# Patient Record
Sex: Female | Born: 1945
Health system: Southern US, Community
[De-identification: ages and names within clinical notes are randomized; demographics above are authoritative.]

## PROBLEM LIST (undated history)

## (undated) DIAGNOSIS — D61818 Other pancytopenia: Secondary | ICD-10-CM

## (undated) DIAGNOSIS — K219 Gastro-esophageal reflux disease without esophagitis: Secondary | ICD-10-CM

## (undated) DIAGNOSIS — R112 Nausea with vomiting, unspecified: Secondary | ICD-10-CM

## (undated) DIAGNOSIS — Z9889 Other specified postprocedural states: Secondary | ICD-10-CM

## (undated) DIAGNOSIS — M1990 Unspecified osteoarthritis, unspecified site: Secondary | ICD-10-CM

## (undated) DIAGNOSIS — R51 Headache: Secondary | ICD-10-CM

## (undated) DIAGNOSIS — N393 Stress incontinence (female) (male): Secondary | ICD-10-CM

## (undated) DIAGNOSIS — R519 Headache, unspecified: Secondary | ICD-10-CM

## (undated) DIAGNOSIS — R42 Dizziness and giddiness: Secondary | ICD-10-CM

## (undated) HISTORY — PX: TOTAL HIP ARTHROPLASTY: SHX124

## (undated) HISTORY — PX: CARPAL TUNNEL RELEASE: SHX101

## (undated) HISTORY — DX: Dizziness and giddiness: R42

## (undated) HISTORY — DX: Gastro-esophageal reflux disease without esophagitis: K21.9

## (undated) HISTORY — PX: CHOLECYSTECTOMY: SHX55

## (undated) HISTORY — DX: Other pancytopenia: D61.818

## (undated) HISTORY — DX: Unspecified osteoarthritis, unspecified site: M19.90

---

## 2011-12-01 DIAGNOSIS — Z1211 Encounter for screening for malignant neoplasm of colon: Secondary | ICD-10-CM | POA: Diagnosis not present

## 2011-12-07 DIAGNOSIS — E559 Vitamin D deficiency, unspecified: Secondary | ICD-10-CM | POA: Diagnosis not present

## 2011-12-07 DIAGNOSIS — R079 Chest pain, unspecified: Secondary | ICD-10-CM | POA: Diagnosis not present

## 2011-12-07 DIAGNOSIS — R002 Palpitations: Secondary | ICD-10-CM | POA: Diagnosis not present

## 2011-12-07 DIAGNOSIS — D509 Iron deficiency anemia, unspecified: Secondary | ICD-10-CM | POA: Diagnosis not present

## 2011-12-14 DIAGNOSIS — E559 Vitamin D deficiency, unspecified: Secondary | ICD-10-CM | POA: Diagnosis not present

## 2011-12-14 DIAGNOSIS — D509 Iron deficiency anemia, unspecified: Secondary | ICD-10-CM | POA: Diagnosis not present

## 2011-12-14 DIAGNOSIS — R7309 Other abnormal glucose: Secondary | ICD-10-CM | POA: Diagnosis not present

## 2011-12-14 DIAGNOSIS — R5383 Other fatigue: Secondary | ICD-10-CM | POA: Diagnosis not present

## 2011-12-21 DIAGNOSIS — M899 Disorder of bone, unspecified: Secondary | ICD-10-CM | POA: Diagnosis not present

## 2011-12-21 DIAGNOSIS — R32 Unspecified urinary incontinence: Secondary | ICD-10-CM | POA: Diagnosis not present

## 2011-12-21 DIAGNOSIS — M069 Rheumatoid arthritis, unspecified: Secondary | ICD-10-CM | POA: Diagnosis not present

## 2011-12-21 DIAGNOSIS — E559 Vitamin D deficiency, unspecified: Secondary | ICD-10-CM | POA: Diagnosis not present

## 2011-12-22 DIAGNOSIS — M069 Rheumatoid arthritis, unspecified: Secondary | ICD-10-CM | POA: Diagnosis not present

## 2011-12-22 DIAGNOSIS — M810 Age-related osteoporosis without current pathological fracture: Secondary | ICD-10-CM | POA: Diagnosis not present

## 2012-03-15 DIAGNOSIS — Z79899 Other long term (current) drug therapy: Secondary | ICD-10-CM | POA: Diagnosis not present

## 2012-03-15 DIAGNOSIS — M069 Rheumatoid arthritis, unspecified: Secondary | ICD-10-CM | POA: Diagnosis not present

## 2012-03-15 DIAGNOSIS — M810 Age-related osteoporosis without current pathological fracture: Secondary | ICD-10-CM | POA: Diagnosis not present

## 2012-03-15 DIAGNOSIS — K769 Liver disease, unspecified: Secondary | ICD-10-CM | POA: Diagnosis not present

## 2012-04-19 DIAGNOSIS — Z79899 Other long term (current) drug therapy: Secondary | ICD-10-CM | POA: Diagnosis not present

## 2012-04-19 DIAGNOSIS — D509 Iron deficiency anemia, unspecified: Secondary | ICD-10-CM | POA: Diagnosis not present

## 2012-04-19 DIAGNOSIS — E7210 Disorders of sulfur-bearing amino-acid metabolism, unspecified: Secondary | ICD-10-CM | POA: Diagnosis not present

## 2012-04-19 DIAGNOSIS — E559 Vitamin D deficiency, unspecified: Secondary | ICD-10-CM | POA: Diagnosis not present

## 2012-04-19 DIAGNOSIS — R7309 Other abnormal glucose: Secondary | ICD-10-CM | POA: Diagnosis not present

## 2012-05-07 DIAGNOSIS — M5430 Sciatica, unspecified side: Secondary | ICD-10-CM | POA: Diagnosis not present

## 2012-05-07 DIAGNOSIS — M25559 Pain in unspecified hip: Secondary | ICD-10-CM | POA: Diagnosis not present

## 2012-05-07 DIAGNOSIS — D696 Thrombocytopenia, unspecified: Secondary | ICD-10-CM | POA: Diagnosis not present

## 2012-05-07 DIAGNOSIS — M069 Rheumatoid arthritis, unspecified: Secondary | ICD-10-CM | POA: Diagnosis not present

## 2012-05-07 DIAGNOSIS — Z1211 Encounter for screening for malignant neoplasm of colon: Secondary | ICD-10-CM | POA: Diagnosis not present

## 2012-05-09 DIAGNOSIS — M169 Osteoarthritis of hip, unspecified: Secondary | ICD-10-CM | POA: Diagnosis not present

## 2012-05-09 DIAGNOSIS — M4310 Spondylolisthesis, site unspecified: Secondary | ICD-10-CM | POA: Diagnosis not present

## 2012-05-09 DIAGNOSIS — M25559 Pain in unspecified hip: Secondary | ICD-10-CM | POA: Diagnosis not present

## 2012-05-09 DIAGNOSIS — M545 Low back pain: Secondary | ICD-10-CM | POA: Diagnosis not present

## 2012-05-09 DIAGNOSIS — M5137 Other intervertebral disc degeneration, lumbosacral region: Secondary | ICD-10-CM | POA: Diagnosis not present

## 2012-05-15 DIAGNOSIS — K7689 Other specified diseases of liver: Secondary | ICD-10-CM | POA: Diagnosis not present

## 2012-05-15 DIAGNOSIS — N289 Disorder of kidney and ureter, unspecified: Secondary | ICD-10-CM | POA: Diagnosis not present

## 2012-05-29 DIAGNOSIS — N289 Disorder of kidney and ureter, unspecified: Secondary | ICD-10-CM | POA: Diagnosis not present

## 2012-05-29 DIAGNOSIS — K449 Diaphragmatic hernia without obstruction or gangrene: Secondary | ICD-10-CM | POA: Diagnosis not present

## 2012-05-29 DIAGNOSIS — R935 Abnormal findings on diagnostic imaging of other abdominal regions, including retroperitoneum: Secondary | ICD-10-CM | POA: Diagnosis not present

## 2012-06-14 DIAGNOSIS — M810 Age-related osteoporosis without current pathological fracture: Secondary | ICD-10-CM | POA: Diagnosis not present

## 2012-06-14 DIAGNOSIS — M069 Rheumatoid arthritis, unspecified: Secondary | ICD-10-CM | POA: Diagnosis not present

## 2012-06-29 DIAGNOSIS — M549 Dorsalgia, unspecified: Secondary | ICD-10-CM | POA: Diagnosis not present

## 2012-06-29 DIAGNOSIS — N393 Stress incontinence (female) (male): Secondary | ICD-10-CM | POA: Diagnosis not present

## 2012-06-29 DIAGNOSIS — N318 Other neuromuscular dysfunction of bladder: Secondary | ICD-10-CM | POA: Diagnosis not present

## 2012-06-29 DIAGNOSIS — R3129 Other microscopic hematuria: Secondary | ICD-10-CM | POA: Diagnosis not present

## 2012-07-06 DIAGNOSIS — R3129 Other microscopic hematuria: Secondary | ICD-10-CM | POA: Diagnosis not present

## 2012-07-06 DIAGNOSIS — R350 Frequency of micturition: Secondary | ICD-10-CM | POA: Diagnosis not present

## 2012-07-06 DIAGNOSIS — N393 Stress incontinence (female) (male): Secondary | ICD-10-CM | POA: Diagnosis not present

## 2012-07-20 DIAGNOSIS — N393 Stress incontinence (female) (male): Secondary | ICD-10-CM | POA: Diagnosis not present

## 2012-07-20 DIAGNOSIS — R350 Frequency of micturition: Secondary | ICD-10-CM | POA: Diagnosis not present

## 2012-07-20 DIAGNOSIS — R3129 Other microscopic hematuria: Secondary | ICD-10-CM | POA: Diagnosis not present

## 2012-07-20 DIAGNOSIS — N390 Urinary tract infection, site not specified: Secondary | ICD-10-CM | POA: Diagnosis not present

## 2012-08-06 DIAGNOSIS — E7210 Disorders of sulfur-bearing amino-acid metabolism, unspecified: Secondary | ICD-10-CM | POA: Diagnosis not present

## 2012-08-06 DIAGNOSIS — E538 Deficiency of other specified B group vitamins: Secondary | ICD-10-CM | POA: Diagnosis not present

## 2012-08-06 DIAGNOSIS — R7309 Other abnormal glucose: Secondary | ICD-10-CM | POA: Diagnosis not present

## 2012-08-06 DIAGNOSIS — D509 Iron deficiency anemia, unspecified: Secondary | ICD-10-CM | POA: Diagnosis not present

## 2012-08-06 DIAGNOSIS — D696 Thrombocytopenia, unspecified: Secondary | ICD-10-CM | POA: Diagnosis not present

## 2012-08-06 DIAGNOSIS — Z79899 Other long term (current) drug therapy: Secondary | ICD-10-CM | POA: Diagnosis not present

## 2012-08-07 DIAGNOSIS — N3020 Other chronic cystitis without hematuria: Secondary | ICD-10-CM | POA: Diagnosis not present

## 2012-08-07 DIAGNOSIS — R350 Frequency of micturition: Secondary | ICD-10-CM | POA: Diagnosis not present

## 2012-08-07 DIAGNOSIS — N393 Stress incontinence (female) (male): Secondary | ICD-10-CM | POA: Diagnosis not present

## 2012-08-07 DIAGNOSIS — N318 Other neuromuscular dysfunction of bladder: Secondary | ICD-10-CM | POA: Diagnosis not present

## 2012-08-20 DIAGNOSIS — D509 Iron deficiency anemia, unspecified: Secondary | ICD-10-CM | POA: Diagnosis not present

## 2012-08-20 DIAGNOSIS — R030 Elevated blood-pressure reading, without diagnosis of hypertension: Secondary | ICD-10-CM | POA: Diagnosis not present

## 2012-08-20 DIAGNOSIS — M069 Rheumatoid arthritis, unspecified: Secondary | ICD-10-CM | POA: Diagnosis not present

## 2012-08-20 DIAGNOSIS — Z23 Encounter for immunization: Secondary | ICD-10-CM | POA: Diagnosis not present

## 2012-08-20 DIAGNOSIS — D696 Thrombocytopenia, unspecified: Secondary | ICD-10-CM | POA: Diagnosis not present

## 2012-08-20 DIAGNOSIS — E559 Vitamin D deficiency, unspecified: Secondary | ICD-10-CM | POA: Diagnosis not present

## 2012-09-14 DIAGNOSIS — M810 Age-related osteoporosis without current pathological fracture: Secondary | ICD-10-CM | POA: Diagnosis not present

## 2012-09-14 DIAGNOSIS — M069 Rheumatoid arthritis, unspecified: Secondary | ICD-10-CM | POA: Diagnosis not present

## 2012-09-17 DIAGNOSIS — M810 Age-related osteoporosis without current pathological fracture: Secondary | ICD-10-CM | POA: Diagnosis not present

## 2012-09-17 DIAGNOSIS — M069 Rheumatoid arthritis, unspecified: Secondary | ICD-10-CM | POA: Diagnosis not present

## 2012-10-03 DIAGNOSIS — Z6841 Body Mass Index (BMI) 40.0 and over, adult: Secondary | ICD-10-CM | POA: Diagnosis not present

## 2012-10-03 DIAGNOSIS — M069 Rheumatoid arthritis, unspecified: Secondary | ICD-10-CM | POA: Diagnosis not present

## 2012-10-03 DIAGNOSIS — R32 Unspecified urinary incontinence: Secondary | ICD-10-CM | POA: Diagnosis not present

## 2012-10-03 DIAGNOSIS — M948X9 Other specified disorders of cartilage, unspecified sites: Secondary | ICD-10-CM | POA: Diagnosis not present

## 2012-10-03 DIAGNOSIS — E0500 Thyrotoxicosis with diffuse goiter without thyrotoxic crisis or storm: Secondary | ICD-10-CM | POA: Diagnosis not present

## 2012-10-03 DIAGNOSIS — R7309 Other abnormal glucose: Secondary | ICD-10-CM | POA: Diagnosis not present

## 2012-10-05 DIAGNOSIS — E0500 Thyrotoxicosis with diffuse goiter without thyrotoxic crisis or storm: Secondary | ICD-10-CM | POA: Diagnosis not present

## 2012-10-22 DIAGNOSIS — E0500 Thyrotoxicosis with diffuse goiter without thyrotoxic crisis or storm: Secondary | ICD-10-CM | POA: Diagnosis not present

## 2012-10-22 DIAGNOSIS — M948X9 Other specified disorders of cartilage, unspecified sites: Secondary | ICD-10-CM | POA: Diagnosis not present

## 2012-10-22 DIAGNOSIS — M069 Rheumatoid arthritis, unspecified: Secondary | ICD-10-CM | POA: Diagnosis not present

## 2012-10-22 DIAGNOSIS — R7309 Other abnormal glucose: Secondary | ICD-10-CM | POA: Diagnosis not present

## 2012-10-22 DIAGNOSIS — R32 Unspecified urinary incontinence: Secondary | ICD-10-CM | POA: Diagnosis not present

## 2012-10-23 DIAGNOSIS — E0500 Thyrotoxicosis with diffuse goiter without thyrotoxic crisis or storm: Secondary | ICD-10-CM | POA: Diagnosis not present

## 2012-11-06 DIAGNOSIS — M810 Age-related osteoporosis without current pathological fracture: Secondary | ICD-10-CM | POA: Diagnosis not present

## 2012-11-06 DIAGNOSIS — M069 Rheumatoid arthritis, unspecified: Secondary | ICD-10-CM | POA: Diagnosis not present

## 2012-11-06 DIAGNOSIS — K769 Liver disease, unspecified: Secondary | ICD-10-CM | POA: Diagnosis not present

## 2012-11-06 DIAGNOSIS — Z79899 Other long term (current) drug therapy: Secondary | ICD-10-CM | POA: Diagnosis not present

## 2012-12-05 DIAGNOSIS — R3129 Other microscopic hematuria: Secondary | ICD-10-CM | POA: Diagnosis not present

## 2012-12-05 DIAGNOSIS — R350 Frequency of micturition: Secondary | ICD-10-CM | POA: Diagnosis not present

## 2012-12-05 DIAGNOSIS — N281 Cyst of kidney, acquired: Secondary | ICD-10-CM | POA: Diagnosis not present

## 2012-12-05 DIAGNOSIS — A0223 Salmonella arthritis: Secondary | ICD-10-CM | POA: Diagnosis not present

## 2012-12-05 DIAGNOSIS — N3020 Other chronic cystitis without hematuria: Secondary | ICD-10-CM | POA: Diagnosis not present

## 2012-12-05 DIAGNOSIS — D3000 Benign neoplasm of unspecified kidney: Secondary | ICD-10-CM | POA: Diagnosis not present

## 2012-12-05 DIAGNOSIS — N318 Other neuromuscular dysfunction of bladder: Secondary | ICD-10-CM | POA: Diagnosis not present

## 2012-12-13 DIAGNOSIS — M1610 Unilateral primary osteoarthritis, unspecified hip: Secondary | ICD-10-CM | POA: Diagnosis not present

## 2012-12-31 DIAGNOSIS — E042 Nontoxic multinodular goiter: Secondary | ICD-10-CM | POA: Diagnosis not present

## 2012-12-31 DIAGNOSIS — E018 Other iodine-deficiency related thyroid disorders and allied conditions: Secondary | ICD-10-CM | POA: Diagnosis not present

## 2013-01-28 DIAGNOSIS — E0500 Thyrotoxicosis with diffuse goiter without thyrotoxic crisis or storm: Secondary | ICD-10-CM | POA: Diagnosis not present

## 2013-01-28 DIAGNOSIS — R7309 Other abnormal glucose: Secondary | ICD-10-CM | POA: Diagnosis not present

## 2013-01-28 DIAGNOSIS — R32 Unspecified urinary incontinence: Secondary | ICD-10-CM | POA: Diagnosis not present

## 2013-01-28 DIAGNOSIS — M948X9 Other specified disorders of cartilage, unspecified sites: Secondary | ICD-10-CM | POA: Diagnosis not present

## 2013-01-28 DIAGNOSIS — M069 Rheumatoid arthritis, unspecified: Secondary | ICD-10-CM | POA: Diagnosis not present

## 2013-02-06 DIAGNOSIS — M069 Rheumatoid arthritis, unspecified: Secondary | ICD-10-CM | POA: Diagnosis not present

## 2013-02-06 DIAGNOSIS — M810 Age-related osteoporosis without current pathological fracture: Secondary | ICD-10-CM | POA: Diagnosis not present

## 2013-02-06 DIAGNOSIS — K769 Liver disease, unspecified: Secondary | ICD-10-CM | POA: Diagnosis not present

## 2013-02-06 DIAGNOSIS — Z79899 Other long term (current) drug therapy: Secondary | ICD-10-CM | POA: Diagnosis not present

## 2013-02-11 DIAGNOSIS — E042 Nontoxic multinodular goiter: Secondary | ICD-10-CM | POA: Diagnosis not present

## 2013-02-18 DIAGNOSIS — R7309 Other abnormal glucose: Secondary | ICD-10-CM | POA: Diagnosis not present

## 2013-02-18 DIAGNOSIS — E538 Deficiency of other specified B group vitamins: Secondary | ICD-10-CM | POA: Diagnosis not present

## 2013-02-18 DIAGNOSIS — E559 Vitamin D deficiency, unspecified: Secondary | ICD-10-CM | POA: Diagnosis not present

## 2013-02-18 DIAGNOSIS — E0590 Thyrotoxicosis, unspecified without thyrotoxic crisis or storm: Secondary | ICD-10-CM | POA: Diagnosis not present

## 2013-02-20 DIAGNOSIS — R7309 Other abnormal glucose: Secondary | ICD-10-CM | POA: Diagnosis not present

## 2013-02-20 DIAGNOSIS — M948X9 Other specified disorders of cartilage, unspecified sites: Secondary | ICD-10-CM | POA: Diagnosis not present

## 2013-02-20 DIAGNOSIS — R32 Unspecified urinary incontinence: Secondary | ICD-10-CM | POA: Diagnosis not present

## 2013-02-20 DIAGNOSIS — E0500 Thyrotoxicosis with diffuse goiter without thyrotoxic crisis or storm: Secondary | ICD-10-CM | POA: Diagnosis not present

## 2013-02-20 DIAGNOSIS — M069 Rheumatoid arthritis, unspecified: Secondary | ICD-10-CM | POA: Diagnosis not present

## 2013-02-20 DIAGNOSIS — E042 Nontoxic multinodular goiter: Secondary | ICD-10-CM | POA: Diagnosis not present

## 2013-03-04 DIAGNOSIS — N393 Stress incontinence (female) (male): Secondary | ICD-10-CM | POA: Diagnosis not present

## 2013-03-04 DIAGNOSIS — E0590 Thyrotoxicosis, unspecified without thyrotoxic crisis or storm: Secondary | ICD-10-CM | POA: Diagnosis not present

## 2013-03-04 DIAGNOSIS — M25579 Pain in unspecified ankle and joints of unspecified foot: Secondary | ICD-10-CM | POA: Diagnosis not present

## 2013-03-04 DIAGNOSIS — E8809 Other disorders of plasma-protein metabolism, not elsewhere classified: Secondary | ICD-10-CM | POA: Diagnosis not present

## 2013-03-04 DIAGNOSIS — D696 Thrombocytopenia, unspecified: Secondary | ICD-10-CM | POA: Diagnosis not present

## 2013-03-04 DIAGNOSIS — M069 Rheumatoid arthritis, unspecified: Secondary | ICD-10-CM | POA: Diagnosis not present

## 2013-03-04 DIAGNOSIS — D72819 Decreased white blood cell count, unspecified: Secondary | ICD-10-CM | POA: Diagnosis not present

## 2013-03-04 DIAGNOSIS — Z6838 Body mass index (BMI) 38.0-38.9, adult: Secondary | ICD-10-CM | POA: Diagnosis not present

## 2013-03-04 DIAGNOSIS — R040 Epistaxis: Secondary | ICD-10-CM | POA: Diagnosis not present

## 2013-03-05 DIAGNOSIS — M7730 Calcaneal spur, unspecified foot: Secondary | ICD-10-CM | POA: Diagnosis not present

## 2013-03-05 DIAGNOSIS — M2010 Hallux valgus (acquired), unspecified foot: Secondary | ICD-10-CM | POA: Diagnosis not present

## 2013-03-05 DIAGNOSIS — M79609 Pain in unspecified limb: Secondary | ICD-10-CM | POA: Diagnosis not present

## 2013-03-05 DIAGNOSIS — M19079 Primary osteoarthritis, unspecified ankle and foot: Secondary | ICD-10-CM | POA: Diagnosis not present

## 2013-03-12 DIAGNOSIS — Z1211 Encounter for screening for malignant neoplasm of colon: Secondary | ICD-10-CM | POA: Diagnosis not present

## 2013-03-19 DIAGNOSIS — D696 Thrombocytopenia, unspecified: Secondary | ICD-10-CM | POA: Diagnosis not present

## 2013-03-25 DIAGNOSIS — D696 Thrombocytopenia, unspecified: Secondary | ICD-10-CM | POA: Diagnosis not present

## 2013-03-29 DIAGNOSIS — D696 Thrombocytopenia, unspecified: Secondary | ICD-10-CM | POA: Diagnosis not present

## 2013-04-01 DIAGNOSIS — K648 Other hemorrhoids: Secondary | ICD-10-CM | POA: Diagnosis not present

## 2013-04-01 DIAGNOSIS — Z1211 Encounter for screening for malignant neoplasm of colon: Secondary | ICD-10-CM | POA: Diagnosis not present

## 2013-05-09 DIAGNOSIS — M810 Age-related osteoporosis without current pathological fracture: Secondary | ICD-10-CM | POA: Diagnosis not present

## 2013-05-09 DIAGNOSIS — Z79899 Other long term (current) drug therapy: Secondary | ICD-10-CM | POA: Diagnosis not present

## 2013-05-09 DIAGNOSIS — M069 Rheumatoid arthritis, unspecified: Secondary | ICD-10-CM | POA: Diagnosis not present

## 2013-05-09 DIAGNOSIS — G5600 Carpal tunnel syndrome, unspecified upper limb: Secondary | ICD-10-CM | POA: Diagnosis not present

## 2013-05-29 DIAGNOSIS — R3129 Other microscopic hematuria: Secondary | ICD-10-CM | POA: Diagnosis not present

## 2013-05-29 DIAGNOSIS — N318 Other neuromuscular dysfunction of bladder: Secondary | ICD-10-CM | POA: Diagnosis not present

## 2013-05-29 DIAGNOSIS — N281 Cyst of kidney, acquired: Secondary | ICD-10-CM | POA: Diagnosis not present

## 2013-05-29 DIAGNOSIS — N3020 Other chronic cystitis without hematuria: Secondary | ICD-10-CM | POA: Diagnosis not present

## 2013-05-29 DIAGNOSIS — N393 Stress incontinence (female) (male): Secondary | ICD-10-CM | POA: Diagnosis not present

## 2013-05-29 DIAGNOSIS — R350 Frequency of micturition: Secondary | ICD-10-CM | POA: Diagnosis not present

## 2013-07-09 DIAGNOSIS — M810 Age-related osteoporosis without current pathological fracture: Secondary | ICD-10-CM | POA: Diagnosis not present

## 2013-07-09 DIAGNOSIS — M069 Rheumatoid arthritis, unspecified: Secondary | ICD-10-CM | POA: Diagnosis not present

## 2013-07-26 DIAGNOSIS — G5600 Carpal tunnel syndrome, unspecified upper limb: Secondary | ICD-10-CM | POA: Diagnosis not present

## 2013-07-26 DIAGNOSIS — Z01818 Encounter for other preprocedural examination: Secondary | ICD-10-CM | POA: Diagnosis not present

## 2013-07-26 DIAGNOSIS — E119 Type 2 diabetes mellitus without complications: Secondary | ICD-10-CM | POA: Diagnosis not present

## 2013-07-26 DIAGNOSIS — M65839 Other synovitis and tenosynovitis, unspecified forearm: Secondary | ICD-10-CM | POA: Diagnosis not present

## 2013-07-26 DIAGNOSIS — M19049 Primary osteoarthritis, unspecified hand: Secondary | ICD-10-CM | POA: Diagnosis not present

## 2013-08-01 DIAGNOSIS — G5600 Carpal tunnel syndrome, unspecified upper limb: Secondary | ICD-10-CM | POA: Diagnosis not present

## 2013-08-01 DIAGNOSIS — R209 Unspecified disturbances of skin sensation: Secondary | ICD-10-CM | POA: Diagnosis not present

## 2013-08-05 DIAGNOSIS — E042 Nontoxic multinodular goiter: Secondary | ICD-10-CM | POA: Diagnosis not present

## 2013-08-05 DIAGNOSIS — E0500 Thyrotoxicosis with diffuse goiter without thyrotoxic crisis or storm: Secondary | ICD-10-CM | POA: Diagnosis not present

## 2013-08-09 DIAGNOSIS — M810 Age-related osteoporosis without current pathological fracture: Secondary | ICD-10-CM | POA: Diagnosis not present

## 2013-08-09 DIAGNOSIS — M359 Systemic involvement of connective tissue, unspecified: Secondary | ICD-10-CM | POA: Diagnosis not present

## 2013-08-09 DIAGNOSIS — G5600 Carpal tunnel syndrome, unspecified upper limb: Secondary | ICD-10-CM | POA: Diagnosis not present

## 2013-08-09 DIAGNOSIS — M19049 Primary osteoarthritis, unspecified hand: Secondary | ICD-10-CM | POA: Diagnosis not present

## 2013-08-09 DIAGNOSIS — M069 Rheumatoid arthritis, unspecified: Secondary | ICD-10-CM | POA: Diagnosis not present

## 2013-08-09 DIAGNOSIS — M65839 Other synovitis and tenosynovitis, unspecified forearm: Secondary | ICD-10-CM | POA: Diagnosis not present

## 2013-08-14 DIAGNOSIS — E0500 Thyrotoxicosis with diffuse goiter without thyrotoxic crisis or storm: Secondary | ICD-10-CM | POA: Diagnosis not present

## 2013-08-14 DIAGNOSIS — R32 Unspecified urinary incontinence: Secondary | ICD-10-CM | POA: Diagnosis not present

## 2013-08-14 DIAGNOSIS — M948X9 Other specified disorders of cartilage, unspecified sites: Secondary | ICD-10-CM | POA: Diagnosis not present

## 2013-08-14 DIAGNOSIS — E042 Nontoxic multinodular goiter: Secondary | ICD-10-CM | POA: Diagnosis not present

## 2013-08-14 DIAGNOSIS — R7309 Other abnormal glucose: Secondary | ICD-10-CM | POA: Diagnosis not present

## 2013-08-14 DIAGNOSIS — M069 Rheumatoid arthritis, unspecified: Secondary | ICD-10-CM | POA: Diagnosis not present

## 2013-08-16 DIAGNOSIS — Z23 Encounter for immunization: Secondary | ICD-10-CM | POA: Diagnosis not present

## 2013-08-30 DIAGNOSIS — M19049 Primary osteoarthritis, unspecified hand: Secondary | ICD-10-CM | POA: Diagnosis not present

## 2013-08-30 DIAGNOSIS — M65839 Other synovitis and tenosynovitis, unspecified forearm: Secondary | ICD-10-CM | POA: Diagnosis not present

## 2013-08-30 DIAGNOSIS — G5600 Carpal tunnel syndrome, unspecified upper limb: Secondary | ICD-10-CM | POA: Diagnosis not present

## 2013-09-03 DIAGNOSIS — E539 Vitamin B deficiency, unspecified: Secondary | ICD-10-CM | POA: Diagnosis not present

## 2013-09-03 DIAGNOSIS — E538 Deficiency of other specified B group vitamins: Secondary | ICD-10-CM | POA: Diagnosis not present

## 2013-09-03 DIAGNOSIS — E559 Vitamin D deficiency, unspecified: Secondary | ICD-10-CM | POA: Diagnosis not present

## 2013-09-03 DIAGNOSIS — E8809 Other disorders of plasma-protein metabolism, not elsewhere classified: Secondary | ICD-10-CM | POA: Diagnosis not present

## 2013-09-03 DIAGNOSIS — R7309 Other abnormal glucose: Secondary | ICD-10-CM | POA: Diagnosis not present

## 2013-09-03 DIAGNOSIS — E0590 Thyrotoxicosis, unspecified without thyrotoxic crisis or storm: Secondary | ICD-10-CM | POA: Diagnosis not present

## 2013-09-03 DIAGNOSIS — M25579 Pain in unspecified ankle and joints of unspecified foot: Secondary | ICD-10-CM | POA: Diagnosis not present

## 2013-09-04 DIAGNOSIS — M810 Age-related osteoporosis without current pathological fracture: Secondary | ICD-10-CM | POA: Diagnosis not present

## 2013-09-04 DIAGNOSIS — M069 Rheumatoid arthritis, unspecified: Secondary | ICD-10-CM | POA: Diagnosis not present

## 2013-09-17 DIAGNOSIS — D509 Iron deficiency anemia, unspecified: Secondary | ICD-10-CM | POA: Diagnosis not present

## 2013-09-17 DIAGNOSIS — E0590 Thyrotoxicosis, unspecified without thyrotoxic crisis or storm: Secondary | ICD-10-CM | POA: Diagnosis not present

## 2013-09-17 DIAGNOSIS — K219 Gastro-esophageal reflux disease without esophagitis: Secondary | ICD-10-CM | POA: Diagnosis not present

## 2013-09-17 DIAGNOSIS — M069 Rheumatoid arthritis, unspecified: Secondary | ICD-10-CM | POA: Diagnosis not present

## 2013-09-17 DIAGNOSIS — D696 Thrombocytopenia, unspecified: Secondary | ICD-10-CM | POA: Diagnosis not present

## 2013-09-17 DIAGNOSIS — R609 Edema, unspecified: Secondary | ICD-10-CM | POA: Diagnosis not present

## 2013-10-03 DIAGNOSIS — M79609 Pain in unspecified limb: Secondary | ICD-10-CM | POA: Diagnosis not present

## 2013-10-03 DIAGNOSIS — M7120 Synovial cyst of popliteal space [Baker], unspecified knee: Secondary | ICD-10-CM | POA: Diagnosis not present

## 2013-10-03 DIAGNOSIS — M25569 Pain in unspecified knee: Secondary | ICD-10-CM | POA: Diagnosis not present

## 2013-10-04 DIAGNOSIS — D7589 Other specified diseases of blood and blood-forming organs: Secondary | ICD-10-CM | POA: Diagnosis not present

## 2013-10-04 DIAGNOSIS — D696 Thrombocytopenia, unspecified: Secondary | ICD-10-CM | POA: Diagnosis not present

## 2013-10-04 DIAGNOSIS — R5381 Other malaise: Secondary | ICD-10-CM | POA: Diagnosis not present

## 2013-10-17 DIAGNOSIS — M810 Age-related osteoporosis without current pathological fracture: Secondary | ICD-10-CM | POA: Diagnosis not present

## 2013-10-17 DIAGNOSIS — M069 Rheumatoid arthritis, unspecified: Secondary | ICD-10-CM | POA: Diagnosis not present

## 2014-03-13 DIAGNOSIS — K219 Gastro-esophageal reflux disease without esophagitis: Secondary | ICD-10-CM | POA: Diagnosis not present

## 2014-03-13 DIAGNOSIS — M069 Rheumatoid arthritis, unspecified: Secondary | ICD-10-CM | POA: Diagnosis not present

## 2014-03-13 DIAGNOSIS — I509 Heart failure, unspecified: Secondary | ICD-10-CM | POA: Diagnosis not present

## 2014-03-13 DIAGNOSIS — M25569 Pain in unspecified knee: Secondary | ICD-10-CM | POA: Diagnosis not present

## 2014-04-28 DIAGNOSIS — M25569 Pain in unspecified knee: Secondary | ICD-10-CM | POA: Diagnosis not present

## 2014-04-28 DIAGNOSIS — Z0000 Encounter for general adult medical examination without abnormal findings: Secondary | ICD-10-CM | POA: Diagnosis not present

## 2014-04-28 DIAGNOSIS — I509 Heart failure, unspecified: Secondary | ICD-10-CM | POA: Diagnosis not present

## 2014-04-28 DIAGNOSIS — E785 Hyperlipidemia, unspecified: Secondary | ICD-10-CM | POA: Diagnosis not present

## 2014-04-28 DIAGNOSIS — M069 Rheumatoid arthritis, unspecified: Secondary | ICD-10-CM | POA: Diagnosis not present

## 2014-04-28 DIAGNOSIS — N390 Urinary tract infection, site not specified: Secondary | ICD-10-CM | POA: Diagnosis not present

## 2014-05-05 DIAGNOSIS — D709 Neutropenia, unspecified: Secondary | ICD-10-CM | POA: Diagnosis not present

## 2014-05-05 DIAGNOSIS — I509 Heart failure, unspecified: Secondary | ICD-10-CM | POA: Diagnosis not present

## 2014-05-05 DIAGNOSIS — K219 Gastro-esophageal reflux disease without esophagitis: Secondary | ICD-10-CM | POA: Diagnosis not present

## 2014-05-05 DIAGNOSIS — E785 Hyperlipidemia, unspecified: Secondary | ICD-10-CM | POA: Diagnosis not present

## 2014-05-13 DIAGNOSIS — M949 Disorder of cartilage, unspecified: Secondary | ICD-10-CM | POA: Diagnosis not present

## 2014-05-13 DIAGNOSIS — M25569 Pain in unspecified knee: Secondary | ICD-10-CM | POA: Diagnosis not present

## 2014-05-13 DIAGNOSIS — M899 Disorder of bone, unspecified: Secondary | ICD-10-CM | POA: Diagnosis not present

## 2014-05-13 DIAGNOSIS — M069 Rheumatoid arthritis, unspecified: Secondary | ICD-10-CM | POA: Diagnosis not present

## 2014-06-05 ENCOUNTER — Other Ambulatory Visit: Payer: Self-pay | Admitting: Rheumatology

## 2014-06-05 DIAGNOSIS — M25562 Pain in left knee: Secondary | ICD-10-CM

## 2014-06-05 DIAGNOSIS — M25561 Pain in right knee: Secondary | ICD-10-CM

## 2014-06-06 ENCOUNTER — Ambulatory Visit
Admission: RE | Admit: 2014-06-06 | Discharge: 2014-06-06 | Disposition: A | Discharge status: Home / Self Care | Payer: Medicare Other | Source: Ambulatory Visit | Attending: Rheumatology | Admitting: Rheumatology

## 2014-06-06 DIAGNOSIS — M25561 Pain in right knee: Secondary | ICD-10-CM

## 2014-06-06 DIAGNOSIS — M25469 Effusion, unspecified knee: Secondary | ICD-10-CM | POA: Diagnosis not present

## 2014-07-01 DIAGNOSIS — M1710 Unilateral primary osteoarthritis, unspecified knee: Secondary | ICD-10-CM | POA: Diagnosis not present

## 2014-07-15 DIAGNOSIS — H2510 Age-related nuclear cataract, unspecified eye: Secondary | ICD-10-CM | POA: Diagnosis not present

## 2014-07-23 DIAGNOSIS — H2510 Age-related nuclear cataract, unspecified eye: Secondary | ICD-10-CM | POA: Diagnosis not present

## 2014-07-29 HISTORY — PX: EYE SURGERY: SHX253

## 2014-07-30 DIAGNOSIS — H2510 Age-related nuclear cataract, unspecified eye: Secondary | ICD-10-CM | POA: Diagnosis not present

## 2014-08-06 DIAGNOSIS — M069 Rheumatoid arthritis, unspecified: Secondary | ICD-10-CM | POA: Diagnosis not present

## 2014-08-12 DIAGNOSIS — M25569 Pain in unspecified knee: Secondary | ICD-10-CM | POA: Diagnosis not present

## 2014-08-12 DIAGNOSIS — M1710 Unilateral primary osteoarthritis, unspecified knee: Secondary | ICD-10-CM | POA: Diagnosis not present

## 2014-08-13 DIAGNOSIS — M899 Disorder of bone, unspecified: Secondary | ICD-10-CM | POA: Diagnosis not present

## 2014-08-13 DIAGNOSIS — Z23 Encounter for immunization: Secondary | ICD-10-CM | POA: Diagnosis not present

## 2014-08-13 DIAGNOSIS — M25569 Pain in unspecified knee: Secondary | ICD-10-CM | POA: Diagnosis not present

## 2014-08-13 DIAGNOSIS — M949 Disorder of cartilage, unspecified: Secondary | ICD-10-CM | POA: Diagnosis not present

## 2014-08-13 DIAGNOSIS — M069 Rheumatoid arthritis, unspecified: Secondary | ICD-10-CM | POA: Diagnosis not present

## 2014-08-21 DIAGNOSIS — M1710 Unilateral primary osteoarthritis, unspecified knee: Secondary | ICD-10-CM | POA: Diagnosis not present

## 2014-08-29 DIAGNOSIS — M1711 Unilateral primary osteoarthritis, right knee: Secondary | ICD-10-CM | POA: Diagnosis not present

## 2014-09-03 DIAGNOSIS — M069 Rheumatoid arthritis, unspecified: Secondary | ICD-10-CM | POA: Diagnosis not present

## 2014-09-09 ENCOUNTER — Telehealth: Payer: Self-pay

## 2014-09-09 NOTE — Telephone Encounter (Signed)
Faxed pt medical records to [facility].  Pt wants to see a physician closer to home.

## 2014-09-22 ENCOUNTER — Encounter (HOSPITAL_COMMUNITY): Payer: Medicare Other | Attending: Hematology and Oncology

## 2014-09-22 ENCOUNTER — Encounter (HOSPITAL_COMMUNITY): Payer: Self-pay

## 2014-09-22 VITALS — BP 144/55 | HR 63 | Temp 98.2°F | Resp 16 | Ht 60.0 in | Wt 201.4 lb

## 2014-09-22 DIAGNOSIS — D61811 Other drug-induced pancytopenia: Secondary | ICD-10-CM | POA: Diagnosis not present

## 2014-09-22 DIAGNOSIS — D61810 Antineoplastic chemotherapy induced pancytopenia: Secondary | ICD-10-CM | POA: Diagnosis not present

## 2014-09-22 DIAGNOSIS — M25461 Effusion, right knee: Secondary | ICD-10-CM | POA: Insufficient documentation

## 2014-09-22 DIAGNOSIS — M069 Rheumatoid arthritis, unspecified: Secondary | ICD-10-CM

## 2014-09-22 DIAGNOSIS — M810 Age-related osteoporosis without current pathological fracture: Secondary | ICD-10-CM | POA: Diagnosis not present

## 2014-09-22 DIAGNOSIS — Z87891 Personal history of nicotine dependence: Secondary | ICD-10-CM | POA: Insufficient documentation

## 2014-09-22 DIAGNOSIS — Z96642 Presence of left artificial hip joint: Secondary | ICD-10-CM | POA: Diagnosis not present

## 2014-09-22 DIAGNOSIS — Z9049 Acquired absence of other specified parts of digestive tract: Secondary | ICD-10-CM | POA: Diagnosis not present

## 2014-09-22 DIAGNOSIS — T451X5D Adverse effect of antineoplastic and immunosuppressive drugs, subsequent encounter: Secondary | ICD-10-CM | POA: Insufficient documentation

## 2014-09-22 DIAGNOSIS — D61818 Other pancytopenia: Secondary | ICD-10-CM

## 2014-09-22 LAB — CBC WITH DIFFERENTIAL/PLATELET
Basophils Absolute: 0 10*3/uL (ref 0.0–0.1)
Basophils Relative: 1 % (ref 0–1)
EOS ABS: 0 10*3/uL (ref 0.0–0.7)
Eosinophils Relative: 1 % (ref 0–5)
HCT: 36.3 % (ref 36.0–46.0)
HEMOGLOBIN: 12.1 g/dL (ref 12.0–15.0)
Lymphocytes Relative: 20 % (ref 12–46)
Lymphs Abs: 0.8 10*3/uL (ref 0.7–4.0)
MCH: 32.3 pg (ref 26.0–34.0)
MCHC: 33.3 g/dL (ref 30.0–36.0)
MCV: 96.8 fL (ref 78.0–100.0)
MONOS PCT: 7 % (ref 3–12)
Monocytes Absolute: 0.2 10*3/uL (ref 0.1–1.0)
NEUTROS PCT: 71 % (ref 43–77)
Neutro Abs: 2.6 10*3/uL (ref 1.7–7.7)
Platelets: 120 10*3/uL — ABNORMAL LOW (ref 150–400)
RBC: 3.75 MIL/uL — AB (ref 3.87–5.11)
RDW: 13.9 % (ref 11.5–15.5)
WBC: 3.7 10*3/uL — ABNORMAL LOW (ref 4.0–10.5)

## 2014-09-22 LAB — COMPREHENSIVE METABOLIC PANEL
ALBUMIN: 4 g/dL (ref 3.5–5.2)
ALK PHOS: 64 U/L (ref 39–117)
ALT: 12 U/L (ref 0–35)
ANION GAP: 10 (ref 5–15)
AST: 27 U/L (ref 0–37)
BILIRUBIN TOTAL: 0.6 mg/dL (ref 0.3–1.2)
BUN: 12 mg/dL (ref 6–23)
CHLORIDE: 104 meq/L (ref 96–112)
CO2: 28 mEq/L (ref 19–32)
Calcium: 10.3 mg/dL (ref 8.4–10.5)
Creatinine, Ser: 0.86 mg/dL (ref 0.50–1.10)
GFR calc Af Amer: 79 mL/min — ABNORMAL LOW (ref >90)
GFR calc non Af Amer: 68 mL/min — ABNORMAL LOW (ref >90)
Glucose, Bld: 88 mg/dL (ref 70–99)
POTASSIUM: 3.7 meq/L (ref 3.7–5.3)
SODIUM: 142 meq/L (ref 137–147)
Total Protein: 7.3 g/dL (ref 6.0–8.3)

## 2014-09-22 LAB — RETICULOCYTES
RBC.: 3.75 MIL/uL — AB (ref 3.87–5.11)
Retic Count, Absolute: 37.5 10*3/uL (ref 19.0–186.0)
Retic Ct Pct: 1 % (ref 0.4–3.1)

## 2014-09-22 LAB — LACTATE DEHYDROGENASE: LDH: 317 U/L — ABNORMAL HIGH (ref 94–250)

## 2014-09-22 NOTE — Progress Notes (Signed)
Melissa Villanueva presented for labwork. Labs per MD order drawn via Peripheral Line 23 gauge needle inserted in right AC  Good blood return present. Procedure without incident.  Needle removed intact. Patient tolerated procedure well.

## 2014-09-22 NOTE — Progress Notes (Signed)
[place] A. Barnet Glasgow, M.D.  NEW PATIENT EVALUATION   Name: Melissa Villanueva Date: 09/23/2014 MRN: [id number] DOB: 1946-05-01  PCP: Fanny Skates, MD   REFERRING PHYSICIAN: Hennie Duos, MD  REASON FOR REFERRAL: Pancytopenia     HISTORY OF PRESENT ILLNESS:Melissa Villanueva is a 68 y.o. female who is referred by her rheumatologist for pancytopenia while on methotrexate and hydroxychloroquine sulfate for rheumatoid arthritis since 2003. Methotrexate was stopped about 3-4 weeks ago when blood counts were found to be low. Patient denies any significant bruising, frequent infections, increasing fatigue, lower extremity swelling or redness but with right knee swelling after trauma. The knee was drained and she still has persistent discomfort involving that extremity since. She was started on folic acid only 2 months ago. She denies any fever, night sweats, cough, wheezing, sore throat, lymphadenopathy, skin rash, headache, or seizures.   PAST MEDICAL HISTORY:  has a past medical history of Arthritis and GERD (gastroesophageal reflux disease).     PAST SURGICAL HISTORY: Past Surgical History  Procedure Laterality Date  . Total hip arthroplasty Left   . Cholecystectomy    . Cesarean section       CURRENT MEDICATIONS: has a current medication list which includes the following prescription(s): acetaminophen, calcium carbonate-vitamin d, celecoxib, famotidine, folic acid, glucosamine chondr 1500 complx, hydroxychloroquine, probiotic product, and hylan.   ALLERGIES: Review of patient's allergies indicates not on file.   SOCIAL HISTORY:  reports that she quit smoking about 28 years ago. She has never used smokeless tobacco. She reports that she drinks alcohol. She reports that she does not use illicit drugs.   FAMILY HISTORY: family history includes Cancer in her brother, maternal aunt, and paternal aunt.    REVIEW OF SYSTEMS:    Other than that discussed above is noncontributory.    PHYSICAL EXAM:  height is 5' (1.524 m) and weight is 201 lb 6.4 oz (91.354 kg). Her oral temperature is 98.2 F (36.8 C). Her blood pressure is 144/55 and her pulse is 63. Her respiration is 16 and oxygen saturation is 98%.    GENERAL:alert, no distress and comfortable. Moderately obese. SKIN: skin color, texture, turgor are normal, no rashes or significant lesions EYES: normal, Conjunctiva are pink and non-injected, sclera clear OROPHARYNX:no exudate, no erythema and lips, buccal mucosa, and tongue normal  NECK: supple, thyroid normal size, non-tender, without nodularity CHEST: Normal AP diameter were no breast masses. LYMPH:  no palpable lymphadenopathy in the cervical, axillary or inguinal LUNGS: clear to auscultation and percussion with normal breathing effort HEART: regular rate & rhythm and no murmurs ABDOMEN:abdomen soft, non-tender and normal bowel sounds. Obese and soft with no spleen appreciated. MUSCULOSKELETALl:no cyanosis of digits, no clubbing or edema  NEURO: alert & oriented x 3 with fluent speech, no focal motor/sensory deficits    LABORATORY DATA:  Office Visit on 09/22/2014  Component Date Value Ref Range Status  . WBC 09/22/2014 3.7* 4.0 - 10.5 K/uL Final  . RBC 09/22/2014 3.75* 3.87 - 5.11 MIL/uL Final  . Hemoglobin 09/22/2014 12.1  12.0 - 15.0 g/dL Final  . HCT 09/22/2014 36.3  36.0 - 46.0 % Final  . MCV 09/22/2014 96.8  78.0 - 100.0 fL Final  . MCH 09/22/2014 32.3  26.0 - 34.0 pg Final  . MCHC 09/22/2014 33.3  30.0 - 36.0 g/dL Final  . RDW 09/22/2014 13.9  11.5 - 15.5 % Final  . Platelets 09/22/2014 120* 150 -  400 K/uL Final  . Neutrophils Relative % 09/22/2014 71  43 - 77 % Final  . Neutro Abs 09/22/2014 2.6  1.7 - 7.7 K/uL Final  . Lymphocytes Relative 09/22/2014 20  12 - 46 % Final  . Lymphs Abs 09/22/2014 0.8  0.7 - 4.0 K/uL Final  . Monocytes Relative 09/22/2014 7  3 - 12 % Final  . Monocytes  Absolute 09/22/2014 0.2  0.1 - 1.0 K/uL Final  . Eosinophils Relative 09/22/2014 1  0 - 5 % Final  . Eosinophils Absolute 09/22/2014 0.0  0.0 - 0.7 K/uL Final  . Basophils Relative 09/22/2014 1  0 - 1 % Final  . Basophils Absolute 09/22/2014 0.0  0.0 - 0.1 K/uL Final  . Retic Ct Pct 09/22/2014 1.0  0.4 - 3.1 % Final  . RBC. 09/22/2014 3.75* 3.87 - 5.11 MIL/uL Final  . Retic Count, Manual 09/22/2014 37.5  19.0 - 186.0 K/uL Final  . Sodium 09/22/2014 142  137 - 147 mEq/L Final  . Potassium 09/22/2014 3.7  3.7 - 5.3 mEq/L Final  . Chloride 09/22/2014 104  96 - 112 mEq/L Final  . CO2 09/22/2014 28  19 - 32 mEq/L Final  . Glucose, Bld 09/22/2014 88  70 - 99 mg/dL Final  . BUN 09/22/2014 12  6 - 23 mg/dL Final  . Creatinine, Ser 09/22/2014 0.86  0.50 - 1.10 mg/dL Final  . Calcium 09/22/2014 10.3  8.4 - 10.5 mg/dL Final  . Total Protein 09/22/2014 7.3  6.0 - 8.3 g/dL Final  . Albumin 09/22/2014 4.0  3.5 - 5.2 g/dL Final  . AST 09/22/2014 27  0 - 37 U/L Final  . ALT 09/22/2014 12  0 - 35 U/L Final  . Alkaline Phosphatase 09/22/2014 64  39 - 117 U/L Final  . Total Bilirubin 09/22/2014 0.6  0.3 - 1.2 mg/dL Final  . GFR calc non Af Amer 09/22/2014 68* >90 mL/min Final  . GFR calc Af Amer 09/22/2014 79* >90 mL/min Final   Comment: (NOTE)                          The eGFR has been calculated using the CKD EPI equation.                          This calculation has not been validated in all clinical situations.                          eGFR's persistently <90 mL/min signify possible Chronic Kidney                          Disease.  . Anion gap 09/22/2014 10  5 - 15 Final  . LDH 09/22/2014 317* 94 - 250 U/L Final   SLIGHT HEMOLYSIS  . Vitamin B-12 09/22/2014 644  211 - 911 pg/mL Final   Performed at [facility]  . Folate 09/22/2014 >20.0   Final   Comment: (NOTE)                          Reference Ranges                                 Deficient:       0.4 -  3.3 ng/mL                                  Indeterminate:   3.4 - 5.4 ng/mL                                 Normal:              > 5.4 ng/mL                          Performed at [facility]   08/06/2014: WBC 2.9, hemoglobin 11.9, platelets 100,000. MCV is 98.9, 67% neutrophils.                 BUN 9, creatinine 0.9, total protein 6.5, phosphatase 63, C-reactive protein 2.8 Urinalysis No results found for this basename: colorurine,  appearanceur,  labspec,  phurine,  glucoseu,  hgbur,  bilirubinur,  ketonesur,  proteinur,  urobilinogen,  nitrite,  leukocytesur      @[email or web address] : MR Knee Right Wo Contrast Status: Final result         PACS Images    Show images for MR Knee Right Wo Contrast         Study Result    CLINICAL DATA: Right knee pain  EXAM:  MRI OF THE RIGHT KNEE WITHOUT CONTRAST  TECHNIQUE:  Multiplanar, multisequence MR imaging of the knee was performed. No  intravenous contrast was administered.  COMPARISON: None.  FINDINGS:  MENISCI  Medial meniscus: Maceration of the body of the medial meniscus.  Lateral meniscus: Intact.  LIGAMENTS  Cruciates: Intact ACL and PCL.  Collaterals: Medial collateral ligament is intact. Lateral  collateral ligament complex is intact.  CARTILAGE  Patellofemoral: Partial thickness cartilage loss of the medial and  lateral patellar facets. Partial thickness cartilage loss of the  trochlear groove.  Medial: Full-thickness cartilage loss throughout the medial femoral  condyle and medial tibial plateau.  Lateral: Cartilage irregularity without focal chondral defect.  Joint: Large joint effusion. Normal Hoffa's fat.  Popliteal Fossa: Intact popliteus tendon. No Baker cyst.  Extensor Mechanism: Intact.  Bones: 16 x 19 mm lobulated mildly T2 hyperintense medial proximal  tibial metaphysis lesion with stippled low signal likely  representing a benign chondroid lesion such as an enchondroma.  IMPRESSION:  1. Maceration of the body of the medial  meniscus  2. Tricompartmental cartilage abnormalities as described above most  severe in the medial femorotibial compartment.  3. Large joint effusion.  Electronically Signed  By: Kathreen Devoid  On: 07/     PATHOLOGY: Peripheral smear fails to reveal evidence of premature forms.   IMPRESSION:  #1. Pancytopenia setting of long-term methotrexate use, possible myelodysplastic syndrome versus Felty's syndrome. #2. Rheumatoid arthritis, on treatment. #3. Osteoporosis, on treatment. #4. Status post Hylan right knee joint injection.   PLAN:  #1. Ultrasound upper abdomen to assess spleen size. #2. Consider bone marrow aspiration and biopsy of pancytopenia persists despite discontinuation of methotrexate. Long-term use of this agent can result in myelodysplastic changes due to formation of mutations and stem cells. 3. Follow-up on 10/15/2014 with CBC.  I appreciate the opportunity of sharing in her care.   Doroteo Bradford, MD 09/23/2014 8:44 AM   DISCLAIMER:  This note was dictated with voice recognition softwre.  Similar sounding words can inadvertently be transcribed inaccurately  and may not be corrected upon review.

## 2014-09-22 NOTE — Patient Instructions (Signed)
[facility] Discharge Instructions  RECOMMENDATIONS MADE BY THE CONSULTANT AND ANY TEST RESULTS WILL BE SENT TO YOUR REFERRING PHYSICIAN.  EXAM FINDINGS BY THE PHYSICIAN TODAY AND SIGNS OR SYMPTOMS TO REPORT TO CLINIC OR PRIMARY PHYSICIAN: Exam and findings as discussed by Dr. Barnet Glasgow.  Will check some labs today and if your blood counts have not improved after being off the methotrexate we may need to get a bone marrow biopsy and aspiration.  Will also get an ultrasound of your abdomen to check the size of your spleen.    INSTRUCTIONS/FOLLOW-UP: Follow-up with labs and office visit   Thank you for choosing [facility] to provide your oncology and hematology care.  To afford each patient quality time with our providers, please arrive at least 15 minutes before your scheduled appointment time.  With your help, our goal is to use those 15 minutes to complete the necessary work-up to ensure our physicians have the information they need to help with your evaluation and healthcare recommendations.    Effective January 1st, 2014, we ask that you re-schedule your appointment with our physicians should you arrive 10 or more minutes late for your appointment.  We strive to give you quality time with our providers, and arriving late affects you and other patients whose appointments are after yours.    Again, thank you for choosing [facility].  Our hope is that these requests will decrease the amount of time that you wait before being seen by our physicians.       _____________________________________________________________  Should you have questions after your visit to [facility], please contact our office at [phone] between the hours of 8:30 a.m. and 4:30 p.m.  Voicemails left after 4:30 p.m. will not be returned until the following business day.  For prescription refill requests, have your pharmacy contact our office with your  prescription refill request.    _______________________________________________________________  We hope that we have given you very good care.  You may receive a patient satisfaction survey in the mail, please complete it and return it as soon as possible.  We value your feedback!  _______________________________________________________________  Have you asked about our STAR program?  STAR stands for Survivorship Training and Rehabilitation, and this is a nationally recognized cancer care program that focuses on survivorship and rehabilitation.  Cancer and cancer treatments may cause problems, such as, pain, making you feel tired and keeping you from doing the things that you need or want to do. Cancer rehabilitation can help. Our goal is to reduce these troubling effects and help you have the best quality of life possible.  You may receive a survey from a nurse that asks questions about your current state of health.  Based on the survey results, all eligible patients will be referred to the [facility] program for an evaluation so we can better serve you!  A frequently asked questions sheet is available upon request.

## 2014-09-23 ENCOUNTER — Other Ambulatory Visit (HOSPITAL_COMMUNITY): Payer: Self-pay | Admitting: Hematology and Oncology

## 2014-09-23 DIAGNOSIS — D61818 Other pancytopenia: Secondary | ICD-10-CM

## 2014-09-23 LAB — FOLATE: Folate: >20 ng/mL

## 2014-09-23 LAB — VITAMIN B12: Vitamin B-12: 644 pg/mL (ref 211–911)

## 2014-09-23 LAB — ANA: ANA: NEGATIVE

## 2014-09-25 ENCOUNTER — Ambulatory Visit (HOSPITAL_COMMUNITY)
Admission: RE | Admit: 2014-09-25 | Discharge: 2014-09-25 | Disposition: A | Discharge status: Home / Self Care | Payer: Medicare Other | Source: Ambulatory Visit | Attending: Hematology and Oncology | Admitting: Hematology and Oncology

## 2014-09-25 ENCOUNTER — Encounter (HOSPITAL_COMMUNITY): Payer: Self-pay | Admitting: Pharmacy Technician

## 2014-09-25 ENCOUNTER — Other Ambulatory Visit: Payer: Self-pay | Admitting: Radiology

## 2014-09-25 DIAGNOSIS — M810 Age-related osteoporosis without current pathological fracture: Secondary | ICD-10-CM | POA: Insufficient documentation

## 2014-09-25 DIAGNOSIS — D61818 Other pancytopenia: Secondary | ICD-10-CM | POA: Diagnosis not present

## 2014-09-25 DIAGNOSIS — M069 Rheumatoid arthritis, unspecified: Secondary | ICD-10-CM | POA: Insufficient documentation

## 2014-09-25 DIAGNOSIS — Z87891 Personal history of nicotine dependence: Secondary | ICD-10-CM | POA: Insufficient documentation

## 2014-09-26 ENCOUNTER — Other Ambulatory Visit: Payer: Self-pay | Admitting: Radiology

## 2014-09-29 ENCOUNTER — Ambulatory Visit (HOSPITAL_COMMUNITY)
Admission: RE | Admit: 2014-09-29 | Discharge: 2014-09-29 | Disposition: A | Discharge status: Home / Self Care | Payer: Medicare Other | Source: Ambulatory Visit | Attending: Hematology and Oncology | Admitting: Hematology and Oncology

## 2014-09-29 ENCOUNTER — Encounter (HOSPITAL_COMMUNITY): Payer: Self-pay

## 2014-09-29 DIAGNOSIS — K219 Gastro-esophageal reflux disease without esophagitis: Secondary | ICD-10-CM | POA: Diagnosis not present

## 2014-09-29 DIAGNOSIS — D72819 Decreased white blood cell count, unspecified: Secondary | ICD-10-CM | POA: Diagnosis not present

## 2014-09-29 DIAGNOSIS — Z87891 Personal history of nicotine dependence: Secondary | ICD-10-CM | POA: Insufficient documentation

## 2014-09-29 DIAGNOSIS — D696 Thrombocytopenia, unspecified: Secondary | ICD-10-CM | POA: Diagnosis not present

## 2014-09-29 DIAGNOSIS — Z96642 Presence of left artificial hip joint: Secondary | ICD-10-CM | POA: Insufficient documentation

## 2014-09-29 DIAGNOSIS — Z79899 Other long term (current) drug therapy: Secondary | ICD-10-CM | POA: Insufficient documentation

## 2014-09-29 DIAGNOSIS — Z809 Family history of malignant neoplasm, unspecified: Secondary | ICD-10-CM | POA: Diagnosis not present

## 2014-09-29 DIAGNOSIS — D61818 Other pancytopenia: Secondary | ICD-10-CM | POA: Diagnosis not present

## 2014-09-29 DIAGNOSIS — M069 Rheumatoid arthritis, unspecified: Secondary | ICD-10-CM | POA: Insufficient documentation

## 2014-09-29 LAB — CBC
HCT: 37.3 % (ref 36.0–46.0)
Hemoglobin: 12.5 g/dL (ref 12.0–15.0)
MCH: 32.3 pg (ref 26.0–34.0)
MCHC: 33.5 g/dL (ref 30.0–36.0)
MCV: 96.4 fL (ref 78.0–100.0)
Platelets: 117 10*3/uL — ABNORMAL LOW (ref 150–400)
RBC: 3.87 MIL/uL (ref 3.87–5.11)
RDW: 13.6 % (ref 11.5–15.5)
WBC: 3.4 10*3/uL — ABNORMAL LOW (ref 4.0–10.5)

## 2014-09-29 LAB — PROTIME-INR
INR: 1.02 (ref 0.00–1.49)
PROTHROMBIN TIME: 13.5 s (ref 11.6–15.2)

## 2014-09-29 LAB — APTT: APTT: 32 s (ref 24–37)

## 2014-09-29 MED ORDER — FENTANYL CITRATE 0.05 MG/ML IJ SOLN
INTRAMUSCULAR | Status: AC | PRN
  Administered 2014-09-29 (×2): 50 ug via INTRAVENOUS

## 2014-09-29 MED ORDER — MIDAZOLAM HCL 5 MG/5ML IJ SOLN
INTRAMUSCULAR | Status: AC | PRN
  Administered 2014-09-29: 1 mg via INTRAVENOUS

## 2014-09-29 MED ORDER — MIDAZOLAM HCL 2 MG/2ML IJ SOLN
INTRAMUSCULAR | Status: AC
  Filled 2014-09-29: qty 4

## 2014-09-29 MED ORDER — SODIUM CHLORIDE 0.9 % IV SOLN
Freq: Once | INTRAVENOUS | Status: AC
  Administered 2014-09-29: 08:00:00 via INTRAVENOUS

## 2014-09-29 MED ORDER — FENTANYL CITRATE 0.05 MG/ML IJ SOLN
INTRAMUSCULAR | Status: AC
  Filled 2014-09-29: qty 4

## 2014-09-29 MED ORDER — MIDAZOLAM HCL 2 MG/2ML IJ SOLN
INTRAMUSCULAR | Status: AC | PRN
  Administered 2014-09-29: 1 mg via INTRAVENOUS

## 2014-09-29 NOTE — H&P (Signed)
Chief Complaint: "I'm having a biopsy of my bone"   Referring Physician(s): Formanek,Gregory  History of Present Illness: Melissa Villanueva is a 68 y.o. female with history of pancytopenia while on methotrexate and hydroxychloroquine sulfate for rheumatoid arthritis. She presents today for CT guided bone marrow biopsy for further evaluation.  Past Medical History  Diagnosis Date  . Arthritis   . GERD (gastroesophageal reflux disease)     Past Surgical History  Procedure Laterality Date  . Total hip arthroplasty Left   . Cholecystectomy    . Cesarean section    . Eye surgery  Sept 2015    Bilateral Glaucoma with surgery    Allergies: Review of patient's allergies indicates no known allergies.  Medications: Prior to Admission medications   Medication Sig Start Date End Date Taking? Authorizing Provider  Acetaminophen (TYLENOL ARTHRITIS PAIN PO) Take 1 capsule by mouth every 6 (six) hours as needed (for pain).    Yes Historical Provider, MD  Calcium Carbonate-Vitamin D (CALCIUM 600/VITAMIN D) 600-400 MG-UNIT per tablet Take 1 tablet by mouth daily.   Yes Historical Provider, MD  celecoxib (CELEBREX) 200 MG capsule Take 200 mg by mouth daily.    Yes Historical Provider, MD  famotidine (PEPCID) 40 MG tablet Take 40 mg by mouth daily.   Yes Historical Provider, MD  folic acid (FOLVITE) 423 MCG tablet Take 400 mcg by mouth daily.   Yes Historical Provider, MD  Glucosamine-Chondroit-Vit C-Mn (GLUCOSAMINE CHONDR 1500 COMPLX) CAPS Take 2 capsules by mouth daily.    Yes Historical Provider, MD  hydroxychloroquine (PLAQUENIL) 200 MG tablet Take 400 mg by mouth daily.    Yes Historical Provider, MD  Hylan (SYNVISC IX) Inject into the articular space.   Yes Historical Provider, MD  Probiotic Product (PROBIOTIC PO) Take 1 capsule by mouth daily.   Yes Historical Provider, MD    Family History  Problem Relation Age of Onset  . Cancer Maternal Aunt   . Cancer Paternal Aunt   . Cancer  Brother     History   Social History  . Marital Status: Widowed    Spouse Name: N/A    Number of Children: N/A  . Years of Education: N/A   Social History Main Topics  . Smoking status: Former Smoker    Quit date: 07/11/1986  . Smokeless tobacco: Never Used  . Alcohol Use: Yes     Comment: cocktail maybe once a month  . Drug Use: No  . Sexual Activity: None   Other Topics Concern  . None   Social History Narrative        Review of Systems  Constitutional: Negative for fever and chills.  Respiratory: Negative for cough and shortness of breath.   Cardiovascular: Negative for chest pain.  Gastrointestinal: Negative for nausea, vomiting, abdominal pain and blood in stool.  Genitourinary: Negative for dysuria and hematuria.  Musculoskeletal: Positive for arthralgias.       Rt knee pain  Neurological: Negative for headaches.  Hematological: Bruises/bleeds easily.    Vital Signs: BP 129/90  HR 63  R 16 TEMP 97.7  O2 SATS 100% RA Physical Exam  Constitutional: She is oriented to person, place, and time. She appears well-developed and well-nourished.  Cardiovascular: Normal rate and regular rhythm.   Pulmonary/Chest: Effort normal and breath sounds normal.  Abdominal: Soft. Bowel sounds are normal. There is no tenderness.  obese  Musculoskeletal: Normal range of motion.  Neurological: She is alert and oriented to person, place, and  time.    Imaging: US Abdomen Limited  09/25/2014   CLINICAL DATA:  Pancytopenia, rheumatoid arthritis, former smoker  EXAM: LIMITED ABDOMINAL ULTRASOUND  COMPARISON:  None  FINDINGS: Sonography does spleen was performed.  Spleen measures 10.5 cm length with a calculated volume of 353 mL.  No focal splenic mass lesions identified.  No LEFT upper quadrant/perisplenic free fluid.  IMPRESSION: Unremarkable spleen.   Electronically Signed   By: Lavonia Dana M.D.   On: 09/25/2014 09:42    Labs:  CBC:  Recent Labs  09/22/14 1604  09/29/14 0708  WBC 3.7* 3.4*  HGB 12.1 12.5  HCT 36.3 37.3  PLT 120* 117*    COAGS:  Recent Labs  09/29/14 0708  INR 1.02  APTT 32    BMP:  Recent Labs  09/22/14 1604  NA 142  K 3.7  CL 104  CO2 28  GLUCOSE 88  BUN 12  CALCIUM 10.3  CREATININE 0.86  GFRNONAA 68*  GFRAA 79*    LIVER FUNCTION TESTS:  Recent Labs  09/22/14 1604  BILITOT 0.6  AST 27  ALT 12  ALKPHOS 64  PROT 7.3  ALBUMIN 4.0    TUMOR MARKERS: No results for input(s): AFPTM, CEA, CA199, CHROMGRNA in the last 8760 hours.  Assessment and Plan: Melissa Villanueva is a 68 y.o. female with history of pancytopenia while on methotrexate and hydroxychloroquine sulfate for rheumatoid arthritis. She presents today for CT guided bone marrow biopsy for further evaluation. Details/risks of procedure d/w pt/daughter with their understanding and consent.         Signed: Autumn Messing 09/29/2014, 8:24 AM

## 2014-09-29 NOTE — Discharge Instructions (Signed)
Bone Marrow Aspiration, Bone Marrow Biopsy  Care After  Read the instructions outlined below and refer to this sheet in the next few weeks. These discharge instructions provide you with general information on caring for yourself after you leave the hospital. Your caregiver may also give you specific instructions. While your treatment has been planned according to the most current medical practices available, unavoidable complications occasionally occur. If you have any problems or questions after discharge, call your caregiver.  FINDING OUT THE RESULTS OF YOUR TEST  Not all test results are available during your visit. If your test results are not back during the visit, make an appointment with your caregiver to find out the results. Do not assume everything is normal if you have not heard from your caregiver or the medical facility. It is important for you to follow up on all of your test results.   HOME CARE INSTRUCTIONS   You have had sedation and may be sleepy or dizzy. Your thinking may not be as clear as usual. For the next 24 hours:  · Only take over-the-counter or prescription medicines for pain, discomfort, and or fever as directed by your caregiver.  · Do not drink alcohol.  · Do not smoke.  · Do not drive.  · Do not make important legal decisions.  · Do not operate heavy machinery.  · Do not care for small children by yourself.  · Keep your dressing clean and dry. You may replace dressing with a bandage after 24 hours.  · You may take a bath or shower after 24 hours.  · Use an ice pack for 20 minutes every 2 hours while awake for pain as needed.  SEEK MEDICAL CARE IF:   · There is redness, swelling, or increasing pain at the biopsy site.  · There is pus coming from the biopsy site.  · There is drainage from a biopsy site lasting longer than one day.  · An unexplained oral temperature above 102° F (38.9° C) develops.  SEEK IMMEDIATE MEDICAL CARE IF:   · You develop a rash.  · You have difficulty  breathing.  · You develop any reaction or side effects to medications given.  Document Released: 06/03/2005 Document Revised: 02/06/2012 Document Reviewed: 11/11/2008  ExitCare® Patient Information ©2015 [employer]. This information is not intended to replace advice given to you by your health care provider. Make sure you discuss any questions you have with your health care provider.

## 2014-09-29 NOTE — Procedures (Addendum)
BM Bx No comp

## 2014-10-03 ENCOUNTER — Ambulatory Visit (HOSPITAL_COMMUNITY): Payer: Medicare Other

## 2014-10-03 LAB — CHROMOSOME ANALYSIS, BONE MARROW

## 2014-10-06 ENCOUNTER — Other Ambulatory Visit (HOSPITAL_COMMUNITY): Payer: Self-pay

## 2014-10-06 DIAGNOSIS — D61818 Other pancytopenia: Secondary | ICD-10-CM

## 2014-10-08 ENCOUNTER — Encounter (HOSPITAL_COMMUNITY): Payer: Self-pay

## 2014-10-10 DIAGNOSIS — M1711 Unilateral primary osteoarthritis, right knee: Secondary | ICD-10-CM | POA: Diagnosis not present

## 2014-10-14 ENCOUNTER — Encounter: Payer: Self-pay | Admitting: Hematology and Oncology

## 2014-10-15 ENCOUNTER — Other Ambulatory Visit (HOSPITAL_COMMUNITY): Payer: Medicare Other

## 2014-10-15 ENCOUNTER — Ambulatory Visit (HOSPITAL_COMMUNITY): Payer: Medicare Other

## 2014-10-16 ENCOUNTER — Encounter (HOSPITAL_COMMUNITY): Payer: Self-pay

## 2014-10-16 ENCOUNTER — Encounter (HOSPITAL_COMMUNITY): Payer: Medicare Other

## 2014-10-16 ENCOUNTER — Encounter (HOSPITAL_COMMUNITY): Payer: Medicare Other | Attending: Hematology and Oncology

## 2014-10-16 VITALS — BP 141/55 | HR 72 | Temp 97.8°F | Resp 18 | Wt 200.0 lb

## 2014-10-16 DIAGNOSIS — M810 Age-related osteoporosis without current pathological fracture: Secondary | ICD-10-CM

## 2014-10-16 DIAGNOSIS — M069 Rheumatoid arthritis, unspecified: Secondary | ICD-10-CM | POA: Diagnosis not present

## 2014-10-16 DIAGNOSIS — D61818 Other pancytopenia: Secondary | ICD-10-CM | POA: Diagnosis not present

## 2014-10-16 LAB — CBC WITH DIFFERENTIAL/PLATELET
BASOS ABS: 0 10*3/uL (ref 0.0–0.1)
BASOS PCT: 1 % (ref 0–1)
Eosinophils Absolute: 0 10*3/uL (ref 0.0–0.7)
Eosinophils Relative: 1 % (ref 0–5)
HCT: 36.1 % (ref 36.0–46.0)
Hemoglobin: 12 g/dL (ref 12.0–15.0)
Lymphocytes Relative: 24 % (ref 12–46)
Lymphs Abs: 1 10*3/uL (ref 0.7–4.0)
MCH: 32.3 pg (ref 26.0–34.0)
MCHC: 33.2 g/dL (ref 30.0–36.0)
MCV: 97.3 fL (ref 78.0–100.0)
MONO ABS: 0.2 10*3/uL (ref 0.1–1.0)
Monocytes Relative: 5 % (ref 3–12)
NEUTROS ABS: 2.7 10*3/uL (ref 1.7–7.7)
NEUTROS PCT: 69 % (ref 43–77)
Platelets: 119 10*3/uL — ABNORMAL LOW (ref 150–400)
RBC: 3.71 MIL/uL — ABNORMAL LOW (ref 3.87–5.11)
RDW: 13.8 % (ref 11.5–15.5)
WBC: 3.9 10*3/uL — ABNORMAL LOW (ref 4.0–10.5)

## 2014-10-16 NOTE — Progress Notes (Signed)
[place], [place] [address]  DIAGNOSIS: Other pancytopenia - Plan: CBC  Rheumatoid arthritis  Osteoporosis  Chief Complaint  Patient presents with  . Pancytopenia    CURRENT THERAPY: Status post bone marrow biopsy for pancytopenia..  INTERVAL HISTORY: Melissa Villanueva 68 y.o. female returns for follow-up after undergoing bone marrow examination to explain pancytopenia having been on methotrexate and hydroxychloroquine sulfate for rheumatoid arthritis since 2003.  Since stopping methotrexate she has had more stiffness in the morning involving both hands. She denies a fever, night sweats, easy satiety, cough, wheezing, sore throat, easy bruising, lower extremity swelling or redness, PND, orthopnea, palpitations, headache, or seizures.   MEDICAL HISTORY: Past Medical History  Diagnosis Date  . Arthritis   . GERD (gastroesophageal reflux disease)     INTERIM HISTORY:  does not have a problem list on file.    ALLERGIES:  has No Known Allergies.  MEDICATIONS: has a current medication list which includes the following prescription(s): calcium carbonate-vitamin d, celecoxib, famotidine, folic acid, glucosamine chondr 1500 complx, hydroxychloroquine, probiotic product, solifenacin, and acetaminophen.  SURGICAL HISTORY:  Past Surgical History  Procedure Laterality Date  . Total hip arthroplasty Left   . Cholecystectomy    . Cesarean section    . Eye surgery  Sept 2015    Bilateral Glaucoma with surgery    FAMILY HISTORY: family history includes Cancer in her brother, maternal aunt, and paternal aunt.  SOCIAL HISTORY:  reports that she quit smoking about 28 years ago. She has never used smokeless tobacco. She reports that she drinks alcohol. She reports that she does not use illicit drugs.  REVIEW OF SYSTEMS:  Other than that discussed above is  noncontributory.  PHYSICAL EXAMINATION: ECOG PERFORMANCE STATUS: 1 - Symptomatic but completely ambulatory  Blood pressure 141/55, pulse 72, temperature 97.8 F (36.6 C), temperature source Oral, resp. rate 18, weight 200 lb (90.719 kg), SpO2 98 %.  GENERAL:alert, no distress and comfortable. Moderately obese. SKIN: skin color, texture, turgor are normal, no rashes or significant lesions EYES: PERLA; Conjunctiva are pink and non-injected, sclera clear SINUSES: No redness or tenderness over maxillary or ethmoid sinuses OROPHARYNX:no exudate, no erythema on lips, buccal mucosa, or tongue. NECK: supple, thyroid normal size, non-tender, without nodularity. No masses CHEST: Normal AP diameter with no breast masses. LYMPH:  no palpable lymphadenopathy in the cervical, axillary or inguinal LUNGS: clear to auscultation and percussion with normal breathing effort HEART: regular rate & rhythm and no murmurs. ABDOMEN:abdomen soft, non-tender and normal bowel sounds. Soft with no organomegaly, ascites, or CVA tenderness. MUSCULOSKELETAL:no cyanosis of digits and no clubbing. Range of motion normal.  NEURO: alert & oriented x 3 with fluent speech, no focal motor/sensory deficits   LABORATORY DATA: Appointment on 10/16/2014  Component Date Value Ref Range Status  . WBC 10/16/2014 3.9* 4.0 - 10.5 K/uL Final  . RBC 10/16/2014 3.71* 3.87 - 5.11 MIL/uL Final  . Hemoglobin 10/16/2014 12.0  12.0 - 15.0 g/dL Final  . HCT 10/16/2014 36.1  36.0 - 46.0 % Final  . MCV 10/16/2014 97.3  78.0 - 100.0 fL Final  . MCH 10/16/2014 32.3  26.0 - 34.0 pg Final  . MCHC 10/16/2014 33.2  30.0 - 36.0 g/dL Final  . RDW 10/16/2014 13.8  11.5 - 15.5 % Final  . Platelets 10/16/2014 119* 150 - 400 K/uL Final   Comment: SPECIMEN CHECKED FOR  CLOTS CONSISTENT WITH PREVIOUS RESULT   . Neutrophils Relative % 10/16/2014 69  43 - 77 % Final  . Neutro Abs 10/16/2014 2.7  1.7 - 7.7 K/uL Final  . Lymphocytes Relative 10/16/2014  24  12 - 46 % Final  . Lymphs Abs 10/16/2014 1.0  0.7 - 4.0 K/uL Final  . Monocytes Relative 10/16/2014 5  3 - 12 % Final  . Monocytes Absolute 10/16/2014 0.2  0.1 - 1.0 K/uL Final  . Eosinophils Relative 10/16/2014 1  0 - 5 % Final  . Eosinophils Absolute 10/16/2014 0.0  0.0 - 0.7 K/uL Final  . Basophils Relative 10/16/2014 1  0 - 1 % Final  . Basophils Absolute 10/16/2014 0.0  0.0 - 0.1 K/uL Final  Hospital Outpatient Visit on 09/29/2014  Component Date Value Ref Range Status  . aPTT 09/29/2014 32  24 - 37 seconds Final  . WBC 09/29/2014 3.4* 4.0 - 10.5 K/uL Final  . RBC 09/29/2014 3.87  3.87 - 5.11 MIL/uL Final  . Hemoglobin 09/29/2014 12.5  12.0 - 15.0 g/dL Final  . HCT 09/29/2014 37.3  36.0 - 46.0 % Final  . MCV 09/29/2014 96.4  78.0 - 100.0 fL Final  . MCH 09/29/2014 32.3  26.0 - 34.0 pg Final  . MCHC 09/29/2014 33.5  30.0 - 36.0 g/dL Final  . RDW 09/29/2014 13.6  11.5 - 15.5 % Final  . Platelets 09/29/2014 117* 150 - 400 K/uL Final   Comment: SPECIMEN CHECKED FOR CLOTS PLATELET COUNT CONFIRMED BY SMEAR   . Prothrombin Time 09/29/2014 13.5  11.6 - 15.2 seconds Final  . INR 09/29/2014 1.02  0.00 - 1.49 Final  . Chromosome-Routine 09/29/2014 SEE SEPARATE REPORT   Final   Performed at [facility]  Office Visit on 09/22/2014  Component Date Value Ref Range Status  . WBC 09/22/2014 3.7* 4.0 - 10.5 K/uL Final  . RBC 09/22/2014 3.75* 3.87 - 5.11 MIL/uL Final  . Hemoglobin 09/22/2014 12.1  12.0 - 15.0 g/dL Final  . HCT 09/22/2014 36.3  36.0 - 46.0 % Final  . MCV 09/22/2014 96.8  78.0 - 100.0 fL Final  . MCH 09/22/2014 32.3  26.0 - 34.0 pg Final  . MCHC 09/22/2014 33.3  30.0 - 36.0 g/dL Final  . RDW 09/22/2014 13.9  11.5 - 15.5 % Final  . Platelets 09/22/2014 120* 150 - 400 K/uL Final  . Neutrophils Relative % 09/22/2014 71  43 - 77 % Final  . Neutro Abs 09/22/2014 2.6  1.7 - 7.7 K/uL Final  . Lymphocytes Relative 09/22/2014 20  12 - 46 % Final  . Lymphs Abs 09/22/2014  0.8  0.7 - 4.0 K/uL Final  . Monocytes Relative 09/22/2014 7  3 - 12 % Final  . Monocytes Absolute 09/22/2014 0.2  0.1 - 1.0 K/uL Final  . Eosinophils Relative 09/22/2014 1  0 - 5 % Final  . Eosinophils Absolute 09/22/2014 0.0  0.0 - 0.7 K/uL Final  . Basophils Relative 09/22/2014 1  0 - 1 % Final  . Basophils Absolute 09/22/2014 0.0  0.0 - 0.1 K/uL Final  . Retic Ct Pct 09/22/2014 1.0  0.4 - 3.1 % Final  . RBC. 09/22/2014 3.75* 3.87 - 5.11 MIL/uL Final  . Retic Count, Manual 09/22/2014 37.5  19.0 - 186.0 K/uL Final  . Sodium 09/22/2014 142  137 - 147 mEq/L Final  . Potassium 09/22/2014 3.7  3.7 - 5.3 mEq/L Final  . Chloride 09/22/2014 104  96 - 112 mEq/L Final  .  CO2 09/22/2014 28  19 - 32 mEq/L Final  . Glucose, Bld 09/22/2014 88  70 - 99 mg/dL Final  . BUN 09/22/2014 12  6 - 23 mg/dL Final  . Creatinine, Ser 09/22/2014 0.86  0.50 - 1.10 mg/dL Final  . Calcium 09/22/2014 10.3  8.4 - 10.5 mg/dL Final  . Total Protein 09/22/2014 7.3  6.0 - 8.3 g/dL Final  . Albumin 09/22/2014 4.0  3.5 - 5.2 g/dL Final  . AST 09/22/2014 27  0 - 37 U/L Final  . ALT 09/22/2014 12  0 - 35 U/L Final  . Alkaline Phosphatase 09/22/2014 64  39 - 117 U/L Final  . Total Bilirubin 09/22/2014 0.6  0.3 - 1.2 mg/dL Final  . GFR calc non Af Amer 09/22/2014 68* >90 mL/min Final  . GFR calc Af Amer 09/22/2014 79* >90 mL/min Final   Comment: (NOTE)                          The eGFR has been calculated using the CKD EPI equation.                          This calculation has not been validated in all clinical situations.                          eGFR's persistently <90 mL/min signify possible Chronic Kidney                          Disease.  . Anion gap 09/22/2014 10  5 - 15 Final  . LDH 09/22/2014 317* 94 - 250 U/L Final   SLIGHT HEMOLYSIS  . Vitamin B-12 09/22/2014 644  211 - 911 pg/mL Final   Performed at [facility]  . Folate 09/22/2014 >20.0   Final   Comment: (NOTE)                           Reference Ranges                                 Deficient:       0.4 - 3.3 ng/mL                                 Indeterminate:   3.4 - 5.4 ng/mL                                 Normal:              > 5.4 ng/mL                          Performed at [facility]  . ANA Ser Ql 09/22/2014 NEGATIVE  NEGATIVE Final   Performed at [facility]:  FINAL for AMAIA, LAVALLIE ([id number]) Patient: VERNITA, TAGUE Collected: 09/29/2014 Client: [facility] Accession: [id number] Received: 09/29/2014 Art Hoss DOB: 11-Jan-1946 Age: 86 Gender: F Reported: 09/30/2014 [address] Patient Ph: [phone] MRN #: [id number] [place], [place] [address] Visit #: [id number] Chart #: Phone: [phone] Fax: CC: Farrel Gobble, MD  BONE MARROW REPORT FINAL DIAGNOSIS Diagnosis Bone Marrow, Aspirate,Biopsy, and Clot, right iliac - HYPERCELLULAR BONE MARROW FOR AGE WITH TRILINEAGE HEMATOPOIESIS. - SEE COMMENT. PERIPHERAL BLOOD: - LEUKOPENIA AND THROMBOCYTOPENIA. Diagnosis Note The overall appearance is considered nonspecific especially in the setting of known history of rheumatoid arthritis and methotrexate treatment. However, the differential diagnosis includes an evolving low grade clonal myelodysplastic state and hence correlation with cytogenetic studies is recommended. (BNS:ecj Oct 29, 2014) Susanne Greenhouse MD Pathologist, Electronic Signature (Case signed Oct 29, 2014) GROSS AND MICROSCOPIC INFORMATION Specimen Clinical Information Rheumatoid arthritis on methotrexate(je) Source Bone Marrow, Aspirate,Biopsy, and Clot, right iliac Microscopic LAB DATA: CBC performed on 09/29/2014 shows: WBC 3.4 K/ul Neutrophils 75% HB 12.5 g/dl Lymphocytes 18% HCT 37.3 % Monocytes 2% MCV 96.4 fL Eosinophils 3% RDW 13.6 % Basophils 2% PLT 117 K/ul 1 of 3 FINAL for Guard, Emmersyn ([id number]) Microscopic(continued) PERIPHERAL BLOOD SMEAR: The red blood cells display mild  anisopoikilocytosis with minimal polychromasia. The white blood cells are slightly decreased in number. Scattered neutrophils display hypogranulation. Significant neutrophilic left shift, however, is not seen on scan. The platelets are decreased in number. BONE MARROW ASPIRATE: Limited material with a few small particles present for evaluation. The following evaluation was performed on limited aspirate and touch imprint material. Erythroid precursors: Progressive maturation with scattered cells displaying nuclear cytoplasmic dyssynchrony. Granulocytic precursors: Progressive maturation with abundant neutrophils. Some of the neutrophils display hypogranular cytoplasm. No increase in blastic cells is identified. Megakaryocytes: Abundant with occasional small hypolobated forms. Lymphocytes/plasma cells: Large aggregates not present. TOUCH PREPARATIONS: A mixture of cell types seen (see above). CLOT and BIOPSY: The core biopsies show prominent aspiration artifact. In relatively intact areas and the clot sections, the cellularity is estimated at 40 to 70%. There is a mixture of myeloid cell types. Expansile sheets of blastic cells are not identified. IRON STAIN: Iron stains are performed on a bone marrow aspirate smear and section of clot. The controls stained appropriately. Storage Iron: Present. Ringed Sideroblasts: Occasional cells present. ADDITIONAL DATA / TESTING: The specimen was sent for cytogenetic analysis and a separate report will follow. Specimen Table Bone Marrow count performed on 500 cells shows: Blasts: 2% Myeloid 59% Promyelocyts: 0% Myelocytes: 8% Erythroid 29% Metamyelocyts: 1% Bands: 12% Lymphocytes: 10% Neutrophils: 33% Eosinophils: 3% Plasma Cells: 1% Basophils: 0% Monocytes: 1% M:E ratio: 2.03 Gross The specimen is received in [place] and consists of a 1.0 x 1.0 x 1.0 mm aggregate of red-brown clotted blood. The specimen is entirely submitted in cassette 1A. A  second specimen is received in [facility]'s and consists of three cores and one fragment of tan-brown, porous bone ranging from 0.3 x 0.2 x 0.1 cm to 1.0 cm in length x 0.2 cm in diameter. The specimen is entirely submitted in cassette 1B following decalcification. (KL:ecj 09/29/2014) 2 of 3 FINAL for Spagnolo, Britani ([id number]) Stain(s) Used in Diagnosis The following stain(s) were used in diagnosing the case: Iron Stain. The control(s) stained appropriately. Report signed out from following location(s) Associate Professor and Interpretation performed at [email or web address]    Urinalysis No results found for: COLORURINE, APPEARANCEUR, LABSPEC, [place], GLUCOSEU, HGBUR, BILIRUBINUR, KETONESUR, PROTEINUR, UROBILINOGEN, NITRITE, LEUKOCYTESUR  RADIOGRAPHIC STUDIES: US Abdomen Limited  09/25/2014   CLINICAL DATA:  Pancytopenia, rheumatoid arthritis, former smoker  EXAM: LIMITED ABDOMINAL ULTRASOUND  COMPARISON:  None  FINDINGS: Sonography does spleen was performed.  Spleen measures 10.5 cm length with a calculated volume of 353 mL.  No focal splenic mass lesions identified.  No LEFT upper  quadrant/perisplenic free fluid.  IMPRESSION: Unremarkable spleen.   Electronically Signed   By: Lavonia Dana M.D.   On: 09/25/2014 09:42   Ct Biopsy  09/29/2014   CLINICAL DATA:  Leukopenia  EXAM: CT-GUIDED BONE MARROW ASPIRATE AND CORE.  MEDICATIONS AND MEDICAL HISTORY: Versed 2 mg, Fentanyl 100 mcg.  Additional Medications: None.  ANESTHESIA/SEDATION: Moderate sedation time: 11 minutes  PROCEDURE: The procedure, risks, benefits, and alternatives were explained to the patient. Questions regarding the procedure were encouraged and answered. The patient understands and consents to the procedure.  The back was prepped with Betadine in a sterile fashion, and a sterile drape was applied covering the operative field. A sterile gown and sterile gloves were used for the procedure.  Under CT guidance, an  11 gauge needle was inserted into the right iliac bone via posterior approach. Aspirates and a core were obtained. Final imaging was performed.  Patient tolerated the procedure well without complication. Vital sign monitoring by nursing staff during the procedure will continue as patient is in the special procedures unit for post procedure observation.  FINDINGS: The images document guide needle placement within the right iliac bone. Post biopsy images demonstrate no hemorrhage.  IMPRESSION: Successful CT-guided right iliac bone marrow aspirate and core.   Electronically Signed   By: Maryclare Bean M.D.   On: 09/29/2014 11:43    ASSESSMENT:  #1. Methotrexate-induced pancytopenia without overt cytogenetic abnormalities to qualify as myelodysplastic syndrome.  #2. Rheumatoid arthritis, on Plaquenil. #3. Osteoporosis, on treatment. #4. Status post Hylan right knee joint injection, improved.   PLAN:  #1. Avoid methotrexate in the future.  #2. Aleve 4 tablets at bedtime. #3. Follow-up in 3 months with CBC. She was told to call should she develop fever, easy bruising, or fatigue.   All questions were answered. The patient knows to call the clinic with any problems, questions or concerns. We can certainly see the patient much sooner if necessary.   I spent 25 minutes counseling the patient face to face. The total time spent in the appointment was 30 minutes.    Doroteo Bradford, MD 10/16/2014 4:12 PM  DISCLAIMER:  This note was dictated with voice recognition software.  Similar sounding words can inadvertently be transcribed inaccurately and may not be corrected upon review.

## 2014-10-16 NOTE — Progress Notes (Signed)
Melissa Villanueva presented for labwork. Labs per MD order drawn via Peripheral Line 23 gauge needle inserted in right antecubital.  Good blood return present. Procedure without incident.  Needle removed intact. Patient tolerated procedure well.

## 2014-10-16 NOTE — Patient Instructions (Signed)
[facility] Discharge Instructions  RECOMMENDATIONS MADE BY THE CONSULTANT AND ANY TEST RESULTS WILL BE SENT TO YOUR REFERRING PHYSICIAN.  EXAM FINDINGS BY THE PHYSICIAN TODAY AND SIGNS OR SYMPTOMS TO REPORT TO CLINIC OR PRIMARY PHYSICIAN: Exam and findings as discussed by Dr.Formanek.  MEDICATIONS PRESCRIBED:  DISCONTINUE Methotrexate. DO NOT EVER take this drug again. Per Dr.Formanek you are to take Aleve 4 tablets each night at bedtime.  INSTRUCTIONS/FOLLOW-UP: Return to clinic in 3 months for lab work and MD appointment. Report any bleeding issues or episodes of fever to the clinic.  Thank you for choosing [facility] to provide your oncology and hematology care.  To afford each patient quality time with our providers, please arrive at least 15 minutes before your scheduled appointment time.  With your help, our goal is to use those 15 minutes to complete the necessary work-up to ensure our physicians have the information they need to help with your evaluation and healthcare recommendations.    Effective January 1st, 2014, we ask that you re-schedule your appointment with our physicians should you arrive 10 or more minutes late for your appointment.  We strive to give you quality time with our providers, and arriving late affects you and other patients whose appointments are after yours.    Again, thank you for choosing [facility].  Our hope is that these requests will decrease the amount of time that you wait before being seen by our physicians.       _____________________________________________________________  Should you have questions after your visit to [facility], please contact our office at [phone] between the hours of 8:30 a.m. and 4:30 p.m.  Voicemails left after 4:30 p.m. will not be returned until the following business day.  For prescription refill requests, have your pharmacy contact our office with your  prescription refill request.    _______________________________________________________________  We hope that we have given you very good care.  You may receive a patient satisfaction survey in the mail, please complete it and return it as soon as possible.  We value your feedback!  _______________________________________________________________  Have you asked about our STAR program?  STAR stands for Survivorship Training and Rehabilitation, and this is a nationally recognized cancer care program that focuses on survivorship and rehabilitation.  Cancer and cancer treatments may cause problems, such as, pain, making you feel tired and keeping you from doing the things that you need or want to do. Cancer rehabilitation can help. Our goal is to reduce these troubling effects and help you have the best quality of life possible.  You may receive a survey from a nurse that asks questions about your current state of health.  Based on the survey results, all eligible patients will be referred to the [facility] program for an evaluation so we can better serve you!  A frequently asked questions sheet is available upon request.

## 2014-10-17 ENCOUNTER — Other Ambulatory Visit (HOSPITAL_COMMUNITY): Payer: Self-pay | Admitting: Hematology and Oncology

## 2014-10-17 DIAGNOSIS — M069 Rheumatoid arthritis, unspecified: Secondary | ICD-10-CM

## 2014-10-17 DIAGNOSIS — D61818 Other pancytopenia: Secondary | ICD-10-CM | POA: Insufficient documentation

## 2014-10-30 LAB — BONE MARROW EXAM

## 2014-11-12 DIAGNOSIS — E559 Vitamin D deficiency, unspecified: Secondary | ICD-10-CM | POA: Diagnosis not present

## 2014-11-12 DIAGNOSIS — I509 Heart failure, unspecified: Secondary | ICD-10-CM | POA: Diagnosis not present

## 2014-11-25 DIAGNOSIS — D709 Neutropenia, unspecified: Secondary | ICD-10-CM | POA: Diagnosis not present

## 2014-11-25 DIAGNOSIS — I5032 Chronic diastolic (congestive) heart failure: Secondary | ICD-10-CM | POA: Diagnosis not present

## 2014-11-25 DIAGNOSIS — K219 Gastro-esophageal reflux disease without esophagitis: Secondary | ICD-10-CM | POA: Diagnosis not present

## 2014-11-25 DIAGNOSIS — E785 Hyperlipidemia, unspecified: Secondary | ICD-10-CM | POA: Diagnosis not present

## 2014-11-28 DIAGNOSIS — D61818 Other pancytopenia: Secondary | ICD-10-CM

## 2014-11-28 HISTORY — DX: Other pancytopenia: D61.818

## 2014-12-08 DIAGNOSIS — M8580 Other specified disorders of bone density and structure, unspecified site: Secondary | ICD-10-CM | POA: Diagnosis not present

## 2014-12-08 DIAGNOSIS — M1711 Unilateral primary osteoarthritis, right knee: Secondary | ICD-10-CM | POA: Diagnosis not present

## 2014-12-08 DIAGNOSIS — M069 Rheumatoid arthritis, unspecified: Secondary | ICD-10-CM | POA: Diagnosis not present

## 2014-12-08 DIAGNOSIS — M65342 Trigger finger, left ring finger: Secondary | ICD-10-CM | POA: Diagnosis not present

## 2014-12-30 DIAGNOSIS — Z961 Presence of intraocular lens: Secondary | ICD-10-CM | POA: Diagnosis not present

## 2014-12-30 DIAGNOSIS — M069 Rheumatoid arthritis, unspecified: Secondary | ICD-10-CM | POA: Diagnosis not present

## 2014-12-30 DIAGNOSIS — Z79899 Other long term (current) drug therapy: Secondary | ICD-10-CM | POA: Diagnosis not present

## 2015-01-02 ENCOUNTER — Emergency Department: Payer: Self-pay | Admitting: Emergency Medicine

## 2015-01-02 DIAGNOSIS — M19032 Primary osteoarthritis, left wrist: Secondary | ICD-10-CM | POA: Diagnosis not present

## 2015-01-02 DIAGNOSIS — S6992XA Unspecified injury of left wrist, hand and finger(s), initial encounter: Secondary | ICD-10-CM | POA: Diagnosis not present

## 2015-01-02 DIAGNOSIS — M25532 Pain in left wrist: Secondary | ICD-10-CM | POA: Diagnosis not present

## 2015-01-02 DIAGNOSIS — S0003XA Contusion of scalp, initial encounter: Secondary | ICD-10-CM | POA: Diagnosis not present

## 2015-01-02 DIAGNOSIS — S0083XA Contusion of other part of head, initial encounter: Secondary | ICD-10-CM | POA: Diagnosis not present

## 2015-01-02 DIAGNOSIS — S63502A Unspecified sprain of left wrist, initial encounter: Secondary | ICD-10-CM | POA: Diagnosis not present

## 2015-01-02 DIAGNOSIS — S0990XA Unspecified injury of head, initial encounter: Secondary | ICD-10-CM | POA: Diagnosis not present

## 2015-01-06 DIAGNOSIS — S0590XA Unspecified injury of unspecified eye and orbit, initial encounter: Secondary | ICD-10-CM | POA: Diagnosis not present

## 2015-01-14 ENCOUNTER — Other Ambulatory Visit (HOSPITAL_COMMUNITY): Payer: Self-pay

## 2015-01-14 DIAGNOSIS — D61818 Other pancytopenia: Secondary | ICD-10-CM

## 2015-01-16 ENCOUNTER — Ambulatory Visit (HOSPITAL_COMMUNITY): Payer: Medicare Other | Admitting: Hematology & Oncology

## 2015-01-16 ENCOUNTER — Other Ambulatory Visit (HOSPITAL_COMMUNITY): Payer: Medicare Other

## 2015-01-19 DIAGNOSIS — M1711 Unilateral primary osteoarthritis, right knee: Secondary | ICD-10-CM | POA: Diagnosis not present

## 2015-01-19 DIAGNOSIS — S8001XD Contusion of right knee, subsequent encounter: Secondary | ICD-10-CM | POA: Diagnosis not present

## 2015-02-23 ENCOUNTER — Encounter (HOSPITAL_COMMUNITY): Payer: Medicare Other | Attending: Hematology & Oncology

## 2015-02-23 ENCOUNTER — Encounter (HOSPITAL_BASED_OUTPATIENT_CLINIC_OR_DEPARTMENT_OTHER): Payer: Medicare Other | Admitting: Hematology & Oncology

## 2015-02-23 ENCOUNTER — Ambulatory Visit (HOSPITAL_COMMUNITY): Payer: Medicare Other | Admitting: Hematology & Oncology

## 2015-02-23 ENCOUNTER — Encounter (HOSPITAL_COMMUNITY): Payer: Self-pay | Admitting: Hematology & Oncology

## 2015-02-23 DIAGNOSIS — D61818 Other pancytopenia: Secondary | ICD-10-CM | POA: Insufficient documentation

## 2015-02-23 DIAGNOSIS — M069 Rheumatoid arthritis, unspecified: Secondary | ICD-10-CM | POA: Diagnosis not present

## 2015-02-23 DIAGNOSIS — Z139 Encounter for screening, unspecified: Secondary | ICD-10-CM

## 2015-02-23 LAB — CBC WITH DIFFERENTIAL/PLATELET
BASOS ABS: 0 10*3/uL (ref 0.0–0.1)
BASOS PCT: 1 % (ref 0–1)
EOS ABS: 0 10*3/uL (ref 0.0–0.7)
EOS PCT: 1 % (ref 0–5)
HEMATOCRIT: 38.5 % (ref 36.0–46.0)
HEMOGLOBIN: 12.6 g/dL (ref 12.0–15.0)
Lymphocytes Relative: 23 % (ref 12–46)
Lymphs Abs: 1 10*3/uL (ref 0.7–4.0)
MCH: 31.7 pg (ref 26.0–34.0)
MCHC: 32.7 g/dL (ref 30.0–36.0)
MCV: 97 fL (ref 78.0–100.0)
MONO ABS: 0.4 10*3/uL (ref 0.1–1.0)
MONOS PCT: 9 % (ref 3–12)
Neutro Abs: 2.9 10*3/uL (ref 1.7–7.7)
Neutrophils Relative %: 66 % (ref 43–77)
Platelets: 126 10*3/uL — ABNORMAL LOW (ref 150–400)
RBC: 3.97 MIL/uL (ref 3.87–5.11)
RDW: 14.5 % (ref 11.5–15.5)
WBC: 4.4 10*3/uL (ref 4.0–10.5)

## 2015-02-23 NOTE — Patient Instructions (Signed)
..  [place] at [facility] Discharge Instructions  RECOMMENDATIONS MADE BY THE CONSULTANT AND ANY TEST RESULTS WILL BE SENT TO YOUR REFERRING PHYSICIAN.  Exam per Dr. Whitney Muse  Mammogram ordered, see Amy for appt prior to discharge  Return in 6 months  Thank you for choosing [place] at [facility] to provide your oncology and hematology care.  To afford each patient quality time with our provider, please arrive at least 15 minutes before your scheduled appointment time.    You need to re-schedule your appointment should you arrive 10 or more minutes late.  We strive to give you quality time with our providers, and arriving late affects you and other patients whose appointments are after yours.  Also, if you no show three or more times for appointments you may be dismissed from the clinic at the providers discretion.     Again, thank you for choosing [facility].  Our hope is that these requests will decrease the amount of time that you wait before being seen by our physicians.       _____________________________________________________________  Should you have questions after your visit to [facility], please contact our office at [phone] between the hours of 8:30 a.m. and 4:30 p.m.  Voicemails left after 4:30 p.m. will not be returned until the following business day.  For prescription refill requests, have your pharmacy contact our office.

## 2015-02-23 NOTE — Progress Notes (Signed)
Labs drawn

## 2015-02-23 NOTE — Progress Notes (Signed)
Melissa Skates, MD [place] [address]    DIAGNOSIS:  Pancytopenia   Rheumatoid Arthritis on MTX and plaquenil since 2003   BMBX on 09/30/2014 with hypercellular marrow for age with trilineage hematopoiesis. Normal cytogenetics  CURRENT THERAPY: Observation  INTERVAL HISTORY: Melissa Villanueva 69 y.o. female returns for follow-up of a history of pancytopenia. She had evidence of leukopenia and thrombocytopenia. She been on methotrexate and Plaquenil for many years for underlying rheumatoid arthritis. She underwent a bone marrow biopsy in November of last year that was relatively unremarkable. She has no new complaints today her methotrexate was stopped several weeks ago because of her persistent leukopenia. She states she feels no different off the medication; she is still on her Plaquenil. She follows with a rheumatologist in [place].  She does not get routine mammograms she states she was advised that they were optional. She has had a screening colonoscopy.  MEDICAL HISTORY: Past Medical History  Diagnosis Date  . Arthritis   . GERD (gastroesophageal reflux disease)     has Pancytopenia and Rheumatoid arthritis on her problem list.     has No Known Allergies.  Ms. Melissa Villanueva does not currently have medications on file.  SURGICAL HISTORY: Past Surgical History  Procedure Laterality Date  . Total hip arthroplasty Left   . Cholecystectomy    . Cesarean section    . Eye surgery  Sept 2015    Bilateral Glaucoma with surgery    SOCIAL HISTORY: History   Social History  . Marital Status: Widowed    Spouse Name: N/A  . Number of Children: N/A  . Years of Education: N/A   Occupational History  . Not on file.   Social History Main Topics  . Smoking status: Former Smoker    Quit date: 07/11/1986  . Smokeless tobacco: Never Used  . Alcohol Use: Yes     Comment: cocktail maybe once a month  . Drug Use: No  . Sexual Activity: Not on file    Other Topics Concern  . Not on file   Social History Narrative    FAMILY HISTORY: Family History  Problem Relation Age of Onset  . Cancer Maternal Aunt   . Cancer Paternal Aunt   . Cancer Brother     Review of Systems  Constitutional: Negative for fever, chills, weight loss and malaise/fatigue.  HENT: Negative for congestion, hearing loss, nosebleeds, sore throat and tinnitus.   Eyes: Negative for blurred vision, double vision, pain and discharge.  Respiratory: Negative for cough, hemoptysis, sputum production, shortness of breath and wheezing.   Cardiovascular: Negative for chest pain, palpitations, claudication, leg swelling and PND.  Gastrointestinal: Negative for heartburn, nausea, vomiting, abdominal pain, diarrhea, constipation, blood in stool and melena.  Genitourinary: Negative for dysuria, urgency, frequency and hematuria.  Musculoskeletal: Negative for myalgias, joint pain and falls.  Skin: Negative for itching and rash.  Neurological: Negative for dizziness, tingling, tremors, sensory change, speech change, focal weakness, seizures, loss of consciousness, weakness and headaches.  Endo/Heme/Allergies: Does not bruise/bleed easily.  Psychiatric/Behavioral: Negative for depression, suicidal ideas, memory loss and substance abuse. The patient is not nervous/anxious and does not have insomnia.     PHYSICAL EXAMINATION  ECOG PERFORMANCE STATUS: 0 - Asymptomatic  There were no vitals filed for this visit.  Physical Exam  Constitutional: She is oriented to person, place, and time and well-developed, well-nourished, and in no distress.  HENT:  Head: Normocephalic and atraumatic.  Nose: Nose normal.  Mouth/Throat: Oropharynx is clear and moist. No oropharyngeal exudate.  Eyes: Conjunctivae and EOM are normal. Pupils are equal, round, and reactive to light. Right eye exhibits no discharge. Left eye exhibits no discharge. No scleral icterus.  Neck: Normal range of motion.  Neck supple. No tracheal deviation present. No thyromegaly present.  Cardiovascular: Normal rate, regular rhythm and normal heart sounds.  Exam reveals no gallop and no friction rub.   No murmur heard. Pulmonary/Chest: Effort normal and breath sounds normal. She has no wheezes. She has no rales.  Abdominal: Soft. Bowel sounds are normal. She exhibits no distension and no mass. There is no tenderness. There is no rebound and no guarding.  Musculoskeletal: Normal range of motion. She exhibits no edema.  No significant joint deformities  Lymphadenopathy:    She has no cervical adenopathy.  Neurological: She is alert and oriented to person, place, and time. She has normal reflexes. No cranial nerve deficit. Gait normal. Coordination normal.  Skin: Skin is warm and dry. No rash noted.  Psychiatric: Mood, memory, affect and judgment normal.  Nursing note and vitals reviewed.   LABORATORY DATA:  CBC    Component Value Date/Time   WBC 3.9* 10/16/2014 1455   RBC 3.71* 10/16/2014 1455   RBC 3.75* 09/22/2014 1604   HGB 12.0 10/16/2014 1455   HCT 36.1 10/16/2014 1455   PLT 119* 10/16/2014 1455   MCV 97.3 10/16/2014 1455   MCH 32.3 10/16/2014 1455   MCHC 33.2 10/16/2014 1455   RDW 13.8 10/16/2014 1455   LYMPHSABS 1.0 10/16/2014 1455   MONOABS 0.2 10/16/2014 1455   EOSABS 0.0 10/16/2014 1455   BASOSABS 0.0 10/16/2014 1455   CMP     Component Value Date/Time   NA 142 09/22/2014 1604   K 3.7 09/22/2014 1604   CL 104 09/22/2014 1604   CO2 28 09/22/2014 1604   GLUCOSE 88 09/22/2014 1604   BUN 12 09/22/2014 1604   CREATININE 0.86 09/22/2014 1604   CALCIUM 10.3 09/22/2014 1604   PROT 7.3 09/22/2014 1604   ALBUMIN 4.0 09/22/2014 1604   AST 27 09/22/2014 1604   ALT 12 09/22/2014 1604   ALKPHOS 64 09/22/2014 1604   BILITOT 0.6 09/22/2014 1604   GFRNONAA 68* 09/22/2014 1604   GFRAA 79* 09/22/2014 1604     ASSESSMENT and THERAPY PLAN:   Pancytopenia  69 year old female with  rheumatoid arthritis. She is being followed for pancytopenia. White count is normal today. She has recently been taken off of her methotrexate. I advised her that currently am not concerned about her counts. Her medications can certainly cause mild suppression of white count and platelet count, in addition rheumatologic disease can also cause leukopenia/thrombocytopenia.  We will continue with ongoing observation. If she has new problems prior to follow-up advised her to let us know. She does agree to a screening mammogram and we will get this ordered for her today.    All questions were answered. The patient knows to call the clinic with any problems, questions or concerns. We can certainly see the patient much sooner if necessary. This note was electronically signed. Molli Hazard 02/23/2015

## 2015-03-04 DIAGNOSIS — M1711 Unilateral primary osteoarthritis, right knee: Secondary | ICD-10-CM | POA: Diagnosis not present

## 2015-03-09 DIAGNOSIS — M1711 Unilateral primary osteoarthritis, right knee: Secondary | ICD-10-CM | POA: Diagnosis not present

## 2015-03-09 DIAGNOSIS — M65342 Trigger finger, left ring finger: Secondary | ICD-10-CM | POA: Diagnosis not present

## 2015-03-09 DIAGNOSIS — M069 Rheumatoid arthritis, unspecified: Secondary | ICD-10-CM | POA: Diagnosis not present

## 2015-03-09 DIAGNOSIS — M8580 Other specified disorders of bone density and structure, unspecified site: Secondary | ICD-10-CM | POA: Diagnosis not present

## 2015-03-11 DIAGNOSIS — M1711 Unilateral primary osteoarthritis, right knee: Secondary | ICD-10-CM | POA: Diagnosis not present

## 2015-03-19 DIAGNOSIS — M1711 Unilateral primary osteoarthritis, right knee: Secondary | ICD-10-CM | POA: Diagnosis not present

## 2015-04-03 ENCOUNTER — Ambulatory Visit (HOSPITAL_COMMUNITY)
Admission: RE | Admit: 2015-04-03 | Discharge: 2015-04-03 | Disposition: A | Discharge status: Home / Self Care | Payer: Medicare Other | Source: Ambulatory Visit | Attending: Hematology & Oncology | Admitting: Hematology & Oncology

## 2015-04-03 DIAGNOSIS — Z1231 Encounter for screening mammogram for malignant neoplasm of breast: Secondary | ICD-10-CM | POA: Diagnosis not present

## 2015-04-03 DIAGNOSIS — Z139 Encounter for screening, unspecified: Secondary | ICD-10-CM

## 2015-04-30 DIAGNOSIS — M1711 Unilateral primary osteoarthritis, right knee: Secondary | ICD-10-CM | POA: Diagnosis not present

## 2015-05-20 DIAGNOSIS — Z23 Encounter for immunization: Secondary | ICD-10-CM | POA: Diagnosis not present

## 2015-05-20 DIAGNOSIS — N390 Urinary tract infection, site not specified: Secondary | ICD-10-CM | POA: Diagnosis not present

## 2015-05-20 DIAGNOSIS — M859 Disorder of bone density and structure, unspecified: Secondary | ICD-10-CM | POA: Diagnosis not present

## 2015-05-20 DIAGNOSIS — Z0000 Encounter for general adult medical examination without abnormal findings: Secondary | ICD-10-CM | POA: Diagnosis not present

## 2015-05-20 DIAGNOSIS — E785 Hyperlipidemia, unspecified: Secondary | ICD-10-CM | POA: Diagnosis not present

## 2015-05-20 DIAGNOSIS — Z1389 Encounter for screening for other disorder: Secondary | ICD-10-CM | POA: Diagnosis not present

## 2015-05-20 DIAGNOSIS — I5032 Chronic diastolic (congestive) heart failure: Secondary | ICD-10-CM | POA: Diagnosis not present

## 2015-05-27 DIAGNOSIS — N183 Chronic kidney disease, stage 3 (moderate): Secondary | ICD-10-CM | POA: Diagnosis not present

## 2015-05-27 DIAGNOSIS — D709 Neutropenia, unspecified: Secondary | ICD-10-CM | POA: Diagnosis not present

## 2015-05-27 DIAGNOSIS — D696 Thrombocytopenia, unspecified: Secondary | ICD-10-CM | POA: Diagnosis not present

## 2015-05-27 DIAGNOSIS — I5032 Chronic diastolic (congestive) heart failure: Secondary | ICD-10-CM | POA: Diagnosis not present

## 2015-06-05 DIAGNOSIS — M25551 Pain in right hip: Secondary | ICD-10-CM | POA: Diagnosis not present

## 2015-06-05 DIAGNOSIS — M1711 Unilateral primary osteoarthritis, right knee: Secondary | ICD-10-CM | POA: Diagnosis not present

## 2015-07-23 DIAGNOSIS — M8580 Other specified disorders of bone density and structure, unspecified site: Secondary | ICD-10-CM | POA: Diagnosis not present

## 2015-07-23 DIAGNOSIS — M0589 Other rheumatoid arthritis with rheumatoid factor of multiple sites: Secondary | ICD-10-CM | POA: Diagnosis not present

## 2015-07-23 DIAGNOSIS — D61818 Other pancytopenia: Secondary | ICD-10-CM | POA: Diagnosis not present

## 2015-07-23 DIAGNOSIS — M150 Primary generalized (osteo)arthritis: Secondary | ICD-10-CM | POA: Diagnosis not present

## 2015-07-31 DIAGNOSIS — E785 Hyperlipidemia, unspecified: Secondary | ICD-10-CM | POA: Diagnosis not present

## 2015-07-31 DIAGNOSIS — M179 Osteoarthritis of knee, unspecified: Secondary | ICD-10-CM | POA: Diagnosis not present

## 2015-07-31 DIAGNOSIS — M069 Rheumatoid arthritis, unspecified: Secondary | ICD-10-CM | POA: Diagnosis not present

## 2015-07-31 DIAGNOSIS — K219 Gastro-esophageal reflux disease without esophagitis: Secondary | ICD-10-CM | POA: Diagnosis not present

## 2015-08-26 ENCOUNTER — Ambulatory Visit (HOSPITAL_COMMUNITY): Payer: Medicare Other | Admitting: Hematology & Oncology

## 2015-08-26 ENCOUNTER — Other Ambulatory Visit (HOSPITAL_COMMUNITY): Payer: Medicare Other

## 2015-08-26 NOTE — Progress Notes (Signed)
This encounter was created in error - please disregard.

## 2015-08-28 ENCOUNTER — Encounter (HOSPITAL_COMMUNITY): Payer: Self-pay

## 2015-09-09 ENCOUNTER — Other Ambulatory Visit (HOSPITAL_COMMUNITY): Payer: Self-pay

## 2015-09-16 ENCOUNTER — Encounter (HOSPITAL_COMMUNITY): Payer: Medicare Other | Attending: Oncology | Admitting: Oncology

## 2015-09-16 ENCOUNTER — Encounter (HOSPITAL_COMMUNITY): Payer: Self-pay | Admitting: Oncology

## 2015-09-16 ENCOUNTER — Encounter (HOSPITAL_BASED_OUTPATIENT_CLINIC_OR_DEPARTMENT_OTHER): Payer: Medicare Other

## 2015-09-16 VITALS — BP 134/93 | HR 63 | Temp 98.1°F | Resp 16 | Wt 206.4 lb

## 2015-09-16 DIAGNOSIS — M069 Rheumatoid arthritis, unspecified: Secondary | ICD-10-CM | POA: Diagnosis not present

## 2015-09-16 DIAGNOSIS — D61818 Other pancytopenia: Secondary | ICD-10-CM

## 2015-09-16 DIAGNOSIS — Z23 Encounter for immunization: Secondary | ICD-10-CM | POA: Diagnosis not present

## 2015-09-16 LAB — COMPREHENSIVE METABOLIC PANEL
ALT: 15 U/L (ref 14–54)
AST: 28 U/L (ref 15–41)
Albumin: 4.3 g/dL (ref 3.5–5.0)
Alkaline Phosphatase: 52 U/L (ref 38–126)
Anion gap: 6 (ref 5–15)
BUN: 13 mg/dL (ref 6–20)
CHLORIDE: 104 mmol/L (ref 101–111)
CO2: 31 mmol/L (ref 22–32)
CREATININE: 0.92 mg/dL (ref 0.44–1.00)
Calcium: 9.9 mg/dL (ref 8.9–10.3)
GFR calc Af Amer: >60 mL/min (ref >60)
GLUCOSE: 101 mg/dL — AB (ref 65–99)
Potassium: 4.1 mmol/L (ref 3.5–5.1)
SODIUM: 141 mmol/L (ref 135–145)
Total Bilirubin: 0.7 mg/dL (ref 0.3–1.2)
Total Protein: 7.4 g/dL (ref 6.5–8.1)

## 2015-09-16 LAB — CBC WITH DIFFERENTIAL/PLATELET
BASOS ABS: 0 10*3/uL (ref 0.0–0.1)
Basophils Relative: 1 %
Eosinophils Absolute: 0 10*3/uL (ref 0.0–0.7)
Eosinophils Relative: 1 %
HCT: 38.4 % (ref 36.0–46.0)
Hemoglobin: 12.7 g/dL (ref 12.0–15.0)
LYMPHS PCT: 21 %
Lymphs Abs: 1 10*3/uL (ref 0.7–4.0)
MCH: 32.4 pg (ref 26.0–34.0)
MCHC: 33.1 g/dL (ref 30.0–36.0)
MCV: 98 fL (ref 78.0–100.0)
Monocytes Absolute: 0.3 10*3/uL (ref 0.1–1.0)
Monocytes Relative: 6 %
Neutro Abs: 3.6 10*3/uL (ref 1.7–7.7)
Neutrophils Relative %: 71 %
Platelets: 136 10*3/uL — ABNORMAL LOW (ref 150–400)
RBC: 3.92 MIL/uL (ref 3.87–5.11)
RDW: 14.3 % (ref 11.5–15.5)
WBC: 5 10*3/uL (ref 4.0–10.5)

## 2015-09-16 NOTE — Patient Instructions (Signed)
[place] at [facility] Discharge Instructions  RECOMMENDATIONS MADE BY THE CONSULTANT AND ANY TEST RESULTS WILL BE SENT TO YOUR REFERRING PHYSICIAN.  Exam and discussion by Robynn Pane, PA-C Labs are stable Call with any concerns  Follow-up in 9 months.  Thank you for choosing [place] at [facility] to provide your oncology and hematology care.  To afford each patient quality time with our provider, please arrive at least 15 minutes before your scheduled appointment time.    You need to re-schedule your appointment should you arrive 10 or more minutes late.  We strive to give you quality time with our providers, and arriving late affects you and other patients whose appointments are after yours.  Also, if you no show three or more times for appointments you may be dismissed from the clinic at the providers discretion.     Again, thank you for choosing [facility].  Our hope is that these requests will decrease the amount of time that you wait before being seen by our physicians.       _____________________________________________________________  Should you have questions after your visit to [facility], please contact our office at [phone] between the hours of 8:30 a.m. and 4:30 p.m.  Voicemails left after 4:30 p.m. will not be returned until the following business day.  For prescription refill requests, have your pharmacy contact our office.

## 2015-09-16 NOTE — Assessment & Plan Note (Addendum)
h/o pancytopenia in the setting of RA on treatment with Plaquenil, previously treated with Plaquenil and MTX.  Now with only mild thrombocytopenia since discontinuation of MTX.  Her Rheumatologist is Dr. Amil Amen in [place].  Labs today: CBC diff, CMET  I personally reviewed and went over laboratory results with the patient.  The results are noted within this dictation.  Will not set-up a lab appointment in 9 months as she often has labs performed by other healthcare providers.  As a result, she will bring [place] a copy.  If all is stable or improved, we will not need to draw labs.  Return in 9 months for follow-up.  If her labs are stable in 9 months, considering annual appointment is certainly reasonable.

## 2015-09-16 NOTE — Progress Notes (Signed)
Fanny Skates, MD [place] [address]  Pancytopenia [facility])  CURRENT THERAPY: Observation  INTERVAL HISTORY: Melissa Villanueva 69 y.o. female returns for followup of h/o pancytopenia in the setting of RA on treatment with Plaquenil, previously treated with Plaquenil and MTX.  I personally reviewed and went over laboratory results with the patient.  The results are noted within this dictation.  Labs are updated today.  She has mild thrombocytopenia, but normal HGB and WBC.  She denies any hematologic issues.  She notes since stopping MTX, her pain is stable to slightly worse.  She continues on Plaquenil.  She is provided education of RA-induced and Plaquenil-induced thrombocytopenia.  Her platelet count has improved some.  It is very stable and safe.  No need to stop this medication from a platelet standpoint.     Past Medical History  Diagnosis Date  . Arthritis   . GERD (gastroesophageal reflux disease)   . Pancytopenia ([place]) 2016    has Pancytopenia ([place]) and Rheumatoid arthritis ([place]) on her problem list.     has No Known Allergies.  Current Outpatient Prescriptions on File Prior to Visit  Medication Sig Dispense Refill  . Acetaminophen (TYLENOL ARTHRITIS PAIN PO) Take 1 capsule by mouth every 6 (six) hours as needed (for pain).     . Calcium Carbonate-Vitamin D (CALCIUM 600/VITAMIN D) 600-400 MG-UNIT per tablet Take 1 tablet by mouth daily.    . famotidine (PEPCID) 40 MG tablet Take 40 mg by mouth daily.    . folic acid (FOLVITE) 277 MCG tablet Take 400 mcg by mouth daily.    . hydroxychloroquine (PLAQUENIL) 200 MG tablet Take 400 mg by mouth daily.     . Probiotic Product (PROBIOTIC PO) Take 1 capsule by mouth daily.    . solifenacin (VESICARE) 5 MG tablet Take 5 mg by mouth daily.    . Glucosamine-Chondroit-Vit C-Mn (GLUCOSAMINE CHONDR 1500 COMPLX) CAPS Take 2 capsules by mouth daily.      No current facility-administered medications on  file prior to visit.    Past Surgical History  Procedure Laterality Date  . Total hip arthroplasty Left   . Cholecystectomy    . Cesarean section    . Eye surgery  Sept 2015    Bilateral Glaucoma with surgery    Denies any headaches, dizziness, double vision, fevers, chills, night sweats, nausea, vomiting, diarrhea, constipation, chest pain, heart palpitations, shortness of breath, blood in stool, black tarry stool, urinary pain, urinary burning, urinary frequency, hematuria.   PHYSICAL EXAMINATION  ECOG PERFORMANCE STATUS: 1 - Symptomatic but completely ambulatory  Filed Vitals:   09/16/15 1314  BP: 134/93  Pulse: 63  Temp: 98.1 F (36.7 C)  Resp: 16    GENERAL:alert, no distress, well nourished, well developed, comfortable, cooperative, obese, smiling and unaccompanied SKIN: skin color, texture, turgor are normal, no rashes or significant lesions HEAD: Normocephalic, No masses, lesions, tenderness or abnormalities EYES: normal, PERRLA, EOMI, Conjunctiva are pink and non-injected EARS: External ears normal OROPHARYNX:lips, buccal mucosa, and tongue normal and mucous membranes are moist  NECK: supple, no adenopathy, thyroid normal size, non-tender, without nodularity, no stridor, non-tender, trachea midline LYMPH:  no palpable lymphadenopathy BREAST:not examined LUNGS: clear to auscultation  HEART: regular rate & rhythm ABDOMEN:abdomen soft, non-tender, obese and normal bowel sounds BACK: Back symmetric, no curvature., No CVA tenderness EXTREMITIES:less then 2 second capillary refill, no skin discoloration, no cyanosis, positive findings:  Edema and joint changes noted on  PIP of #1 phalanges of B/L hands.  NEURO: alert & oriented x 3 with fluent speech, no focal motor/sensory deficits   LABORATORY DATA: CBC    Component Value Date/Time   WBC 5.0 09/16/2015 1306   RBC 3.92 09/16/2015 1306   RBC 3.75* 09/22/2014 1604   HGB 12.7 09/16/2015 1306   HCT 38.4 09/16/2015  1306   PLT 136* 09/16/2015 1306   MCV 98.0 09/16/2015 1306   MCH 32.4 09/16/2015 1306   MCHC 33.1 09/16/2015 1306   RDW 14.3 09/16/2015 1306   LYMPHSABS 1.0 09/16/2015 1306   MONOABS 0.3 09/16/2015 1306   EOSABS 0.0 09/16/2015 1306   BASOSABS 0.0 09/16/2015 1306      Chemistry      Component Value Date/Time   NA 142 09/22/2014 1604   K 3.7 09/22/2014 1604   CL 104 09/22/2014 1604   CO2 28 09/22/2014 1604   BUN 12 09/22/2014 1604   CREATININE 0.86 09/22/2014 1604      Component Value Date/Time   CALCIUM 10.3 09/22/2014 1604   ALKPHOS 64 09/22/2014 1604   AST 27 09/22/2014 1604   ALT 12 09/22/2014 1604   BILITOT 0.6 09/22/2014 1604        PENDING LABS:   RADIOGRAPHIC STUDIES:  No results found.   PATHOLOGY:    ASSESSMENT AND PLAN:  Pancytopenia ([place]) h/o pancytopenia in the setting of RA on treatment with Plaquenil, previously treated with Plaquenil and MTX.  Now with only mild thrombocytopenia since discontinuation of MTX.  Her Rheumatologist is Dr. Amil Amen in [place].  Labs today: CBC diff, CMET  I personally reviewed and went over laboratory results with the patient.  The results are noted within this dictation.  Will not set-up a lab appointment in 9 months as she often has labs performed by other healthcare providers.  As a result, she will bring [place] a copy.  If all is stable or improved, we will not need to draw labs.  Return in 9 months for follow-up.  If her labs are stable in 9 months, considering annual appointment is certainly reasonable.     THERAPY PLAN:  Will try to avoid repeating unnecessary labs.  Continue with observation of counts.  All questions were answered. The patient knows to call the clinic with any problems, questions or concerns. We can certainly see the patient much sooner if necessary.  Patient and plan discussed with Dr. Ancil Linsey and she is in agreement with the aforementioned.   This note is electronically signed by:  Doy Mince 09/16/2015 2:11 PM

## 2015-09-17 DIAGNOSIS — M1711 Unilateral primary osteoarthritis, right knee: Secondary | ICD-10-CM | POA: Diagnosis not present

## 2015-09-18 NOTE — Progress Notes (Signed)
LABS DRAWN

## 2015-09-22 ENCOUNTER — Ambulatory Visit: Payer: Self-pay | Admitting: Orthopedic Surgery

## 2015-09-22 NOTE — Progress Notes (Signed)
Preoperative surgical orders have been place into the Epic hospital system for Melissa Villanueva on 09/22/2015, 5:27 PM  by Mickel Crow for surgery on 10-12-2015.  Preop Total Knee orders including Experal, IV Tylenol, and IV Decadron as long as there are no contraindications to the above medications. Arlee Muslim, PA-C

## 2015-09-30 NOTE — Patient Instructions (Addendum)
Jasmane Brockway  09/30/2015   Your procedure is scheduled on:   10/12/2015    Report to [facility] Main  Entrance take [place]  elevators to 3rd floor to  [place] at     [place] AM.  Call this number if you have problems the morning of surgery [phone]   Remember: ONLY 1 PERSON MAY GO WITH YOU TO SHORT STAY TO GET  READY MORNING OF [place].  Do not eat food or drink liquids :After Midnight.     Take these medicines the morning of surgery with A SIP OF WATER: Pepcid, Vesicare                                   You may not have any metal on your body including hair pins and              piercings  Do not wear jewelry, make-up, lotions, powders or perfumes, deodorant             Do not wear nail polish.  Do not shave  48 hours prior to surgery.                 Do not bring valuables to the hospital. [place].  Contacts, dentures or bridgework may not be worn into surgery.  Leave suitcase in the car. After surgery it may be brought to your room.         Special Instructions: coughing and deep breathing exercises, leg exercises               Please read over the following fact sheets you were given: _____________________________________________________________________             [facility] - Preparing for Surgery Before surgery, you can play an important role.  Because skin is not sterile, your skin needs to be as free of germs as possible.  You can reduce the number of germs on your skin by washing with CHG (chlorahexidine gluconate) soap before surgery.  CHG is an antiseptic cleaner which kills germs and bonds with the skin to continue killing germs even after washing. Please DO NOT use if you have an allergy to CHG or antibacterial soaps.  If your skin becomes reddened/irritated stop using the CHG and inform your nurse when you arrive at Short Stay. Do not shave (including legs and  underarms) for at least 48 hours prior to the first CHG shower.  You may shave your face/neck. Please follow these instructions carefully:  1.  Shower with CHG Soap the night before surgery and the  morning of Surgery.  2.  If you choose to wash your hair, wash your hair first as usual with your  normal  shampoo.  3.  After you shampoo, rinse your hair and body thoroughly to remove the  shampoo.                           4.  Use CHG as you would any other liquid soap.  You can apply chg directly  to the skin and wash  Gently with a scrungie or clean washcloth.  5.  Apply the CHG Soap to your body ONLY FROM THE NECK DOWN.   Do not use on face/ open                           Wound or open sores. Avoid contact with eyes, ears mouth and genitals (private parts).                       Wash face,  Genitals (private parts) with your normal soap.             6.  Wash thoroughly, paying special attention to the area where your surgery  will be performed.  7.  Thoroughly rinse your body with warm water from the neck down.  8.  DO NOT shower/wash with your normal soap after using and rinsing off  the CHG Soap.                9.  Pat yourself dry with a clean towel.            10.  Wear clean pajamas.            11.  Place clean sheets on your bed the night of your first shower and do not  sleep with pets. Day of Surgery : Do not apply any lotions/deodorants the morning of surgery.  Please wear clean clothes to the hospital/surgery center.  FAILURE TO FOLLOW THESE INSTRUCTIONS MAY RESULT IN THE CANCELLATION OF YOUR SURGERY PATIENT SIGNATURE_________________________________  NURSE SIGNATURE__________________________________  ________________________________________________________________________  WHAT IS A BLOOD TRANSFUSION? Blood Transfusion Information  A transfusion is the replacement of blood or some of its parts. Blood is made up of multiple cells which provide different  functions.  Red blood cells carry oxygen and are used for blood loss replacement.  White blood cells fight against infection.  Platelets control bleeding.  Plasma helps clot blood.  Other blood products are available for specialized needs, such as hemophilia or other clotting disorders. BEFORE THE TRANSFUSION  Who gives blood for transfusions?   Healthy volunteers who are fully evaluated to make sure their blood is safe. This is blood bank blood. Transfusion therapy is the safest it has ever been in the practice of medicine. Before blood is taken from a donor, a complete history is taken to make sure that person has no history of diseases nor engages in risky social behavior (examples are intravenous drug use or sexual activity with multiple partners). The donor's travel history is screened to minimize risk of transmitting infections, such as malaria. The donated blood is tested for signs of infectious diseases, such as HIV and hepatitis. The blood is then tested to be sure it is compatible with you in order to minimize the chance of a transfusion reaction. If you or a relative donates blood, this is often done in anticipation of surgery and is not appropriate for emergency situations. It takes many days to process the donated blood. RISKS AND COMPLICATIONS Although transfusion therapy is very safe and saves many lives, the main dangers of transfusion include:  1. Getting an infectious disease. 2. Developing a transfusion reaction. This is an allergic reaction to something in the blood you were given. Every precaution is taken to prevent this. The decision to have a blood transfusion has been considered carefully by your caregiver before blood is given. Blood is not given unless the benefits outweigh  the risks. AFTER THE TRANSFUSION  Right after receiving a blood transfusion, you will usually feel much better and more energetic. This is especially true if your red blood cells have gotten low  (anemic). The transfusion raises the level of the red blood cells which carry oxygen, and this usually causes an energy increase.  The nurse administering the transfusion will monitor you carefully for complications. HOME CARE INSTRUCTIONS  No special instructions are needed after a transfusion. You may find your energy is better. Speak with your caregiver about any limitations on activity for underlying diseases you may have. SEEK MEDICAL CARE IF:   Your condition is not improving after your transfusion.  You develop redness or irritation at the intravenous (IV) site. SEEK IMMEDIATE MEDICAL CARE IF:  Any of the following symptoms occur over the next 12 hours:  Shaking chills.  You have a temperature by mouth above 102 F (38.9 C), not controlled by medicine.  Chest, back, or muscle pain.  People around you feel you are not acting correctly or are confused.  Shortness of breath or difficulty breathing.  Dizziness and fainting.  You get a rash or develop hives.  You have a decrease in urine output.  Your urine turns a dark color or changes to pink, red, or brown. Any of the following symptoms occur over the next 10 days:  You have a temperature by mouth above 102 F (38.9 C), not controlled by medicine.  Shortness of breath.  Weakness after normal activity.  The white part of the eye turns yellow (jaundice).  You have a decrease in the amount of urine or are urinating less often.  Your urine turns a dark color or changes to pink, red, or brown. Document Released: 11/11/2000 Document Revised: 02/06/2012 Document Reviewed: 06/30/2008 ExitCare Patient Information 2014 [place].  _______________________________________________________________________  Incentive Spirometer  An incentive spirometer is a tool that can help keep your lungs clear and active. This tool measures how well you are filling your lungs with each breath. Taking long deep breaths may help  reverse or decrease the chance of developing breathing (pulmonary) problems (especially infection) following:  A long period of time when you are unable to move or be active. BEFORE THE PROCEDURE   If the spirometer includes an indicator to show your best effort, your nurse or respiratory therapist will set it to a desired goal.  If possible, sit up straight or lean slightly forward. Try not to slouch.  Hold the incentive spirometer in an upright position. INSTRUCTIONS FOR USE  3. Sit on the edge of your bed if possible, or sit up as far as you can in bed or on a chair. 4. Hold the incentive spirometer in an upright position. 5. Breathe out normally. 6. Place the mouthpiece in your mouth and seal your lips tightly around it. 7. Breathe in slowly and as deeply as possible, raising the piston or the ball toward the top of the column. 8. Hold your breath for 3-5 seconds or for as long as possible. Allow the piston or ball to fall to the bottom of the column. 9. Remove the mouthpiece from your mouth and breathe out normally. 10. Rest for a few seconds and repeat Steps 1 through 7 at least 10 times every 1-2 hours when you are awake. Take your time and take a few normal breaths between deep breaths. 11. The spirometer may include an indicator to show your best effort. Use the indicator as a goal to work  toward during each repetition. 12. After each set of 10 deep breaths, practice coughing to be sure your lungs are clear. If you have an incision (the cut made at the time of surgery), support your incision when coughing by placing a pillow or rolled up towels firmly against it. Once you are able to get out of bed, walk around indoors and cough well. You may stop using the incentive spirometer when instructed by your caregiver.  RISKS AND COMPLICATIONS  Take your time so you do not get dizzy or light-headed.  If you are in pain, you may need to take or ask for pain medication before doing  incentive spirometry. It is harder to take a deep breath if you are having pain. AFTER USE  Rest and breathe slowly and easily.  It can be helpful to keep track of a log of your progress. Your caregiver can provide you with a simple table to help with this. If you are using the spirometer at home, follow these instructions: [place] IF:   You are having difficultly using the spirometer.  You have trouble using the spirometer as often as instructed.  Your pain medication is not giving enough relief while using the spirometer.  You develop fever of 100.5 F (38.1 C) or higher. SEEK IMMEDIATE MEDICAL CARE IF:   You cough up bloody sputum that had not been present before.  You develop fever of 102 F (38.9 C) or greater.  You develop worsening pain at or near the incision site. MAKE SURE YOU:   Understand these instructions.  Will watch your condition.  Will get help right away if you are not doing well or get worse. Document Released: 03/27/2007 Document Revised: 02/06/2012 Document Reviewed: 05/28/2007 [facility] Patient Information 2014 [place], [place].   ________________________________________________________________________

## 2015-10-01 ENCOUNTER — Encounter (HOSPITAL_COMMUNITY): Payer: Self-pay

## 2015-10-01 ENCOUNTER — Encounter (HOSPITAL_COMMUNITY)
Admission: RE | Admit: 2015-10-01 | Discharge: 2015-10-01 | Disposition: A | Discharge status: Home / Self Care | Payer: Medicare Other | Source: Ambulatory Visit | Attending: Orthopedic Surgery | Admitting: Orthopedic Surgery

## 2015-10-01 DIAGNOSIS — M179 Osteoarthritis of knee, unspecified: Secondary | ICD-10-CM | POA: Diagnosis not present

## 2015-10-01 DIAGNOSIS — Z01818 Encounter for other preprocedural examination: Secondary | ICD-10-CM | POA: Insufficient documentation

## 2015-10-01 DIAGNOSIS — Z7901 Long term (current) use of anticoagulants: Secondary | ICD-10-CM | POA: Insufficient documentation

## 2015-10-01 HISTORY — DX: Stress incontinence (female) (male): N39.3

## 2015-10-01 HISTORY — DX: Headache: R51

## 2015-10-01 HISTORY — DX: Nausea with vomiting, unspecified: R11.2

## 2015-10-01 HISTORY — DX: Headache, unspecified: R51.9

## 2015-10-01 HISTORY — DX: Other specified postprocedural states: Z98.890

## 2015-10-01 LAB — URINALYSIS, ROUTINE W REFLEX MICROSCOPIC
Bilirubin Urine: NEGATIVE
Glucose, UA: NEGATIVE mg/dL
Ketones, ur: NEGATIVE mg/dL
NITRITE: NEGATIVE
Protein, ur: NEGATIVE mg/dL
SPECIFIC GRAVITY, URINE: 1.02 (ref 1.005–1.030)
UROBILINOGEN UA: 0.2 mg/dL (ref 0.0–1.0)
pH: 6.5 (ref 5.0–8.0)

## 2015-10-01 LAB — CBC
HEMATOCRIT: 38.4 % (ref 36.0–46.0)
Hemoglobin: 12.5 g/dL (ref 12.0–15.0)
MCH: 32.5 pg (ref 26.0–34.0)
MCHC: 32.6 g/dL (ref 30.0–36.0)
MCV: 99.7 fL (ref 78.0–100.0)
Platelets: 148 10*3/uL — ABNORMAL LOW (ref 150–400)
RBC: 3.85 MIL/uL — ABNORMAL LOW (ref 3.87–5.11)
RDW: 14.2 % (ref 11.5–15.5)
WBC: 5.5 10*3/uL (ref 4.0–10.5)

## 2015-10-01 LAB — URINE MICROSCOPIC-ADD ON

## 2015-10-01 LAB — COMPREHENSIVE METABOLIC PANEL
ALT: 15 U/L (ref 14–54)
AST: 26 U/L (ref 15–41)
Albumin: 4.1 g/dL (ref 3.5–5.0)
Alkaline Phosphatase: 53 U/L (ref 38–126)
Anion gap: 6 (ref 5–15)
BILIRUBIN TOTAL: 0.6 mg/dL (ref 0.3–1.2)
BUN: 18 mg/dL (ref 6–20)
CO2: 32 mmol/L (ref 22–32)
CREATININE: 0.98 mg/dL (ref 0.44–1.00)
Calcium: 9.8 mg/dL (ref 8.9–10.3)
Chloride: 105 mmol/L (ref 101–111)
GFR calc Af Amer: >60 mL/min (ref >60)
GFR, EST NON AFRICAN AMERICAN: 58 mL/min — AB (ref >60)
Glucose, Bld: 105 mg/dL — ABNORMAL HIGH (ref 65–99)
Potassium: 4.6 mmol/L (ref 3.5–5.1)
Sodium: 143 mmol/L (ref 135–145)
TOTAL PROTEIN: 6.9 g/dL (ref 6.5–8.1)

## 2015-10-01 LAB — APTT: aPTT: 33 seconds (ref 24–37)

## 2015-10-01 LAB — ABO/RH: ABO/RH(D): O POS

## 2015-10-01 LAB — TYPE AND SCREEN
ABO/RH(D): O POS
ANTIBODY SCREEN: NEGATIVE

## 2015-10-01 LAB — PROTIME-INR
INR: 1.09 (ref 0.00–1.49)
PROTHROMBIN TIME: 14.3 s (ref 11.6–15.2)

## 2015-10-01 LAB — SURGICAL PCR SCREEN
MRSA, PCR: NEGATIVE
STAPHYLOCOCCUS AUREUS: NEGATIVE

## 2015-10-01 NOTE — Progress Notes (Addendum)
Clearance- Dr Alyson Ingles- 07/31/2015 on chart- signed by Berton Bon signed by Dr Ashby Dawes EKG- 07/31/2015 on chart  LOV- Dr Aaron Edelman Mckenzie-07/31/15 on chart

## 2015-10-01 NOTE — Progress Notes (Signed)
U/A and micro results done 10/01/15 faxed via EPIC to Dr Wynelle Link.

## 2015-10-11 ENCOUNTER — Ambulatory Visit: Payer: Self-pay | Admitting: Orthopedic Surgery

## 2015-10-11 NOTE — H&P (Signed)
Melissa Villanueva DOB: Feb 25, 1946 Widowed / Language: [name] / Race: White Female Date of Admission:  10/12/2015 CC:  Right Knee Pain History of Present Illness The patient is a 69 year old female who comes in for a preoperative History and Physical. The patient is scheduled for a right total knee arthroplasty to be performed by Dr. Dione Plover. Aluisio, MD at [facility] on 10-12-2015. The patient is a 69 year old female who presented for follow up of their knee. The patient is being followed for their right knee pain and osteoarthritis. They are months out from Synvisc series. Symptoms reported include: pain (mostly medial) and instability. The patient feels that they are doing well and report their pain level to be mild to moderate. Current treatment includes: bracing and NSAIDs (Celebrex, with an occasional Aleve). The following medication has been used for pain control: antiinflammatory medication. The patient has reported improvement of their symptoms with: viscosupplementation (it's not helping anymore). The patient indicates that they have questions or concerns today regarding their progress at this point and surgery. Melissa Villanueva came back in for a recheck of the right knee. She had gel series earlier this year whcih only helped for a short amount of time. the injections have worn off and she is interested in knee replacement at this time. The knee pain has gotten worse with time and now becoming more unstable. She notices that the knee is "slipping" and wanting to buckle on her as she is walking. That buckling has gotten progressively worse with time. The knee does not feel stable to her anymore. She has been told in the past that she would need a repalcement and has now reached a point where she would like to pursue surgery.  Risks and benefits of the surgery have been discussed with the patient and they elect to proceed with surgery.  There are on active contraindications to  upcoming procedure such as ongoing infection or progressive neurological disease.   Problem List/Past Medical Primary osteoarthritis of right knee (M17.11) Contusion of right knee, initial encounter (S80.01XA) Right knee pain (M25.561) Primary osteoarthritis of right hip (123456) Diastolic congestive heart failure (I50.30) CRF (chronic renal failure) (N18.9) Chronic Neutropenia Chronic Thrombocytopenia Osteoporosis Rheumatoid Arthritis Varicose veins Hemorrhoids Urinary Incontinence Gastroesophageal Reflux Disease Hyperlipidemia  Allergies No Known Drug Allergies  Family History Heart Disease Mother. Heart disease in female family member before age 72 First Degree Relatives reported Cancer Brother. Drug / Alcohol Addiction Father.  Social History Number of flights of stairs before winded 1 Tobacco / smoke exposure 07/01/2014: no Tobacco use Former smoker. 07/01/2014: smoke(d) 1 pack(s) per day Not under pain contract Children 5 or more Living situation live alone Marital status widowed Exercise Exercises rarely; does running / walking Current drinker 07/01/2014: Currently drinks wine only occasionally per week Current work status retired  Medication History Calcium with Vitamin D Active. Plaquenil (200MG  Tablet, Oral) Active. CeleBREX (200MG  Capsule, Oral) Active. VESIcare (5MG  Tablet, Oral) Active. Famotidine (40MG  Tablet, Oral) Active. Probiotic (Oral) Active. Mobic (15MG  Tablet, Oral) Active. Calcium-Vitamin D (Oral) Specific dose unknown - Active.  Past Surgical History Gallbladder Surgery Date: 1989. open Total Hip Replacement Date: 2009. left C-Section Date: 70. Cataract Surgery - Bilateral Date: 2016.   Review of Systems General Not Present- Chills, Fatigue, Fever, Memory Loss, Night Sweats, Weight Gain and Weight Loss. Skin Not Present- Eczema, Hives, Itching, Lesions and Rash. HEENT Present- Headache  and Hearing Loss. Not Present- Dentures, Double Vision, Tinnitus and Visual Loss.  Respiratory Present- Shortness of breath with exertion. Not Present- Allergies, Chronic Cough, Coughing up blood and Shortness of breath at rest. Cardiovascular Present- Swelling of Extremities. Not Present- Chest Pain, Difficulty Breathing Lying Down, Murmur, Palpitations, Racing/skipping heartbeats and Swelling. Gastrointestinal Present- Constipation and Heartburn. Not Present- Abdominal Pain, Bloody Stool, Diarrhea, Difficulty Swallowing, Jaundice, Loss of appetitie, Nausea and Vomiting. Female Genitourinary Present- Incontinence and Urinating at Night. Not Present- Blood in Urine, Discharge, Flank Pain, Painful Urination, Urgency, Urinary frequency, Urinary Retention and Weak urinary stream. Musculoskeletal Present- Morning Stiffness. Not Present- Back Pain, Joint Pain, Joint Swelling, Muscle Pain, Muscle Weakness and Spasms. Neurological Not Present- Blackout spells, Difficulty with balance, Dizziness, Paralysis, Tremor and Weakness. Psychiatric Not Present- Insomnia.  Vitals  Weight: 205 lb Height: 61.5in Body Surface Area: 1.92 m Body Mass Index: 38.11 kg/m  BP: 138/82 (Sitting, Right Arm, Standard)  Physical Exam  General Mental Status -Alert, cooperative and good historian. General Appearance-pleasant, Not in acute distress. Orientation-Oriented X3. Build & Nutrition-Well nourished and Well developed. Note: short stature  Head and Neck Head-normocephalic, atraumatic . Neck Global Assessment - supple, no bruit auscultated on the right, no bruit auscultated on the left.  Eye Vision-Wears corrective lenses. Pupil - Bilateral-Regular and Round. Motion - Bilateral-EOMI.  Chest and Lung Exam Inspection Shape - Kyphotic(thoracic spine). Auscultation Breath sounds - clear at anterior chest wall and clear at posterior chest wall. Adventitious sounds - No Adventitious  sounds.  Cardiovascular Auscultation Rhythm - Regular rate and rhythm. Heart Sounds - S1 WNL and S2 WNL. Murmurs & Other Heart Sounds - Auscultation of the heart reveals - No Murmurs.  Abdomen Palpation/Percussion Tenderness - Abdomen is non-tender to palpation. Rigidity (guarding) - Abdomen is soft. Auscultation Auscultation of the abdomen reveals - Bowel sounds normal.  Female Genitourinary Note: Not done, not pertinent to present illness   Musculoskeletal Note: Well-developed female. Alert and oriented, in no apparent distress. Evaluation of her left hip shows normal range of motion without discomfort. Right hip flexion to 120, rotation in 30, out 40, abduction 40 without discomfort. Left knee, no effusion, range 0-125, slight tenderness medially, no lateral tenderness, no instability. Right knee, no effusion, range about 5-120, moderate crepitus on range of motion with tenderness medial and lateral. No instability noted. Pulse, sensation and motor intact. She has significantly antalgic gait pattern.  RADIOGRAPHS AP pelvis and lateral of the right hip, show no arthritis, acute on chronic bony abnormalities. I reviewed her knee films, AP and lateral, right knee is still having significant end-stage arthritis, bone on bone, with osteophyte formation, medial patellofemoral.  Assessment & Plan  Primary osteoarthritis of right knee (M17.11) Note:Surgical Plans: Right Total Knee Replacement  Disposition: Home  PCP: Dr. Selina Cooley - Patient has been seen preoperatively and felt to be stable for surgery.  IV TXA  Anesthesia Issues: Nausea and Vomiting in the past  Signed electronically by Joelene Millin, III PA-C

## 2015-10-12 ENCOUNTER — Inpatient Hospital Stay (HOSPITAL_COMMUNITY): Payer: Medicare Other | Admitting: Certified Registered Nurse Anesthetist

## 2015-10-12 ENCOUNTER — Encounter (HOSPITAL_COMMUNITY)
Admission: RE | Disposition: A | Discharge status: Home Health | Payer: Self-pay | Source: Ambulatory Visit | Attending: Orthopedic Surgery

## 2015-10-12 ENCOUNTER — Encounter (HOSPITAL_COMMUNITY): Payer: Self-pay | Admitting: *Deleted

## 2015-10-12 ENCOUNTER — Inpatient Hospital Stay (HOSPITAL_COMMUNITY)
Admission: RE | Admit: 2015-10-12 | Discharge: 2015-10-14 | DRG: 470 | Disposition: A | Discharge status: Home Health | Payer: Medicare Other | Source: Ambulatory Visit | Attending: Orthopedic Surgery | Admitting: Orthopedic Surgery

## 2015-10-12 DIAGNOSIS — M1711 Unilateral primary osteoarthritis, right knee: Principal | ICD-10-CM | POA: Diagnosis present

## 2015-10-12 DIAGNOSIS — M179 Osteoarthritis of knee, unspecified: Secondary | ICD-10-CM | POA: Diagnosis not present

## 2015-10-12 DIAGNOSIS — N393 Stress incontinence (female) (male): Secondary | ICD-10-CM | POA: Diagnosis present

## 2015-10-12 DIAGNOSIS — Z96642 Presence of left artificial hip joint: Secondary | ICD-10-CM | POA: Diagnosis present

## 2015-10-12 DIAGNOSIS — M25561 Pain in right knee: Secondary | ICD-10-CM | POA: Diagnosis not present

## 2015-10-12 DIAGNOSIS — Z01812 Encounter for preprocedural laboratory examination: Secondary | ICD-10-CM | POA: Diagnosis not present

## 2015-10-12 DIAGNOSIS — E785 Hyperlipidemia, unspecified: Secondary | ICD-10-CM | POA: Diagnosis present

## 2015-10-12 DIAGNOSIS — Z87891 Personal history of nicotine dependence: Secondary | ICD-10-CM

## 2015-10-12 DIAGNOSIS — M810 Age-related osteoporosis without current pathological fracture: Secondary | ICD-10-CM | POA: Diagnosis present

## 2015-10-12 DIAGNOSIS — M1710 Unilateral primary osteoarthritis, unspecified knee: Secondary | ICD-10-CM | POA: Diagnosis present

## 2015-10-12 DIAGNOSIS — K219 Gastro-esophageal reflux disease without esophagitis: Secondary | ICD-10-CM | POA: Diagnosis not present

## 2015-10-12 HISTORY — PX: TOTAL KNEE ARTHROPLASTY: SHX125

## 2015-10-12 SURGERY — ARTHROPLASTY, KNEE, TOTAL
Anesthesia: Spinal | Site: Knee | Laterality: Right

## 2015-10-12 MED ORDER — ONDANSETRON HCL 4 MG/2ML IJ SOLN
INTRAMUSCULAR | Status: DC | PRN
  Administered 2015-10-12: 4 mg via INTRAVENOUS

## 2015-10-12 MED ORDER — FAMOTIDINE 40 MG PO TABS
40.0000 mg | ORAL_TABLET | Freq: Every day | ORAL | Status: DC
  Administered 2015-10-13 – 2015-10-14 (×2): 40 mg via ORAL
  Filled 2015-10-12 (×2): qty 1

## 2015-10-12 MED ORDER — PROPOFOL 10 MG/ML IV BOLUS
INTRAVENOUS | Status: AC
  Filled 2015-10-12: qty 20

## 2015-10-12 MED ORDER — CEFAZOLIN SODIUM-DEXTROSE 2-3 GM-% IV SOLR
2.0000 g | INTRAVENOUS | Status: AC
  Administered 2015-10-12: 2 g via INTRAVENOUS

## 2015-10-12 MED ORDER — FENTANYL CITRATE (PF) 100 MCG/2ML IJ SOLN
INTRAMUSCULAR | Status: AC
  Filled 2015-10-12: qty 4

## 2015-10-12 MED ORDER — BUPIVACAINE HCL 0.25 % IJ SOLN
INTRAMUSCULAR | Status: DC | PRN
  Administered 2015-10-12: 20 mL

## 2015-10-12 MED ORDER — DIPHENHYDRAMINE HCL 12.5 MG/5ML PO ELIX: 12.5000 mg | ORAL_SOLUTION | ORAL | Status: DC | PRN

## 2015-10-12 MED ORDER — PROMETHAZINE HCL 25 MG/ML IJ SOLN
6.2500 mg | INTRAMUSCULAR | Status: DC | PRN
Start: 2015-10-12 — End: 2015-10-12
  Administered 2015-10-12: 6.25 mg via INTRAVENOUS

## 2015-10-12 MED ORDER — RIVAROXABAN 10 MG PO TABS
10.0000 mg | ORAL_TABLET | Freq: Every day | ORAL | Status: DC
  Administered 2015-10-13 – 2015-10-14 (×2): 10 mg via ORAL
  Filled 2015-10-12 (×3): qty 1

## 2015-10-12 MED ORDER — ACETAMINOPHEN 325 MG PO TABS: 650.0000 mg | ORAL_TABLET | Freq: Four times a day (QID) | ORAL | Status: DC | PRN

## 2015-10-12 MED ORDER — BISACODYL 10 MG RE SUPP: 10.0000 mg | Freq: Every day | RECTAL | Status: DC | PRN

## 2015-10-12 MED ORDER — PROPOFOL 500 MG/50ML IV EMUL
INTRAVENOUS | Status: DC | PRN
Start: 2015-10-12 — End: 2015-10-12
  Administered 2015-10-12: 50 ug/kg/min via INTRAVENOUS

## 2015-10-12 MED ORDER — DOCUSATE SODIUM 100 MG PO CAPS
100.0000 mg | ORAL_CAPSULE | Freq: Two times a day (BID) | ORAL | Status: DC
  Administered 2015-10-12 – 2015-10-14 (×3): 100 mg via ORAL

## 2015-10-12 MED ORDER — FENTANYL CITRATE (PF) 100 MCG/2ML IJ SOLN
INTRAMUSCULAR | Status: DC | PRN
  Administered 2015-10-12 (×2): 50 ug via INTRAVENOUS

## 2015-10-12 MED ORDER — DEXAMETHASONE SODIUM PHOSPHATE 10 MG/ML IJ SOLN
INTRAMUSCULAR | Status: AC
  Filled 2015-10-12: qty 1

## 2015-10-12 MED ORDER — ACETAMINOPHEN 650 MG RE SUPP: 650.0000 mg | Freq: Four times a day (QID) | RECTAL | Status: DC | PRN

## 2015-10-12 MED ORDER — BUPIVACAINE LIPOSOME 1.3 % IJ SUSP
INTRAMUSCULAR | Status: DC | PRN
  Administered 2015-10-12: 20 mL

## 2015-10-12 MED ORDER — LACTATED RINGERS IV SOLN
INTRAVENOUS | Status: DC
  Administered 2015-10-12: 1000 mL via INTRAVENOUS
  Administered 2015-10-12 (×2): via INTRAVENOUS

## 2015-10-12 MED ORDER — SODIUM CHLORIDE 0.9 % IJ SOLN
INTRAMUSCULAR | Status: DC | PRN
  Administered 2015-10-12: 30 mL

## 2015-10-12 MED ORDER — FLEET ENEMA 7-19 GM/118ML RE ENEM: 1.0000 | ENEMA | Freq: Once | RECTAL | Status: DC | PRN

## 2015-10-12 MED ORDER — EPHEDRINE SULFATE 50 MG/ML IJ SOLN
INTRAMUSCULAR | Status: AC
  Filled 2015-10-12: qty 1

## 2015-10-12 MED ORDER — ONDANSETRON HCL 4 MG/2ML IJ SOLN
4.0000 mg | Freq: Four times a day (QID) | INTRAMUSCULAR | Status: DC | PRN
  Administered 2015-10-14: 4 mg via INTRAVENOUS
  Filled 2015-10-12: qty 2

## 2015-10-12 MED ORDER — MIDAZOLAM HCL 5 MG/5ML IJ SOLN
INTRAMUSCULAR | Status: DC | PRN
  Administered 2015-10-12: 2 mg via INTRAVENOUS

## 2015-10-12 MED ORDER — POLYETHYLENE GLYCOL 3350 17 G PO PACK: 17.0000 g | PACK | Freq: Every day | ORAL | Status: DC | PRN

## 2015-10-12 MED ORDER — BUPIVACAINE IN DEXTROSE 0.75-8.25 % IT SOLN
INTRATHECAL | Status: DC | PRN
  Administered 2015-10-12: 1.8 mL via INTRATHECAL

## 2015-10-12 MED ORDER — METHOCARBAMOL 1000 MG/10ML IJ SOLN
500.0000 mg | Freq: Four times a day (QID) | INTRAMUSCULAR | Status: DC | PRN
  Administered 2015-10-12: 500 mg via INTRAVENOUS
  Filled 2015-10-12 (×2): qty 5

## 2015-10-12 MED ORDER — CEFAZOLIN SODIUM-DEXTROSE 2-3 GM-% IV SOLR
INTRAVENOUS | Status: AC
  Filled 2015-10-12: qty 50

## 2015-10-12 MED ORDER — METOCLOPRAMIDE HCL 5 MG/ML IJ SOLN: 5.0000 mg | Freq: Three times a day (TID) | INTRAMUSCULAR | Status: DC | PRN

## 2015-10-12 MED ORDER — DEXAMETHASONE SODIUM PHOSPHATE 10 MG/ML IJ SOLN
10.0000 mg | Freq: Once | INTRAMUSCULAR | Status: AC
  Administered 2015-10-12: 10 mg via INTRAVENOUS

## 2015-10-12 MED ORDER — FENTANYL CITRATE (PF) 100 MCG/2ML IJ SOLN
INTRAMUSCULAR | Status: AC
  Filled 2015-10-12: qty 2

## 2015-10-12 MED ORDER — SODIUM CHLORIDE 0.45 % IV SOLN
INTRAVENOUS | Status: DC
  Administered 2015-10-12 (×2): via INTRAVENOUS

## 2015-10-12 MED ORDER — DARIFENACIN HYDROBROMIDE ER 7.5 MG PO TB24
7.5000 mg | ORAL_TABLET | Freq: Every day | ORAL | Status: DC
  Administered 2015-10-13 – 2015-10-14 (×2): 7.5 mg via ORAL
  Filled 2015-10-12 (×2): qty 1

## 2015-10-12 MED ORDER — SODIUM CHLORIDE 0.9 % IV SOLN: INTRAVENOUS | Status: DC

## 2015-10-12 MED ORDER — KETOROLAC TROMETHAMINE 15 MG/ML IJ SOLN
7.5000 mg | Freq: Four times a day (QID) | INTRAMUSCULAR | Status: AC | PRN
  Administered 2015-10-12: 7.5 mg via INTRAVENOUS
  Filled 2015-10-12: qty 1

## 2015-10-12 MED ORDER — MEPERIDINE HCL 50 MG/ML IJ SOLN: 6.2500 mg | INTRAMUSCULAR | Status: DC | PRN

## 2015-10-12 MED ORDER — TRANEXAMIC ACID 1000 MG/10ML IV SOLN
1000.0000 mg | INTRAVENOUS | Status: AC
  Administered 2015-10-12: 1000 mg via INTRAVENOUS
  Filled 2015-10-12: qty 10

## 2015-10-12 MED ORDER — METOCLOPRAMIDE HCL 10 MG PO TABS: 5.0000 mg | ORAL_TABLET | Freq: Three times a day (TID) | ORAL | Status: DC | PRN

## 2015-10-12 MED ORDER — OXYCODONE HCL 5 MG PO TABS
5.0000 mg | ORAL_TABLET | ORAL | Status: DC | PRN
  Administered 2015-10-12: 5 mg via ORAL
  Administered 2015-10-12 – 2015-10-13 (×2): 10 mg via ORAL
  Administered 2015-10-13: 5 mg via ORAL
  Administered 2015-10-13 – 2015-10-14 (×3): 10 mg via ORAL
  Administered 2015-10-14: 5 mg via ORAL
  Administered 2015-10-14: 10 mg via ORAL
  Filled 2015-10-12 (×2): qty 2
  Filled 2015-10-12 (×2): qty 1
  Filled 2015-10-12 (×3): qty 2
  Filled 2015-10-12: qty 1
  Filled 2015-10-12 (×2): qty 2

## 2015-10-12 MED ORDER — SODIUM CHLORIDE 0.9 % IJ SOLN
INTRAMUSCULAR | Status: AC
Start: 2015-10-12 — End: 2015-10-12
  Filled 2015-10-12: qty 10

## 2015-10-12 MED ORDER — CEFAZOLIN SODIUM-DEXTROSE 2-3 GM-% IV SOLR
2.0000 g | Freq: Four times a day (QID) | INTRAVENOUS | Status: AC
  Administered 2015-10-12 – 2015-10-13 (×2): 2 g via INTRAVENOUS
  Filled 2015-10-12 (×2): qty 50

## 2015-10-12 MED ORDER — CHLORHEXIDINE GLUCONATE 4 % EX LIQD: 60.0000 mL | Freq: Once | CUTANEOUS | Status: DC

## 2015-10-12 MED ORDER — ONDANSETRON HCL 4 MG/2ML IJ SOLN
INTRAMUSCULAR | Status: AC
  Filled 2015-10-12: qty 2

## 2015-10-12 MED ORDER — PHENOL 1.4 % MT LIQD
1.0000 | OROMUCOSAL | Status: DC | PRN
  Filled 2015-10-12: qty 177

## 2015-10-12 MED ORDER — PROPOFOL 10 MG/ML IV BOLUS
INTRAVENOUS | Status: DC | PRN
  Administered 2015-10-12: 20 mg via INTRAVENOUS

## 2015-10-12 MED ORDER — TRAMADOL HCL 50 MG PO TABS: 50.0000 mg | ORAL_TABLET | Freq: Four times a day (QID) | ORAL | Status: DC | PRN

## 2015-10-12 MED ORDER — FENTANYL CITRATE (PF) 100 MCG/2ML IJ SOLN
25.0000 ug | INTRAMUSCULAR | Status: DC | PRN
  Administered 2015-10-12: 50 ug via INTRAVENOUS

## 2015-10-12 MED ORDER — PROMETHAZINE HCL 25 MG/ML IJ SOLN
INTRAMUSCULAR | Status: AC
  Filled 2015-10-12: qty 1

## 2015-10-12 MED ORDER — BUPIVACAINE HCL (PF) 0.25 % IJ SOLN
INTRAMUSCULAR | Status: AC
Start: 2015-10-12 — End: 2015-10-12
  Filled 2015-10-12: qty 30

## 2015-10-12 MED ORDER — ACETAMINOPHEN 10 MG/ML IV SOLN
1000.0000 mg | Freq: Once | INTRAVENOUS | Status: AC
  Administered 2015-10-12: 1000 mg via INTRAVENOUS

## 2015-10-12 MED ORDER — MORPHINE SULFATE (PF) 2 MG/ML IV SOLN: 1.0000 mg | INTRAVENOUS | Status: DC | PRN

## 2015-10-12 MED ORDER — MENTHOL 3 MG MT LOZG: 1.0000 | LOZENGE | OROMUCOSAL | Status: DC | PRN

## 2015-10-12 MED ORDER — DEXAMETHASONE SODIUM PHOSPHATE 10 MG/ML IJ SOLN
10.0000 mg | Freq: Once | INTRAMUSCULAR | Status: AC
  Administered 2015-10-13: 10 mg via INTRAVENOUS
  Filled 2015-10-12: qty 1

## 2015-10-12 MED ORDER — BUPIVACAINE LIPOSOME 1.3 % IJ SUSP
20.0000 mL | Freq: Once | INTRAMUSCULAR | Status: DC
  Filled 2015-10-12: qty 20

## 2015-10-12 MED ORDER — SODIUM CHLORIDE 0.9 % IJ SOLN
INTRAMUSCULAR | Status: AC
  Filled 2015-10-12: qty 50

## 2015-10-12 MED ORDER — METHOCARBAMOL 500 MG PO TABS
500.0000 mg | ORAL_TABLET | Freq: Four times a day (QID) | ORAL | Status: DC | PRN
  Administered 2015-10-13 – 2015-10-14 (×3): 500 mg via ORAL
  Filled 2015-10-12 (×3): qty 1

## 2015-10-12 MED ORDER — ONDANSETRON HCL 4 MG PO TABS: 4.0000 mg | ORAL_TABLET | Freq: Four times a day (QID) | ORAL | Status: DC | PRN

## 2015-10-12 MED ORDER — PHENYLEPHRINE HCL 10 MG/ML IJ SOLN
INTRAMUSCULAR | Status: DC | PRN
  Administered 2015-10-12 (×4): 40 ug via INTRAVENOUS
  Administered 2015-10-12: 80 ug via INTRAVENOUS
  Administered 2015-10-12 (×3): 40 ug via INTRAVENOUS

## 2015-10-12 MED ORDER — ACETAMINOPHEN 500 MG PO TABS
1000.0000 mg | ORAL_TABLET | Freq: Four times a day (QID) | ORAL | Status: AC
  Administered 2015-10-12 – 2015-10-13 (×3): 1000 mg via ORAL
  Filled 2015-10-12 (×3): qty 2

## 2015-10-12 MED ORDER — MIDAZOLAM HCL 2 MG/2ML IJ SOLN
INTRAMUSCULAR | Status: AC
  Filled 2015-10-12: qty 4

## 2015-10-12 SURGICAL SUPPLY — 50 items
BAG DECANTER FOR FLEXI CONT (MISCELLANEOUS) IMPLANT
BAG ZIPLOCK 12X15 (MISCELLANEOUS) ×3 IMPLANT
BANDAGE ELASTIC 6 VELCRO ST LF (GAUZE/BANDAGES/DRESSINGS) ×3 IMPLANT
BLADE SAG 18X100X1.27 (BLADE) ×3 IMPLANT
BLADE SAW SGTL 11.0X1.19X90.0M (BLADE) ×3 IMPLANT
BOWL SMART MIX CTS (DISPOSABLE) ×3 IMPLANT
CAPT KNEE TOTAL 3 ATTUNE ×3 IMPLANT
CEMENT HV SMART SET (Cement) ×6 IMPLANT
CLOSURE WOUND 1/2 X4 (GAUZE/BANDAGES/DRESSINGS) ×1
CLOTH BEACON ORANGE TIMEOUT ST (SAFETY) ×3 IMPLANT
CUFF TOURN SGL QUICK 34 (TOURNIQUET CUFF) ×2
CUFF TRNQT CYL 34X4X40X1 (TOURNIQUET CUFF) ×1 IMPLANT
DECANTER SPIKE VIAL GLASS SM (MISCELLANEOUS) ×3 IMPLANT
DRAPE U-SHAPE 47X51 STRL (DRAPES) ×3 IMPLANT
DRSG ADAPTIC 3X8 NADH LF (GAUZE/BANDAGES/DRESSINGS) ×3 IMPLANT
DRSG PAD ABDOMINAL 8X10 ST (GAUZE/BANDAGES/DRESSINGS) ×3 IMPLANT
DURAPREP 26ML APPLICATOR (WOUND CARE) ×3 IMPLANT
ELECT REM PT RETURN 9FT ADLT (ELECTROSURGICAL) ×3
ELECTRODE REM PT RTRN 9FT ADLT (ELECTROSURGICAL) ×1 IMPLANT
EVACUATOR 1/8 PVC DRAIN (DRAIN) ×3 IMPLANT
GAUZE SPONGE 4X4 12PLY STRL (GAUZE/BANDAGES/DRESSINGS) ×3 IMPLANT
GLOVE BIO SURGEON STRL SZ7.5 (GLOVE) ×3 IMPLANT
GLOVE BIO SURGEON STRL SZ8 (GLOVE) ×3 IMPLANT
GLOVE BIOGEL PI IND STRL 6.5 (GLOVE) IMPLANT
GLOVE BIOGEL PI IND STRL 8 (GLOVE) ×2 IMPLANT
GLOVE BIOGEL PI INDICATOR 6.5 (GLOVE)
GLOVE BIOGEL PI INDICATOR 8 (GLOVE) ×4
GLOVE SURG SS PI 6.5 STRL IVOR (GLOVE) IMPLANT
GOWN STRL REUS W/TWL LRG LVL3 (GOWN DISPOSABLE) ×3 IMPLANT
GOWN STRL REUS W/TWL XL LVL3 (GOWN DISPOSABLE) ×3 IMPLANT
HANDPIECE INTERPULSE COAX TIP (DISPOSABLE) ×2
IMMOBILIZER KNEE 20 (SOFTGOODS) ×3
IMMOBILIZER KNEE 20 THIGH 36 (SOFTGOODS) ×1 IMPLANT
MANIFOLD NEPTUNE II (INSTRUMENTS) ×3 IMPLANT
NS IRRIG 1000ML POUR BTL (IV SOLUTION) ×3 IMPLANT
PACK TOTAL KNEE CUSTOM (KITS) ×3 IMPLANT
PADDING CAST COTTON 6X4 STRL (CAST SUPPLIES) ×3 IMPLANT
POSITIONER SURGICAL ARM (MISCELLANEOUS) ×3 IMPLANT
SET HNDPC FAN SPRY TIP SCT (DISPOSABLE) ×1 IMPLANT
STRIP CLOSURE SKIN 1/2X4 (GAUZE/BANDAGES/DRESSINGS) ×2 IMPLANT
SUT MNCRL AB 4-0 PS2 18 (SUTURE) ×3 IMPLANT
SUT VIC AB 2-0 CT1 27 (SUTURE) ×6
SUT VIC AB 2-0 CT1 TAPERPNT 27 (SUTURE) ×3 IMPLANT
SUT VLOC 180 0 24IN GS25 (SUTURE) ×3 IMPLANT
SYR 50ML LL SCALE MARK (SYRINGE) ×3 IMPLANT
TRAY FOLEY W/METER SILVER 14FR (SET/KITS/TRAYS/PACK) ×3 IMPLANT
TRAY FOLEY W/METER SILVER 16FR (SET/KITS/TRAYS/PACK) IMPLANT
WATER STERILE IRR 1500ML POUR (IV SOLUTION) ×3 IMPLANT
WRAP KNEE MAXI GEL POST OP (GAUZE/BANDAGES/DRESSINGS) ×3 IMPLANT
YANKAUER SUCT BULB TIP 10FT TU (MISCELLANEOUS) ×3 IMPLANT

## 2015-10-12 NOTE — Progress Notes (Signed)
Patient states she finished taking Cipro for UTI 

## 2015-10-12 NOTE — Interval H&P Note (Signed)
History and Physical Interval Note:  10/12/2015 10:28 AM  Melissa Villanueva  has presented today for surgery, with the diagnosis of OA RIGHT KNEE  The various methods of treatment have been discussed with the patient and family. After consideration of risks, benefits and other options for treatment, the patient has consented to  Procedure(s): RIGHT TOTAL KNEE ARTHROPLASTY (Right) as a surgical intervention .  The patient's history has been reviewed, patient examined, no change in status, stable for surgery.  I have reviewed the patient's chart and labs.  Questions were answered to the patient's satisfaction.     Gearlean Alf

## 2015-10-12 NOTE — Anesthesia Preprocedure Evaluation (Signed)
Anesthesia Evaluation  Patient identified by MRN, date of birth, ID band Patient awake    Reviewed: Allergy & Precautions, NPO status , Patient's Chart, lab work & pertinent test results  History of Anesthesia Complications (+) PONV  Airway Mallampati: II  TM Distance: >3 FB Neck ROM: Full    Dental no notable dental hx.    Pulmonary former smoker,    Pulmonary exam normal breath sounds clear to auscultation       Cardiovascular negative cardio ROS Normal cardiovascular exam Rhythm:Regular Rate:Normal     Neuro/Psych negative neurological ROS  negative psych ROS   GI/Hepatic negative GI ROS, Neg liver ROS,   Endo/Other  negative endocrine ROS  Renal/GU negative Renal ROS  negative genitourinary   Musculoskeletal negative musculoskeletal ROS (+)   Abdominal   Peds negative pediatric ROS (+)  Hematology negative hematology ROS (+)   Anesthesia Other Findings   Reproductive/Obstetrics negative OB ROS                             Anesthesia Physical Anesthesia Plan  ASA: II  Anesthesia Plan: Spinal   Post-op Pain Management:    Induction:   Airway Management Planned: Simple Face Mask  Additional Equipment:   Intra-op Plan:   Post-operative Plan:   Informed Consent: I have reviewed the patients History and Physical, chart, labs and discussed the procedure including the risks, benefits and alternatives for the proposed anesthesia with the patient or authorized representative who has indicated his/her understanding and acceptance.   Dental advisory given  Plan Discussed with: CRNA  Anesthesia Plan Comments:         Anesthesia Quick Evaluation

## 2015-10-12 NOTE — H&P (View-Only) (Signed)
Melissa Villanueva DOB: 01-07-1946 Widowed / Language: Melissa Villanueva / Race: White Female Date of Admission:  10/12/2015 CC:  Right Knee Pain History of Present Illness The patient is a 69 year old female who comes in for a preoperative History and Physical. The patient is scheduled for a right total knee arthroplasty to be performed by Dr. Dione Plover. Aluisio, MD at [facility] on 10-12-2015. The patient is a 69 year old female who presented for follow up of their knee. The patient is being followed for their right knee pain and osteoarthritis. They are months out from Synvisc series. Symptoms reported include: pain (mostly medial) and instability. The patient feels that they are doing well and report their pain level to be mild to moderate. Current treatment includes: bracing and NSAIDs (Celebrex, with an occasional Aleve). The following medication has been used for pain control: antiinflammatory medication. The patient has reported improvement of their symptoms with: viscosupplementation (it's not helping anymore). The patient indicates that they have questions or concerns today regarding their progress at this point and surgery. Melissa Villanueva came back in for a recheck of the right knee. She had gel series earlier this year whcih only helped for a short amount of time. the injections have worn off and she is interested in knee replacement at this time. The knee pain has gotten worse with time and now becoming more unstable. She notices that the knee is "slipping" and wanting to buckle on her as she is walking. That buckling has gotten progressively worse with time. The knee does not feel stable to her anymore. She has been told in the past that she would need a repalcement and has now reached a point where she would like to pursue surgery.  Risks and benefits of the surgery have been discussed with the patient and they elect to proceed with surgery.  There are on active contraindications to  upcoming procedure such as ongoing infection or progressive neurological disease.   Problem List/Past Medical Primary osteoarthritis of right knee (M17.11) Contusion of right knee, initial encounter (S80.01XA) Right knee pain (M25.561) Primary osteoarthritis of right hip (123456) Diastolic congestive heart failure (I50.30) CRF (chronic renal failure) (N18.9) Chronic Neutropenia Chronic Thrombocytopenia Osteoporosis Rheumatoid Arthritis Varicose veins Hemorrhoids Urinary Incontinence Gastroesophageal Reflux Disease Hyperlipidemia  Allergies No Known Drug Allergies  Family History Heart Disease Mother. Heart disease in female family member before age 26 First Degree Relatives reported Cancer Brother. Drug / Alcohol Addiction Father.  Social History Number of flights of stairs before winded 1 Tobacco / smoke exposure 07/01/2014: no Tobacco use Former smoker. 07/01/2014: smoke(d) 1 pack(s) per day Not under pain contract Children 5 or more Living situation live alone Marital status widowed Exercise Exercises rarely; does running / walking Current drinker 07/01/2014: Currently drinks wine only occasionally per week Current work status retired  Medication History Calcium with Vitamin D Active. Plaquenil (200MG  Tablet, Oral) Active. CeleBREX (200MG  Capsule, Oral) Active. VESIcare (5MG  Tablet, Oral) Active. Famotidine (40MG  Tablet, Oral) Active. Probiotic (Oral) Active. Mobic (15MG  Tablet, Oral) Active. Calcium-Vitamin D (Oral) Specific dose unknown - Active.  Past Surgical History Gallbladder Surgery Date: 1989. open Total Hip Replacement Date: 2009. left C-Section Date: 60. Cataract Surgery - Bilateral Date: 2016.   Review of Systems General Not Present- Chills, Fatigue, Fever, Memory Loss, Night Sweats, Weight Gain and Weight Loss. Skin Not Present- Eczema, Hives, Itching, Lesions and Rash. HEENT Present- Headache  and Hearing Loss. Not Present- Dentures, Double Vision, Tinnitus and Visual Loss.  Respiratory Present- Shortness of breath with exertion. Not Present- Allergies, Chronic Cough, Coughing up blood and Shortness of breath at rest. Cardiovascular Present- Swelling of Extremities. Not Present- Chest Pain, Difficulty Breathing Lying Down, Murmur, Palpitations, Racing/skipping heartbeats and Swelling. Gastrointestinal Present- Constipation and Heartburn. Not Present- Abdominal Pain, Bloody Stool, Diarrhea, Difficulty Swallowing, Jaundice, Loss of appetitie, Nausea and Vomiting. Female Genitourinary Present- Incontinence and Urinating at Night. Not Present- Blood in Urine, Discharge, Flank Pain, Painful Urination, Urgency, Urinary frequency, Urinary Retention and Weak urinary stream. Musculoskeletal Present- Morning Stiffness. Not Present- Back Pain, Joint Pain, Joint Swelling, Muscle Pain, Muscle Weakness and Spasms. Neurological Not Present- Blackout spells, Difficulty with balance, Dizziness, Paralysis, Tremor and Weakness. Psychiatric Not Present- Insomnia.  Vitals  Weight: 205 lb Height: 61.5in Body Surface Area: 1.92 m Body Mass Index: 38.11 kg/m  BP: 138/82 (Sitting, Right Arm, Standard)  Physical Exam  General Mental Status -Alert, cooperative and good historian. General Appearance-pleasant, Not in acute distress. Orientation-Oriented X3. Build & Nutrition-Well nourished and Well developed. Note: short stature  Head and Neck Head-normocephalic, atraumatic . Neck Global Assessment - supple, no bruit auscultated on the right, no bruit auscultated on the left.  Eye Vision-Wears corrective lenses. Pupil - Bilateral-Regular and Round. Motion - Bilateral-EOMI.  Chest and Lung Exam Inspection Shape - Kyphotic(thoracic spine). Auscultation Breath sounds - clear at anterior chest wall and clear at posterior chest wall. Adventitious sounds - No Adventitious  sounds.  Cardiovascular Auscultation Rhythm - Regular rate and rhythm. Heart Sounds - S1 WNL and S2 WNL. Murmurs & Other Heart Sounds - Auscultation of the heart reveals - No Murmurs.  Abdomen Palpation/Percussion Tenderness - Abdomen is non-tender to palpation. Rigidity (guarding) - Abdomen is soft. Auscultation Auscultation of the abdomen reveals - Bowel sounds normal.  Female Genitourinary Note: Not done, not pertinent to present illness   Musculoskeletal Note: Well-developed female. Alert and oriented, in no apparent distress. Evaluation of her left hip shows normal range of motion without discomfort. Right hip flexion to 120, rotation in 30, out 40, abduction 40 without discomfort. Left knee, no effusion, range 0-125, slight tenderness medially, no lateral tenderness, no instability. Right knee, no effusion, range about 5-120, moderate crepitus on range of motion with tenderness medial and lateral. No instability noted. Pulse, sensation and motor intact. She has significantly antalgic gait pattern.  RADIOGRAPHS AP pelvis and lateral of the right hip, show no arthritis, acute on chronic bony abnormalities. I reviewed her knee films, AP and lateral, right knee is still having significant end-stage arthritis, bone on bone, with osteophyte formation, medial patellofemoral.  Assessment & Plan  Primary osteoarthritis of right knee (M17.11) Note:Surgical Plans: Right Total Knee Replacement  Disposition: Home  PCP: Dr. Selina Cooley - Patient has been seen preoperatively and felt to be stable for surgery.  IV TXA  Anesthesia Issues: Nausea and Vomiting in the past  Signed electronically by Joelene Millin, III PA-C

## 2015-10-12 NOTE — Anesthesia Procedure Notes (Signed)
Spinal Patient location during procedure: OR Start time: 10/12/2015 12:18 PM End time: 10/12/2015 12:25 PM Staffing Anesthesiologist: [name] Resident/CRNA: Darlys Gales R Performed by: resident/CRNA  Preanesthetic Checklist Completed: patient identified, site marked, surgical consent, pre-op evaluation, timeout performed, IV checked, risks and benefits discussed and monitors and equipment checked Spinal Block Patient position: sitting Prep: Betadine Patient monitoring: heart rate, continuous pulse ox and blood pressure Location: L3-4 Injection technique: single-shot Needle Needle type: Spinocan  Needle gauge: 24 G Needle length: 10 cm Needle insertion depth: 9 cm Assessment Sensory level: T6 Additional Notes Expiration date of kit checked and confirmed. Patient tolerated procedure well, without complications. Lot [id number] exp 2017-01-25 Attempt x1 CRNA with 9cm spinocan needle (needle too short). Attempt x2 Sprotte 24gx160m. CSF freely flowing with aspiration pre and post injection. Patient reports loss of sensation and motor to LE.

## 2015-10-12 NOTE — Transfer of Care (Signed)
Immediate Anesthesia Transfer of Care Note  Patient: Melissa Villanueva  Procedure(s) Performed: Procedure(s): RIGHT TOTAL KNEE ARTHROPLASTY (Right)  Patient Location: PACU  Anesthesia Type:Spinal  Level of Consciousness: awake, oriented and patient cooperative  Airway & Oxygen Therapy: Patient Spontanous Breathing and Patient connected to face mask oxygen  Post-op Assessment: Report given to RN, Post -op Vital signs reviewed and stable and SAB level T10.  Post vital signs: Reviewed and stable  Last Vitals:  Filed Vitals:   10/12/15 0929  BP: 161/81  Pulse: 70  Temp: 36.7 C  Resp: 16    Complications: No apparent anesthesia complications

## 2015-10-12 NOTE — Op Note (Signed)
Pre-operative diagnosis- Osteoarthritis  Right knee(s)  Post-operative diagnosis- Osteoarthritis Right knee(s)  Procedure-  Right  Total Knee Arthroplasty  Surgeon- Dione Plover. Margretta Zamorano, MD  Assistant- Arlee Muslim, PA-C   Anesthesia-  Spinal  EBL-* No blood loss amount entered *   Drains Hemovac  Tourniquet time-  Total Tourniquet Time Documented: Thigh (Right) - 34 minutes Total: Thigh (Right) - 34 minutes     Complications- None  Condition-PACU - hemodynamically stable.   Brief Clinical Note  Melissa Villanueva is a 69 y.o. year old female with end stage OA of her right knee with progressively worsening pain and dysfunction. She has constant pain, with activity and at rest and significant functional deficits with difficulties even with ADLs. She has had extensive non-op management including analgesics, injections of cortisone and viscosupplements, and home exercise program, but remains in significant pain with significant dysfunction.Radiographs show bone on bone arthritis medial and patellofemoral. She presents now for right Total Knee Arthroplasty.    Procedure in detail---   The patient is brought into the operating room and positioned supine on the operating table. After successful administration of  Spinal,   a tourniquet is placed high on the  Right thigh(s) and the lower extremity is prepped and draped in the usual sterile fashion. Time out is performed by the operating team and then the  Right lower extremity is wrapped in Esmarch, knee flexed and the tourniquet inflated to 300 mmHg.       A midline incision is made with a ten blade through the subcutaneous tissue to the level of the extensor mechanism. A fresh blade is used to make a medial parapatellar arthrotomy. Soft tissue over the proximal medial tibia is subperiosteally elevated to the joint line with a knife and into the semimembranosus bursa with a Cobb elevator. Soft tissue over the proximal lateral tibia is elevated  with attention being paid to avoiding the patellar tendon on the tibial tubercle. The patella is everted, knee flexed 90 degrees and the ACL and PCL are removed. Findings are bone on bone medial and patellofemoral with large medial osteophytes.        The drill is used to create a starting hole in the distal femur and the canal is thoroughly irrigated with sterile saline to remove the fatty contents. The 5 degree Right  valgus alignment guide is placed into the femoral canal and the distal femoral cutting block is pinned to remove 10 mm off the distal femur. Resection is made with an oscillating saw.      The tibia is subluxed forward and the menisci are removed. The extramedullary alignment guide is placed referencing proximally at the medial aspect of the tibial tubercle and distally along the second metatarsal axis and tibial crest. The block is pinned to remove 36mm off the more deficient medial  side. Resection is made with an oscillating saw. Size 5is the most appropriate size for the tibia and the proximal tibia is prepared with the modular drill and keel punch for that size.      The femoral sizing guide is placed and size 5 is most appropriate. Rotation is marked off the epicondylar axis and confirmed by creating a rectangular flexion gap at 90 degrees. The size 5 cutting block is pinned in this rotation and the anterior, posterior and chamfer cuts are made with the oscillating saw. The intercondylar block is then placed and that cut is made.      Trial size 5 tibial component, trial size  5 posterior stabilized femur and a 8  mm posterior stabilized rotating platform insert trial is placed. Full extension is achieved with excellent varus/valgus and anterior/posterior balance throughout full range of motion. The patella is everted and thickness measured to be 22  mm. Free hand resection is taken to 12 mm, a 35 template is placed, lug holes are drilled, trial patella is placed, and it tracks normally.  Osteophytes are removed off the posterior femur with the trial in place. All trials are removed and the cut bone surfaces prepared with pulsatile lavage. Cement is mixed and once ready for implantation, the size 5 tibial implant, size  5 posterior stabilized femoral component, and the size 35 patella are cemented in place and the patella is held with the clamp. The trial insert is placed and the knee held in full extension. The Exparel (20 ml mixed with 30 ml saline) and .25% Bupivicaine, are injected into the extensor mechanism, posterior capsule, medial and lateral gutters and subcutaneous tissues.  All extruded cement is removed and once the cement is hard the permanent 8 mm posterior stabilized rotating platform insert is placed into the tibial tray.      The wound is copiously irrigated with saline solution and the extensor mechanism closed over a hemovac drain with #1 V-loc suture. The tourniquet is released for a total tourniquet time of 33  minutes. Flexion against gravity is 140 degrees and the patella tracks normally. Subcutaneous tissue is closed with 2.0 vicryl and subcuticular with running 4.0 Monocryl. The incision is cleaned and dried and steri-strips and a bulky sterile dressing are applied. The limb is placed into a knee immobilizer and the patient is awakened and transported to recovery in stable condition.      Please note that a surgical assistant was a medical necessity for this procedure in order to perform it in a safe and expeditious manner. Surgical assistant was necessary to retract the ligaments and vital neurovascular structures to prevent injury to them and also necessary for proper positioning of the limb to allow for anatomic placement of the prosthesis.   Dione Plover Randie Tallarico, MD    10/12/2015, 1:24 PM

## 2015-10-12 NOTE — Anesthesia Postprocedure Evaluation (Signed)
  Anesthesia Post-op Note  Patient: Melissa Villanueva  Procedure(s) Performed: Procedure(s) (LRB): RIGHT TOTAL KNEE ARTHROPLASTY (Right)  Patient Location: PACU  Anesthesia Type: Spinal  Level of Consciousness: awake and alert   Airway and Oxygen Therapy: Patient Spontanous Breathing  Post-op Pain: mild  Post-op Assessment: Post-op Vital signs reviewed, Patient's Cardiovascular Status Stable, Respiratory Function Stable, Patent Airway and No signs of Nausea or vomiting  Last Vitals:  Filed Vitals:   10/12/15 1415  BP: 129/55  Pulse: 56  Temp:   Resp: 14    Post-op Vital Signs: stable   Complications: No apparent anesthesia complications

## 2015-10-13 LAB — BASIC METABOLIC PANEL
Anion gap: 4 — ABNORMAL LOW (ref 5–15)
BUN: 14 mg/dL (ref 6–20)
CO2: 29 mmol/L (ref 22–32)
CREATININE: 0.84 mg/dL (ref 0.44–1.00)
Calcium: 8.6 mg/dL — ABNORMAL LOW (ref 8.9–10.3)
Chloride: 105 mmol/L (ref 101–111)
GFR calc Af Amer: >60 mL/min (ref >60)
Glucose, Bld: 136 mg/dL — ABNORMAL HIGH (ref 65–99)
Potassium: 3.8 mmol/L (ref 3.5–5.1)
SODIUM: 138 mmol/L (ref 135–145)

## 2015-10-13 LAB — CBC
HCT: 29.6 % — ABNORMAL LOW (ref 36.0–46.0)
Hemoglobin: 9.8 g/dL — ABNORMAL LOW (ref 12.0–15.0)
MCH: 32.3 pg (ref 26.0–34.0)
MCHC: 33.1 g/dL (ref 30.0–36.0)
MCV: 97.7 fL (ref 78.0–100.0)
PLATELETS: 121 10*3/uL — AB (ref 150–400)
RBC: 3.03 MIL/uL — ABNORMAL LOW (ref 3.87–5.11)
RDW: 14.4 % (ref 11.5–15.5)
WBC: 10.6 10*3/uL — ABNORMAL HIGH (ref 4.0–10.5)

## 2015-10-13 MED ORDER — TRAMADOL HCL 50 MG PO TABS: 50.0000 mg | ORAL_TABLET | Freq: Four times a day (QID) | ORAL | Status: DC | PRN

## 2015-10-13 MED ORDER — RIVAROXABAN 10 MG PO TABS: 10.0000 mg | ORAL_TABLET | Freq: Every day | ORAL | Status: DC

## 2015-10-13 MED ORDER — OXYCODONE HCL 5 MG PO TABS: 5.0000 mg | ORAL_TABLET | ORAL | Status: DC | PRN

## 2015-10-13 MED ORDER — METHOCARBAMOL 500 MG PO TABS: 500.0000 mg | ORAL_TABLET | Freq: Four times a day (QID) | ORAL | Status: DC | PRN

## 2015-10-13 NOTE — Progress Notes (Signed)
   Subjective: 1 Day Post-Op Procedure(s) (LRB): RIGHT TOTAL KNEE ARTHROPLASTY (Right) Patient reports pain as mild.   Patient seen in rounds with Dr. Wynelle Link.  Doing okay with pain controlled at this time. Patient is well, and has had no acute complaints or problems We will start therapy today.  Plan is to go Home after hospital stay.  Objective: Vital signs in last 24 hours: Temp:  [97.6 F (36.4 C)-98.5 F (36.9 C)] 98.2 F (36.8 C) (11/15 0652) Pulse Rate:  [51-73] 62 (11/15 0652) Resp:  [12-19] 16 (11/15 0652) BP: (112-161)/(47-83) 112/47 mmHg (11/15 0652) SpO2:  [95 %-100 %] 98 % (11/15 0652) Weight:  [92.534 kg (204 lb)-93 kg (205 lb 0.4 oz)] 93 kg (205 lb 0.4 oz) (11/14 1544)  Intake/Output from previous day:  Intake/Output Summary (Last 24 hours) at 10/13/15 0921 Last data filed at 10/13/15 [id number]  Gross per 24 hour  Intake   4055 ml  Output   2610 ml  Net   1445 ml    Labs:  Recent Labs  10/13/15 0515  HGB 9.8*    Recent Labs  10/13/15 0515  WBC 10.6*  RBC 3.03*  HCT 29.6*  PLT 121*    Recent Labs  10/13/15 0515  NA 138  K 3.8  CL 105  CO2 29  BUN 14  CREATININE 0.84  GLUCOSE 136*  CALCIUM 8.6*   No results for input(s): LABPT, INR in the last 72 hours.  EXAM General - Patient is Alert, Appropriate and Oriented Extremity - Neurovascular intact Sensation intact distally Dorsiflexion/Plantar flexion intact Dressing - dressing C/D/I Motor Function - intact, moving foot and toes well on exam.  Hemovac pulled without difficulty.  Past Medical History  Diagnosis Date  . Arthritis   . GERD (gastroesophageal reflux disease)   . Pancytopenia ([place]) 2016  . PONV (postoperative nausea and vomiting)   . Stress incontinence   . Headache     Assessment/Plan: 1 Day Post-Op Procedure(s) (LRB): RIGHT TOTAL KNEE ARTHROPLASTY (Right) Principal Problem:   OA (osteoarthritis) of knee  Estimated body mass index is 38.76 kg/(m^2) as calculated  from the following:   Height as of this encounter: 5\' 1"  (1.549 m).   Weight as of this encounter: 93 kg (205 lb 0.4 oz). Advance diet Up with therapy Plan for discharge tomorrow Discharge home with home health  DVT Prophylaxis - Xarelto Weight-Bearing as tolerated to right leg D/C O2 and Pulse OX and try on Room Air  Arlee Muslim, PA-C Orthopaedic Surgery 10/13/2015, 9:21 AM

## 2015-10-13 NOTE — Evaluation (Signed)
Occupational Therapy Evaluation Patient Details Name: Melissa Villanueva MRN: [id number] DOB: 1946/05/01 Today's Date: 10/13/2015    History of Present Illness Pt is a 69 year old female s/p R TKA   Clinical Impression   Pt was admitted for the above. All education was completed.  No further OT is needed at this time    Follow Up Recommendations  No OT follow up;Supervision/Assistance - 24 hour    Equipment Recommendations  None recommended by OT    Recommendations for Other Services       Precautions / Restrictions Precautions Precautions: Fall;Knee Required Braces or Orthoses: Knee Immobilizer - Right Knee Immobilizer - Right: Discontinue once straight leg raise with < 10 degree lag Restrictions Other Position/Activity Restrictions: WBAT      Mobility Bed Mobility Overal bed mobility: Needs Assistance Bed Mobility: Supine to Sit     Supine to sit: Supervision;HOB elevated     General bed mobility comments: oob  Transfers Overall transfer level: Needs assistance Equipment used: Rolling walker (2 wheeled) Transfers: Sit to/from Stand Sit to Stand: Min assist         General transfer comment: VCS for UE/LE placement    Balance                                            ADL Overall ADL's : Needs assistance/impaired             Lower Body Bathing: Minimal assistance;Sit to/from stand       Lower Body Dressing: Moderate assistance;Sit to/from stand   Toilet Transfer: Minimal assistance;Ambulation;BSC             General ADL Comments: pt has AE; plans to have daughter assist for 2 weeks.  Donned underwear and practied bathroom transfers.  Pt can perform UB adls with set up.  Pt verbalizes understanding of all education; donned KI during session     Vision     Perception     Praxis      Pertinent Vitals/Pain Pain Assessment: 0-10 Pain Score: 4  Pain Location: R knee Pain Descriptors / Indicators: Sore Pain  Intervention(s): Monitored during session     Hand Dominance     Extremity/Trunk Assessment Upper Extremity Assessment Upper Extremity Assessment: RUE deficits/detail RUE Deficits / Details: right shoulder painful after a fall 5 weeks ago          Communication Communication Communication: No difficulties   Cognition Arousal/Alertness: Awake/alert Behavior During Therapy: WFL for tasks assessed/performed Overall Cognitive Status: Within Functional Limits for tasks assessed                     General Comments       Exercises       Shoulder Instructions      Home Living Family/patient expects to be discharged to:: Private residence Living Arrangements: Alone Available Help at Discharge: Family (daughter staying x 2 weeks) Type of Home: House Home Access: Stairs to enter Technical brewer of Steps: 1 Entrance Stairs-Rails: None Home Layout: One level     Bathroom Shower/Tub: Walk-in shower;Tub/shower unit   Bathroom Toilet: Standard     Home Equipment: Walker - 2 wheels          Prior Functioning/Environment Level of Independence: Independent with assistive device(s)             OT Diagnosis: Acute pain  OT Problem List:     OT Treatment/Interventions:      OT Goals(Current goals can be found in the care plan section) Acute Rehab OT Goals Patient Stated Goal: home  OT Frequency:     Barriers to D/C:            Co-evaluation              End of Session    Activity Tolerance: Patient tolerated treatment well Patient left: in chair;with call bell/phone within reach;with chair alarm set   Time: [phone] OT Time Calculation (min): 25 min Charges:  OT General Charges $OT Visit: 1 Procedure OT Evaluation $Initial OT Evaluation Tier I: 1 Procedure OT Treatments $Self Care/Home Management : 8-22 mins G-Codes:    Denice Cardon 10/25/15, 2:30 PM   Lesle Chris, OTR/L [phone] 10-25-2015

## 2015-10-13 NOTE — Discharge Summary (Signed)
Physician Discharge Summary   Patient ID: Melissa Villanueva MRN: [id number] DOB/AGE: 05/31/1946 69 y.o.  Admit date: 10/12/2015 Discharge date: 10-14-2015  Primary Diagnosis:  Osteoarthritis Right knee(s) Admission Diagnoses:  Past Medical History  Diagnosis Date  . Arthritis   . GERD (gastroesophageal reflux disease)   . Pancytopenia ([place]) 2016  . PONV (postoperative nausea and vomiting)   . Stress incontinence   . Headache    Discharge Diagnoses:   Principal Problem:   OA (osteoarthritis) of knee  Estimated body mass index is 38.76 kg/(m^2) as calculated from the following:   Height as of this encounter: 5' 1" (1.549 m).   Weight as of this encounter: 93 kg (205 lb 0.4 oz).  Procedure:  Procedure(s) (LRB): RIGHT TOTAL KNEE ARTHROPLASTY (Right)   Consults: None  HPI: Melissa Villanueva is a 69 y.o. year old female with end stage OA of her right knee with progressively worsening pain and dysfunction. She has constant pain, with activity and at rest and significant functional deficits with difficulties even with ADLs. She has had extensive non-op management including analgesics, injections of cortisone and viscosupplements, and home exercise program, but remains in significant pain with significant dysfunction.Radiographs show bone on bone arthritis medial and patellofemoral. She presents now for right Total Knee Arthroplasty.  Laboratory Data: Admission on 10/12/2015  Component Date Value Ref Range Status  . WBC 10/13/2015 10.6* 4.0 - 10.5 K/uL Final  . RBC 10/13/2015 3.03* 3.87 - 5.11 MIL/uL Final  . Hemoglobin 10/13/2015 9.8* 12.0 - 15.0 g/dL Final  . HCT 10/13/2015 29.6* 36.0 - 46.0 % Final  . MCV 10/13/2015 97.7  78.0 - 100.0 fL Final  . MCH 10/13/2015 32.3  26.0 - 34.0 pg Final  . MCHC 10/13/2015 33.1  30.0 - 36.0 g/dL Final  . RDW 10/13/2015 14.4  11.5 - 15.5 % Final  . Platelets 10/13/2015 121* 150 - 400 K/uL Final  . Sodium 10/13/2015 138  135 - 145 mmol/L Final   . Potassium 10/13/2015 3.8  3.5 - 5.1 mmol/L Final  . Chloride 10/13/2015 105  101 - 111 mmol/L Final  . CO2 10/13/2015 29  22 - 32 mmol/L Final  . Glucose, Bld 10/13/2015 136* 65 - 99 mg/dL Final  . BUN 10/13/2015 14  6 - 20 mg/dL Final  . Creatinine, Ser 10/13/2015 0.84  0.44 - 1.00 mg/dL Final  . Calcium 10/13/2015 8.6* 8.9 - 10.3 mg/dL Final  . GFR calc non Af Amer 10/13/2015 >60  >60 mL/min Final  . GFR calc Af Amer 10/13/2015 >60  >60 mL/min Final   Comment: (NOTE) The eGFR has been calculated using the CKD EPI equation. This calculation has not been validated in all clinical situations. eGFR's persistently <60 mL/min signify possible Chronic Kidney Disease.   Georgiann Hahn gap 10/13/2015 4* 5 - 15 Final  Hospital Outpatient Visit on 10/01/2015  Component Date Value Ref Range Status  . aPTT 10/01/2015 33  24 - 37 seconds Final  . WBC 10/01/2015 5.5  4.0 - 10.5 K/uL Final  . RBC 10/01/2015 3.85* 3.87 - 5.11 MIL/uL Final  . Hemoglobin 10/01/2015 12.5  12.0 - 15.0 g/dL Final  . HCT 10/01/2015 38.4  36.0 - 46.0 % Final  . MCV 10/01/2015 99.7  78.0 - 100.0 fL Final  . MCH 10/01/2015 32.5  26.0 - 34.0 pg Final  . MCHC 10/01/2015 32.6  30.0 - 36.0 g/dL Final  . RDW 10/01/2015 14.2  11.5 - 15.5 % Final  . Platelets 10/01/2015  148* 150 - 400 K/uL Final  . Sodium 10/01/2015 143  135 - 145 mmol/L Final  . Potassium 10/01/2015 4.6  3.5 - 5.1 mmol/L Final  . Chloride 10/01/2015 105  101 - 111 mmol/L Final  . CO2 10/01/2015 32  22 - 32 mmol/L Final  . Glucose, Bld 10/01/2015 105* 65 - 99 mg/dL Final  . BUN 10/01/2015 18  6 - 20 mg/dL Final  . Creatinine, Ser 10/01/2015 0.98  0.44 - 1.00 mg/dL Final  . Calcium 10/01/2015 9.8  8.9 - 10.3 mg/dL Final  . Total Protein 10/01/2015 6.9  6.5 - 8.1 g/dL Final  . Albumin 10/01/2015 4.1  3.5 - 5.0 g/dL Final  . AST 10/01/2015 26  15 - 41 U/L Final  . ALT 10/01/2015 15  14 - 54 U/L Final  . Alkaline Phosphatase 10/01/2015 53  38 - 126 U/L Final  .  Total Bilirubin 10/01/2015 0.6  0.3 - 1.2 mg/dL Final  . GFR calc non Af Amer 10/01/2015 58* >60 mL/min Final  . GFR calc Af Amer 10/01/2015 >60  >60 mL/min Final   Comment: (NOTE) The eGFR has been calculated using the CKD EPI equation. This calculation has not been validated in all clinical situations. eGFR's persistently <60 mL/min signify possible Chronic Kidney Disease.   . Anion gap 10/01/2015 6  5 - 15 Final  . Prothrombin Time 10/01/2015 14.3  11.6 - 15.2 seconds Final  . INR 10/01/2015 1.09  0.00 - 1.49 Final  . ABO/RH(D) 10/01/2015 O POS   Final  . Antibody Screen 10/01/2015 NEG   Final  . Sample Expiration 10/01/2015 10/15/2015   Final  . Extend sample reason 10/01/2015 NO TRANSFUSIONS OR PREGNANCY IN THE PAST 3 MONTHS   Final  . Color, Urine 10/01/2015 YELLOW  YELLOW Final  . APPearance 10/01/2015 CLOUDY* CLEAR Final  . Specific Gravity, Urine 10/01/2015 1.020  1.005 - 1.030 Final  . pH 10/01/2015 6.5  5.0 - 8.0 Final  . Glucose, UA 10/01/2015 NEGATIVE  NEGATIVE mg/dL Final  . Hgb urine dipstick 10/01/2015 TRACE* NEGATIVE Final  . Bilirubin Urine 10/01/2015 NEGATIVE  NEGATIVE Final  . Ketones, ur 10/01/2015 NEGATIVE  NEGATIVE mg/dL Final  . Protein, ur 10/01/2015 NEGATIVE  NEGATIVE mg/dL Final  . Urobilinogen, UA 10/01/2015 0.2  0.0 - 1.0 mg/dL Final  . Nitrite 10/01/2015 NEGATIVE  NEGATIVE Final  . Leukocytes, UA 10/01/2015 LARGE* NEGATIVE Final  . MRSA, PCR 10/01/2015 NEGATIVE  NEGATIVE Final  . Staphylococcus aureus 10/01/2015 NEGATIVE  NEGATIVE Final   Comment:        The Xpert SA Assay (FDA approved for NASAL specimens in patients over 62 years of age), is one component of a comprehensive surveillance program.  Test performance has been validated by [facility] for patients greater than or equal to 8 year old. It is not intended to diagnose infection nor to guide or monitor treatment.   . Squamous Epithelial / LPF 10/01/2015 FEW* RARE Final  . WBC, UA  10/01/2015 TOO NUMEROUS TO COUNT  <3 WBC/hpf Final  . RBC / HPF 10/01/2015 0-2  <3 RBC/hpf Final  . Bacteria, UA 10/01/2015 MANY* RARE Final  . ABO/RH(D) 10/01/2015 O POS   Final  Lab on 09/16/2015  Component Date Value Ref Range Status  . WBC 09/16/2015 5.0  4.0 - 10.5 K/uL Final  . RBC 09/16/2015 3.92  3.87 - 5.11 MIL/uL Final  . Hemoglobin 09/16/2015 12.7  12.0 - 15.0 g/dL Final  . HCT 09/16/2015  38.4  36.0 - 46.0 % Final  . MCV 09/16/2015 98.0  78.0 - 100.0 fL Final  . MCH 09/16/2015 32.4  26.0 - 34.0 pg Final  . MCHC 09/16/2015 33.1  30.0 - 36.0 g/dL Final  . RDW 09/16/2015 14.3  11.5 - 15.5 % Final  . Platelets 09/16/2015 136* 150 - 400 K/uL Final  . Neutrophils Relative % 09/16/2015 71   Final  . Neutro Abs 09/16/2015 3.6  1.7 - 7.7 K/uL Final  . Lymphocytes Relative 09/16/2015 21   Final  . Lymphs Abs 09/16/2015 1.0  0.7 - 4.0 K/uL Final  . Monocytes Relative 09/16/2015 6   Final  . Monocytes Absolute 09/16/2015 0.3  0.1 - 1.0 K/uL Final  . Eosinophils Relative 09/16/2015 1   Final  . Eosinophils Absolute 09/16/2015 0.0  0.0 - 0.7 K/uL Final  . Basophils Relative 09/16/2015 1   Final  . Basophils Absolute 09/16/2015 0.0  0.0 - 0.1 K/uL Final  . Sodium 09/16/2015 141  135 - 145 mmol/L Final  . Potassium 09/16/2015 4.1  3.5 - 5.1 mmol/L Final  . Chloride 09/16/2015 104  101 - 111 mmol/L Final  . CO2 09/16/2015 31  22 - 32 mmol/L Final  . Glucose, Bld 09/16/2015 101* 65 - 99 mg/dL Final  . BUN 09/16/2015 13  6 - 20 mg/dL Final  . Creatinine, Ser 09/16/2015 0.92  0.44 - 1.00 mg/dL Final  . Calcium 09/16/2015 9.9  8.9 - 10.3 mg/dL Final  . Total Protein 09/16/2015 7.4  6.5 - 8.1 g/dL Final  . Albumin 09/16/2015 4.3  3.5 - 5.0 g/dL Final  . AST 09/16/2015 28  15 - 41 U/L Final  . ALT 09/16/2015 15  14 - 54 U/L Final  . Alkaline Phosphatase 09/16/2015 52  38 - 126 U/L Final  . Total Bilirubin 09/16/2015 0.7  0.3 - 1.2 mg/dL Final  . GFR calc non Af Amer 09/16/2015 >60  >60  mL/min Final  . GFR calc Af Amer 09/16/2015 >60  >60 mL/min Final   Comment: (NOTE) The eGFR has been calculated using the CKD EPI equation. This calculation has not been validated in all clinical situations. eGFR's persistently <60 mL/min signify possible Chronic Kidney Disease.   . Anion gap 09/16/2015 6  5 - 15 Final     X-Rays:No results found.  EKG:No orders found for this or any previous visit.   Hospital Course: Melissa Villanueva is a 69 y.o. who was admitted to [facility]. They were brought to the operating room on 10/12/2015 and underwent Procedure(s): RIGHT TOTAL KNEE ARTHROPLASTY.  Patient tolerated the procedure well and was later transferred to the recovery room and then to the orthopaedic floor for postoperative care.  They were given PO and IV analgesics for pain control following their surgery.  They were given 24 hours of postoperative antibiotics of  Anti-infectives    Start     Dose/Rate Route Frequency Ordered Stop   10/12/15 2000  ceFAZolin (ANCEF) IVPB 2 g/50 mL premix     2 g 100 mL/hr over 30 Minutes Intravenous Every 6 hours 10/12/15 1556 10/13/15 0216   10/12/15 0922  ceFAZolin (ANCEF) IVPB 2 g/50 mL premix     2 g 100 mL/hr over 30 Minutes Intravenous On call to O.R. 10/12/15 1121 10/12/15 1228     and started on DVT prophylaxis in the form of Xarelto.   PT and OT were ordered for total joint protocol.  Discharge planning consulted  to help with postop disposition and equipment needs.  Patient had a decent night on the evening of surgery.  They started to get up OOB with therapy on day one. Hemovac drain was pulled without difficulty.  Continued to work with therapy into day two.  Dressing was changed on day two and the incision was healing well. Patient was seen in rounds and was ready to go home.  Discharge home with home health Diet - Regular diet Follow up - in 2 weeks Activity - WBAT Disposition - Home Condition Upon Discharge - Good D/C Meds  - See DC Summary DVT Prophylaxis - Xarelto  Discharge Instructions    Call MD / Call 911    Complete by:  As directed   If you experience chest pain or shortness of breath, CALL 911 and be transported to the hospital emergency room.  If you develope a fever above 101 F, pus (white drainage) or increased drainage or redness at the wound, or calf pain, call your surgeon's office.     Change dressing    Complete by:  As directed   Change dressing daily with sterile 4 x 4 inch gauze dressing and apply TED hose. Do not submerge the incision under water.     Constipation Prevention    Complete by:  As directed   Drink plenty of fluids.  Prune juice may be helpful.  You may use a stool softener, such as Colace (over the counter) 100 mg twice a day.  Use MiraLax (over the counter) for constipation as needed.     Diet general    Complete by:  As directed      Discharge instructions    Complete by:  As directed   Pick up stool softner and laxative for home use following surgery while on pain medications. Do not submerge incision under water. Please use good hand washing techniques while changing dressing each day. May shower starting three days after surgery. Please use a clean towel to pat the incision dry following showers. Continue to use ice for pain and swelling after surgery. Do not use any lotions or creams on the incision until instructed by your surgeon.  Take Xarelto for two and a half more weeks, then discontinue Xarelto. Once the patient has completed the blood thinner regimen, then take a Baby 81 mg Aspirin daily for three more weeks.  Postoperative Constipation Protocol  Constipation - defined medically as fewer than three stools per week and severe constipation as less than one stool per week.  One of the most common issues patients have following surgery is constipation.  Even if you have a regular bowel pattern at home, your normal regimen is likely to be disrupted due to  multiple reasons following surgery.  Combination of anesthesia, postoperative narcotics, change in appetite and fluid intake all can affect your bowels.  In order to avoid complications following surgery, here are some recommendations in order to help you during your recovery period.  Colace (docusate) - Pick up an over-the-counter form of Colace or another stool softener and take twice a day as long as you are requiring postoperative pain medications.  Take with a full glass of water daily.  If you experience loose stools or diarrhea, hold the colace until you stool forms back up.  If your symptoms do not get better within 1 week or if they get worse, check with your doctor.  Dulcolax (bisacodyl) - Pick up over-the-counter and take as directed by the product  packaging as needed to assist with the movement of your bowels.  Take with a full glass of water.  Use this product as needed if not relieved by Colace only.   MiraLax (polyethylene glycol) - Pick up over-the-counter to have on hand.  MiraLax is a solution that will increase the amount of water in your bowels to assist with bowel movements.  Take as directed and can mix with a glass of water, juice, soda, coffee, or tea.  Take if you go more than two days without a movement. Do not use MiraLax more than once per day. Call your doctor if you are still constipated or irregular after using this medication for 7 days in a row.  If you continue to have problems with postoperative constipation, please contact the office for further assistance and recommendations.  If you experience "the worst abdominal pain ever" or develop nausea or vomiting, please contact the office immediatly for further recommendations for treatment.     Do not put a pillow under the knee. Place it under the heel.    Complete by:  As directed      Do not sit on low chairs, stoools or toilet seats, as it may be difficult to get up from low surfaces    Complete by:  As directed       Driving restrictions    Complete by:  As directed   No driving until released by the physician.     Increase activity slowly as tolerated    Complete by:  As directed      Lifting restrictions    Complete by:  As directed   No lifting until released by the physician.     Patient may shower    Complete by:  As directed   You may shower without a dressing once there is no drainage.  Do not wash over the wound.  If drainage remains, do not shower until drainage stops.     TED hose    Complete by:  As directed   Use stockings (TED hose) for 3 weeks on both leg(s).  You may remove them at night for sleeping.     Weight bearing as tolerated    Complete by:  As directed   Laterality:  right  Extremity:  Lower            Medication List    STOP taking these medications        CALCIUM 600/VITAMIN D 600-400 MG-UNIT tablet  Generic drug:  Calcium Carbonate-Vitamin D     hydroxychloroquine 200 MG tablet  Commonly known as:  PLAQUENIL     meloxicam 15 MG tablet  Commonly known as:  MOBIC      TAKE these medications        famotidine 40 MG tablet  Commonly known as:  PEPCID  Take 40 mg by mouth daily.     folic acid 010 MCG tablet  Commonly known as:  FOLVITE  Take 400 mcg by mouth daily.     methocarbamol 500 MG tablet  Commonly known as:  ROBAXIN  Take 1 tablet (500 mg total) by mouth every 6 (six) hours as needed for muscle spasms.     oxyCODONE 5 MG immediate release tablet  Commonly known as:  Oxy IR/ROXICODONE  Take 1-2 tablets (5-10 mg total) by mouth every 3 (three) hours as needed for breakthrough pain.     PROBIOTIC PO  Take 1 capsule by mouth daily.  rivaroxaban 10 MG Tabs tablet  Commonly known as:  XARELTO  Take 1 tablet (10 mg total) by mouth daily with breakfast. Take Xarelto for two and a half more weeks, then discontinue Xarelto. Once the patient has completed the blood thinner regimen, then take a Baby 81 mg Aspirin daily for three more weeks.      solifenacin 5 MG tablet  Commonly known as:  VESICARE  Take 5 mg by mouth daily.     STOOL SOFTENER PO  Take 1 capsule by mouth daily.     traMADol 50 MG tablet  Commonly known as:  ULTRAM  Take 1-2 tablets (50-100 mg total) by mouth every 6 (six) hours as needed (mild pain).     TYLENOL ARTHRITIS PAIN PO  Take 1 capsule by mouth every 6 (six) hours as needed (for pain).           Follow-up Information    Follow up with [facility].   Why:  home health physical therapy   Contact information:   [address] [place] [address] [phone]       Follow up with [facility].   Why:  rolling walker    Contact information:   [address] [place] [address] [phone]       Follow up with Gearlean Alf, MD. Schedule an appointment as soon as possible for a visit on 10/27/2015.   Specialty:  Orthopedic Surgery   Why:  Call office at [phone] to setup appointment on Tuesday 10/27/2015 with Dr. Wynelle Link.   Contact information:   [address] [place] [address] [phone]       Signed: Arlee Muslim, PA-C Orthopaedic Surgery 10/13/2015, 9:44 PM

## 2015-10-13 NOTE — Evaluation (Addendum)
Physical Therapy Evaluation Patient Details Name: Melissa Villanueva MRN: [id number] DOB: [birth year] Today's Date: 10/13/2015   History of Present Illness  Pt is a 69 year old female s/p R TKA  Clinical Impression  Pt is s/p R TKA resulting in the deficits listed below (see PT Problem List).  Pt will benefit from skilled PT to increase their independence and safety with mobility to allow discharge to the venue listed below.  Pt mobilizing well POD#1 and plans to d/c home likely tomorrow with assist from daughter.     Follow Up Recommendations Home health PT;Supervision for mobility/OOB    Equipment Recommendations  None recommended by PT    Recommendations for Other Services       Precautions / Restrictions Precautions Precautions: Fall;Knee Required Braces or Orthoses: Knee Immobilizer - Right Knee Immobilizer - Right: Discontinue once straight leg raise with < 10 degree lag Restrictions Other Position/Activity Restrictions: WBAT      Mobility  Bed Mobility Overal bed mobility: Needs Assistance Bed Mobility: Supine to Sit     Supine to sit: Supervision;HOB elevated     General bed mobility comments: verbal cues for self assist  Transfers Overall transfer level: Needs assistance Equipment used: Rolling walker (2 wheeled) Transfers: Sit to/from Stand Sit to Stand: Min assist         General transfer comment: verbal cues for safe technique, assist to rise and steady as well as control descent  Ambulation/Gait Ambulation/Gait assistance: Min assist Ambulation Distance (Feet): 120 Feet Assistive device: Rolling walker (2 wheeled) Gait Pattern/deviations: Step-to pattern;Step-through pattern;Antalgic     General Gait Details: verbal cues for sequence, step length, RW positioning, able to progres to step through pattern  Stairs            Wheelchair Mobility    Modified Rankin (Stroke Patients Only)       Balance                                             Pertinent Vitals/Pain Pain Assessment: 0-10 Pain Score: 3  Pain Location: R knee Pain Descriptors / Indicators: Sore Pain Intervention(s): Monitored during session;Limited activity within patient's tolerance;Premedicated before session;Repositioned;Ice applied    Home Living Family/patient expects to be discharged to:: Private residence Living Arrangements: Alone Available Help at Discharge: Family (daughter staying x 2 weeks) Type of Home: House Home Access: Stairs to enter Entrance Stairs-Rails: None Entrance Stairs-Number of Steps: 1 Home Layout: One level Home Equipment: Environmental consultant - 2 wheels      Prior Function Level of Independence: Independent with assistive device(s)               Hand Dominance        Extremity/Trunk Assessment   Upper Extremity Assessment: RUE deficits/detail RUE Deficits / Details: right shoulder painful after a fall 5 weeks ago         Lower Extremity Assessment: RLE deficits/detail RLE Deficits / Details: good quad contraction, R knee AAROM -3-50*        Communication   Communication: No difficulties  Cognition Arousal/Alertness: Awake/alert Behavior During Therapy: WFL for tasks assessed/performed Overall Cognitive Status: Within Functional Limits for tasks assessed                      General Comments      Exercises Total Joint Exercises Ankle Circles/Pumps: AROM;Both;10 reps  Quad Sets: AROM;Both;10 reps Short Arc Quad: AROM;Right;10 reps Heel Slides: AAROM;Right;10 reps Hip ABduction/ADduction: AAROM;Right;10 reps Straight Leg Raises: AAROM;Right;10 reps      Assessment/Plan    PT Assessment Patient needs continued PT services  PT Diagnosis Difficulty walking;Acute pain   PT Problem List Decreased strength;Decreased range of motion;Decreased mobility;Decreased knowledge of use of DME;Pain;Decreased knowledge of precautions  PT Treatment Interventions Functional mobility  training;Stair training;Gait training;DME instruction;Patient/family education;Therapeutic activities;Therapeutic exercise   PT Goals (Current goals can be found in the Care Plan section) Acute Rehab PT Goals PT Goal Formulation: With patient Time For Goal Achievement: 10/17/15 Potential to Achieve Goals: Good    Frequency 7X/week   Barriers to discharge        Co-evaluation               End of Session Equipment Utilized During Treatment: Gait belt;Right knee immobilizer Activity Tolerance: Patient tolerated treatment well Patient left: in chair;with call bell/phone within reach;with chair alarm set;with family/visitor present Nurse Communication: Mobility status         Time: 1030-1055 PT Time Calculation (min) (ACUTE ONLY): 25 min   Charges:   PT Evaluation $Initial PT Evaluation Tier I: 1 Procedure PT Treatments $Therapeutic Exercise: 8-22 mins   PT G Codes:        Gaetan Spieker,KATHrine E 10/13/2015, 1:00 PM Carmelia Bake, PT, DPT 10/13/2015 Pager: [phone]

## 2015-10-13 NOTE — Discharge Instructions (Addendum)
° °Dr. Frank Aluisio °Total Joint Specialist °[facility] °[address] °[place], [place] [address] °[phone] ° °TOTAL KNEE REPLACEMENT POSTOPERATIVE DIRECTIONS ° °Knee Rehabilitation, Guidelines Following Surgery  °Results after knee surgery are often greatly improved when you follow the exercise, range of motion and muscle strengthening exercises prescribed by your doctor. Safety measures are also important to protect the knee from further injury. Any time any of these exercises cause you to have increased pain or swelling in your knee joint, decrease the amount until you are comfortable again and slowly increase them. If you have problems or questions, call your caregiver or physical therapist for advice.  ° °HOME CARE INSTRUCTIONS  °Remove items at home which could result in a fall. This includes throw rugs or furniture in walking pathways.  °· ICE to the affected knee every three hours for 30 minutes at a time and then as needed for pain and swelling.  Continue to use ice on the knee for pain and swelling from surgery. You may notice swelling that will progress down to the foot and ankle.  This is normal after surgery.  Elevate the leg when you are not up walking on it.   °· Continue to use the breathing machine which will help keep your temperature down.  It is common for your temperature to cycle up and down following surgery, especially at night when you are not up moving around and exerting yourself.  The breathing machine keeps your lungs expanded and your temperature down. °· Do not place pillow under knee, focus on keeping the knee straight while resting ° °DIET °You may resume your previous home diet once your are discharged from the hospital. ° °DRESSING / WOUND CARE / SHOWERING °You may shower 3 days after surgery, but keep the wounds dry during showering.  You may use an occlusive plastic wrap (Press'n Seal for example), NO SOAKING/SUBMERGING IN THE BATHTUB.  If the  bandage gets wet, change with a clean dry gauze.  If the incision gets wet, pat the wound dry with a clean towel. °You may start showering once you are discharged home but do not submerge the incision under water. Just pat the incision dry and apply a dry gauze dressing on daily. °Change the surgical dressing daily and reapply a dry dressing each time. ° °ACTIVITY °Walk with your walker as instructed. °Use walker as long as suggested by your caregivers. °Avoid periods of inactivity such as sitting longer than an hour when not asleep. This helps prevent blood clots.  °You may resume a sexual relationship in one month or when given the OK by your doctor.  °You may return to work once you are cleared by your doctor.  °Do not drive a car for 6 weeks or until released by you surgeon.  °Do not drive while taking narcotics. ° °WEIGHT BEARING °Weight bearing as tolerated with assist device (walker, cane, etc) as directed, use it as long as suggested by your surgeon or therapist, typically at least 4-6 weeks. ° °POSTOPERATIVE CONSTIPATION PROTOCOL °Constipation - defined medically as fewer than three stools per week and severe constipation as less than one stool per week. ° °One of the most common issues patients have following surgery is constipation.  Even if you have a regular bowel pattern at home, your normal regimen is likely to be disrupted due to multiple reasons following surgery.  Combination of anesthesia, postoperative narcotics, change in appetite and fluid intake all can affect your bowels.    In order to avoid complications following surgery, here are some recommendations in order to help you during your recovery period. ° °Colace (docusate) - Pick up an over-the-counter form of Colace or another stool softener and take twice a day as long as you are requiring postoperative pain medications.  Take with a full glass of water daily.  If you experience loose stools or diarrhea, hold the colace until you stool forms  back up.  If your symptoms do not get better within 1 week or if they get worse, check with your doctor. ° °Dulcolax (bisacodyl) - Pick up over-the-counter and take as directed by the product packaging as needed to assist with the movement of your bowels.  Take with a full glass of water.  Use this product as needed if not relieved by Colace only.  ° °MiraLax (polyethylene glycol) - Pick up over-the-counter to have on hand.  MiraLax is a solution that will increase the amount of water in your bowels to assist with bowel movements.  Take as directed and can mix with a glass of water, juice, soda, coffee, or tea.  Take if you go more than two days without a movement. °Do not use MiraLax more than once per day. Call your doctor if you are still constipated or irregular after using this medication for 7 days in a row. ° °If you continue to have problems with postoperative constipation, please contact the office for further assistance and recommendations.  If you experience "the worst abdominal pain ever" or develop nausea or vomiting, please contact the office immediatly for further recommendations for treatment. ° °ITCHING ° If you experience itching with your medications, try taking only a single pain pill, or even half a pain pill at a time.  You can also use Benadryl over the counter for itching or also to help with sleep.  ° °TED HOSE STOCKINGS °Wear the elastic stockings on both legs for three weeks following surgery during the day but you may remove then at night for sleeping. ° °MEDICATIONS °See your medication summary on the “After Visit Summary” that the nursing staff will review with you prior to discharge.  You may have some home medications which will be placed on hold until you complete the course of blood thinner medication.  It is important for you to complete the blood thinner medication as prescribed by your surgeon.  Continue your approved medications as instructed at time of  discharge. ° °PRECAUTIONS °If you experience chest pain or shortness of breath - call 911 immediately for transfer to the hospital emergency department.  °If you develop a fever greater that 101 F, purulent drainage from wound, increased redness or drainage from wound, foul odor from the wound/dressing, or calf pain - CONTACT YOUR SURGEON.   °                                                °FOLLOW-UP APPOINTMENTS °Make sure you keep all of your appointments after your operation with your surgeon and caregivers. You should call the office at the above phone number and make an appointment for approximately two weeks after the date of your surgery or on the date instructed by your surgeon outlined in the "After Visit Summary". ° ° °RANGE OF MOTION AND STRENGTHENING EXERCISES  °Rehabilitation of the knee is important following a knee injury or   an operation. After just a few days of immobilization, the muscles of the thigh which control the knee become weakened and shrink (atrophy). Knee exercises are designed to build up the tone and strength of the thigh muscles and to improve knee motion. Often times heat used for twenty to thirty minutes before working out will loosen up your tissues and help with improving the range of motion but do not use heat for the first two weeks following surgery. These exercises can be done on a training (exercise) mat, on the floor, on a table or on a bed. Use what ever works the best and is most comfortable for you Knee exercises include:  °Leg Lifts - While your knee is still immobilized in a splint or cast, you can do straight leg raises. Lift the leg to 60 degrees, hold for 3 sec, and slowly lower the leg. Repeat 10-20 times 2-3 times daily. Perform this exercise against resistance later as your knee gets better.  °Quad and Hamstring Sets - Tighten up the muscle on the front of the thigh (Quad) and hold for 5-10 sec. Repeat this 10-20 times hourly. Hamstring sets are done by pushing the  foot backward against an object and holding for 5-10 sec. Repeat as with quad sets.  °· Leg Slides: Lying on your back, slowly slide your foot toward your buttocks, bending your knee up off the floor (only go as far as is comfortable). Then slowly slide your foot back down until your leg is flat on the floor again. °· Angel Wings: Lying on your back spread your legs to the side as far apart as you can without causing discomfort.  °A rehabilitation program following serious knee injuries can speed recovery and prevent re-injury in the future due to weakened muscles. Contact your doctor or a physical therapist for more information on knee rehabilitation.  ° °IF YOU ARE TRANSFERRED TO A SKILLED REHAB FACILITY °If the patient is transferred to a skilled rehab facility following release from the hospital, a list of the current medications will be sent to the facility for the patient to continue.  When discharged from the skilled rehab facility, please have the facility set up the patient's Home Health Physical Therapy prior to being released. Also, the skilled facility will be responsible for providing the patient with their medications at time of release from the facility to include their pain medication, the muscle relaxants, and their blood thinner medication. If the patient is still at the rehab facility at time of the two week follow up appointment, the skilled rehab facility will also need to assist the patient in arranging follow up appointment in our office and any transportation needs. ° °MAKE SURE YOU:  °Understand these instructions.  °Get help right away if you are not doing well or get worse.  ° ° °Pick up stool softner and laxative for home use following surgery while on pain medications. °Do not submerge incision under water. °Please use good hand washing techniques while changing dressing each day. °May shower starting three days after surgery. °Please use a clean towel to pat the incision dry following  showers. °Continue to use ice for pain and swelling after surgery. °Do not use any lotions or creams on the incision until instructed by your surgeon. ° °Take Xarelto for two and a half more weeks, then discontinue Xarelto. °Once the patient has completed the blood thinner regimen, then take a Baby 81 mg Aspirin daily for three more weeks. ° ° °Information   on my medicine - XARELTO® (Rivaroxaban) ° °This medication education was reviewed with me or my healthcare representative as part of my discharge preparation.  The pharmacist that spoke with me during my hospital stay was:  Gadhia, Jigna M, RPH ° °Why was Xarelto® prescribed for you? °Xarelto® was prescribed for you to reduce the risk of blood clots forming after orthopedic surgery. The medical term for these abnormal blood clots is venous thromboembolism (VTE). ° °What do you need to know about xarelto® ? °Take your Xarelto® ONCE DAILY at the same time every day. °You may take it either with or without food. ° °If you have difficulty swallowing the tablet whole, you may crush it and mix in applesauce just prior to taking your dose. ° °Take Xarelto® exactly as prescribed by your doctor and DO NOT stop taking Xarelto® without talking to the doctor who prescribed the medication.  Stopping without other VTE prevention medication to take the place of Xarelto® may increase your risk of developing a clot. ° °After discharge, you should have regular check-up appointments with your healthcare provider that is prescribing your Xarelto®.   ° °What do you do if you miss a dose? °If you miss a dose, take it as soon as you remember on the same day then continue your regularly scheduled once daily regimen the next day. Do not take two doses of Xarelto® on the same day.  ° °Important Safety Information °A possible side effect of Xarelto® is bleeding. You should call your healthcare provider right away if you experience any of the following: °? Bleeding from an injury or your  nose that does not stop. °? Unusual colored urine (red or dark brown) or unusual colored stools (red or black). °? Unusual bruising for unknown reasons. °? A serious fall or if you hit your head (even if there is no bleeding). ° °Some medicines may interact with Xarelto® and might increase your risk of bleeding while on Xarelto®. To help avoid this, consult your healthcare provider or pharmacist prior to using any new prescription or non-prescription medications, including herbals, vitamins, non-steroidal anti-inflammatory drugs (NSAIDs) and supplements. ° °This website has more information on Xarelto®: www.xarelto.com. ° ° °

## 2015-10-13 NOTE — Progress Notes (Signed)
Utilization review completed.  

## 2015-10-13 NOTE — Care Management Note (Signed)
Case Management Note  Patient Details  Name: Ashayla Subia MRN: [id number] Date of Birth: 03/19/1946  Subjective/Objective:                  RIGHT TOTAL KNEE ARTHROPLASTY (Right) Action/Plan: Discharge planning Expected Discharge Date:                  Expected Discharge Plan:  Reserve  In-House Referral:     Discharge planning Services  CM Consult  Post Acute Care Choice:  Home Health Choice offered to:  Patient  DME Arranged:  3-N-1 DME Agency:  [place]:  PT [facility] Agency:  [place]  Status of Service:  Completed, signed off  Medicare Important Message Given:    Date Medicare IM Given:    Medicare IM give by:    Date Additional Medicare IM Given:    Additional Medicare Important Message give by:     If discussed at [place] of Stay Meetings, dates discussed:    Additional Comments: CM met with pt in room to offer choice of home health agency.  Pt chooses [facility] to render HHPT.  Referral emailed to [employer], Tim.  Cm called [facility] DME rep, Lecretia to please deliver the 3n1 to room prior to discharge.  Pt is going home with daughter and states she has a rolling walker at home.  No other CM needs were communicated. Dellie Catholic, RN 10/13/2015, 12:16 PM

## 2015-10-13 NOTE — Progress Notes (Signed)
Physical Therapy Treatment Note    10/13/15 1457  PT Visit Information  Last PT Received On 10/13/15  Assistance Needed +1  History of Present Illness Pt is a 69 year old female s/p R TKA  PT Time Calculation  PT Start Time (ACUTE ONLY) 1411  PT Stop Time (ACUTE ONLY) 1424  PT Time Calculation (min) (ACUTE ONLY) 13 min  Subjective Data  Subjective Pt ambulated in hallway again and then assisted back to bed.  Precautions  Precautions Fall;Knee  Required Braces or Orthoses Knee Immobilizer - Right  Knee Immobilizer - Right Discontinue once straight leg raise with < 10 degree lag  Restrictions  Other Position/Activity Restrictions WBAT  Pain Assessment  Pain Assessment 0-10  Pain Score 3  Pain Location R knee  Pain Descriptors / Indicators Tightness;Sore  Pain Intervention(s) Limited activity within patient's tolerance;Monitored during session;Ice applied  Cognition  Arousal/Alertness Awake/alert  Behavior During Therapy WFL for tasks assessed/performed  Overall Cognitive Status Within Functional Limits for tasks assessed  Bed Mobility  Overal bed mobility Needs Assistance  Bed Mobility Sit to Supine  Sit to supine Min guard  General bed mobility comments min/guard due to difficulty raising LEs onto bed  Transfers  Overall transfer level Needs assistance  Equipment used Rolling walker (2 wheeled)  Transfers Sit to/from Stand  Sit to Stand Min guard  General transfer comment verbal cues for safe technique  Ambulation/Gait  Ambulation/Gait assistance Min guard  Ambulation Distance (Feet) 160 Feet  Assistive device Rolling walker (2 wheeled)  Gait Pattern/deviations Step-through pattern;Antalgic;Decreased stance time - right;Decreased step length - left  General Gait Details verbal cues for step length, RW positioning  PT - End of Session  Equipment Utilized During Treatment Right knee immobilizer  Activity Tolerance Patient tolerated treatment well  Patient left in  bed;with call bell/phone within reach;with bed alarm set  PT - Assessment/Plan  PT Plan Current plan remains appropriate  PT Frequency (ACUTE ONLY) 7X/week  Follow Up Recommendations Home health PT;Supervision for mobility/OOB  PT equipment None recommended by PT  PT Goal Progression  Progress towards PT goals Progressing toward goals  PT General Charges  $$ ACUTE PT VISIT 1 Procedure  PT Treatments  $Gait Training 8-22 mins   Carmelia Bake, PT, DPT 10/13/2015 Pager: [phone]

## 2015-10-14 LAB — BASIC METABOLIC PANEL
ANION GAP: 6 (ref 5–15)
BUN: 18 mg/dL (ref 6–20)
CHLORIDE: 102 mmol/L (ref 101–111)
CO2: 30 mmol/L (ref 22–32)
Calcium: 8.7 mg/dL — ABNORMAL LOW (ref 8.9–10.3)
Creatinine, Ser: 0.9 mg/dL (ref 0.44–1.00)
GFR calc non Af Amer: >60 mL/min (ref >60)
Glucose, Bld: 125 mg/dL — ABNORMAL HIGH (ref 65–99)
POTASSIUM: 3.8 mmol/L (ref 3.5–5.1)
Sodium: 138 mmol/L (ref 135–145)

## 2015-10-14 LAB — CBC
HEMATOCRIT: 26.4 % — AB (ref 36.0–46.0)
HEMOGLOBIN: 8.7 g/dL — AB (ref 12.0–15.0)
MCH: 31.9 pg (ref 26.0–34.0)
MCHC: 33 g/dL (ref 30.0–36.0)
MCV: 96.7 fL (ref 78.0–100.0)
Platelets: 117 10*3/uL — ABNORMAL LOW (ref 150–400)
RBC: 2.73 MIL/uL — ABNORMAL LOW (ref 3.87–5.11)
RDW: 14.6 % (ref 11.5–15.5)
WBC: 11 10*3/uL — AB (ref 4.0–10.5)

## 2015-10-14 MED ORDER — POLYSACCHARIDE IRON COMPLEX 150 MG PO CAPS: 150.0000 mg | ORAL_CAPSULE | Freq: Every day | ORAL | Status: DC

## 2015-10-14 MED ORDER — POLYSACCHARIDE IRON COMPLEX 150 MG PO CAPS
150.0000 mg | ORAL_CAPSULE | Freq: Every day | ORAL | Status: DC
  Administered 2015-10-14: 150 mg via ORAL
  Filled 2015-10-14: qty 1

## 2015-10-14 NOTE — Progress Notes (Signed)
Physical Therapy Treatment Patient Details Name: Melissa Villanueva MRN: [id number] DOB: Nov 13, 1946 Today's Date: 10/14/2015    History of Present Illness Pt is a 69 year old female s/p R TKA    PT Comments    Patient is limited by nausea and dizziness. Will check back later  Follow Up Recommendations  Home health PT;Supervision for mobility/OOB     Equipment Recommendations  None recommended by PT    Recommendations for Other Services       Precautions / Restrictions Precautions Precautions: Fall;Knee Required Braces or Orthoses: Knee Immobilizer - Right Knee Immobilizer - Right: Discontinue once straight leg raise with < 10 degree lag    Mobility  Bed Mobility   Bed Mobility: Supine to Sit     Supine to sit: Supervision;HOB elevated     General bed mobility comments: cues for technique, patient manages  R leg  Transfers Overall transfer level: Needs assistance Equipment used: Rolling walker (2 wheeled) Transfers: Sit to/from Stand Sit to Stand: Min guard         General transfer comment: verbal cues for safe technique  Ambulation/Gait Ambulation/Gait assistance: Min guard Ambulation Distance (Feet): 10 Feet (x 2) Assistive device: Rolling walker (2 wheeled) Gait Pattern/deviations: Step-through pattern;Decreased step length - right     General Gait Details: limited by dizziness and nausea. RN aware. BP after first walk 131/50, after reat and sitting upright 126/50, after second walk 123/52.    Stairs Stairs: Yes Stairs assistance: Min assist Stair Management: Step to pattern;Backwards;Forwards;With walker Number of Stairs: 1 General stair comments: daughter present to  assist, practiced forward and  backward  Wheelchair Mobility    Modified Rankin (Stroke Patients Only)       Balance                                    Cognition Arousal/Alertness: Awake/alert                          Exercises Total Joint  Exercises Ankle Circles/Pumps: AROM;Both;10 reps Quad Sets: AROM;Both;10 reps Towel Squeeze: Both;10 reps Short Arc Quad: AAROM;Right;10 reps Heel Slides: AAROM;Right;10 reps Hip ABduction/ADduction: AAROM;Right;10 reps Straight Leg Raises: AAROM;Right;10 reps Goniometric ROM: 10-50 R knee flexion    General Comments        Pertinent Vitals/Pain Pain Score: 2  Pain Location: R knee Pain Descriptors / Indicators: Tightness;Sore Pain Intervention(s): Premedicated before session;Repositioned    Home Living                      Prior Function            PT Goals (current goals can now be found in the care plan section) Progress towards PT goals: Progressing toward goals    Frequency       PT Plan Current plan remains appropriate    Co-evaluation             End of Session Equipment Utilized During Treatment: Right knee immobilizer Activity Tolerance: Treatment limited secondary to medical complications (Comment) (nausea and  dizziness) Patient left: in chair;with call bell/phone within reach;with family/visitor present     Time: 1047-1130 PT Time Calculation (min) (ACUTE ONLY): 43 min  Charges:  $Gait Training: 8-22 mins $Therapeutic Exercise: 8-22 mins $Self Care/Home Management: 8-22  G Codes:      Claretha Cooper 10/14/2015, 11:47 AM Tresa Endo PT [phone]

## 2015-10-14 NOTE — Progress Notes (Signed)
   Subjective: 2 Days Post-Op Procedure(s) (LRB): RIGHT TOTAL KNEE ARTHROPLASTY (Right) Patient reports pain as mild.   Patient seen in rounds with Dr. Wynelle Link. Patient is well, and has had no acute complaints or problems Patient is ready to go home  Objective: Vital signs in last 24 hours: Temp:  [97.8 F (36.6 C)-98 F (36.7 C)] 98 F (36.7 C) (11/16 0428) Pulse Rate:  [60-68] 68 (11/16 0428) Resp:  [16-18] 16 (11/16 0428) BP: (112-130)/(48-56) 130/56 mmHg (11/16 0428) SpO2:  [96 %-98 %] 98 % (11/16 0428)  Intake/Output from previous day:  Intake/Output Summary (Last 24 hours) at 10/14/15 1018 Last data filed at 10/14/15 0900  Gross per 24 hour  Intake 1116.17 ml  Output    900 ml  Net 216.17 ml    Intake/Output this shift: Total I/O In: 240 [P.O.:240] Out: -   Labs:  Recent Labs  10/13/15 0515 10/14/15 0445  HGB 9.8* 8.7*    Recent Labs  10/13/15 0515 10/14/15 0445  WBC 10.6* 11.0*  RBC 3.03* 2.73*  HCT 29.6* 26.4*  PLT 121* 117*    Recent Labs  10/13/15 0515 10/14/15 0445  NA 138 138  K 3.8 3.8  CL 105 102  CO2 29 30  BUN 14 18  CREATININE 0.84 0.90  GLUCOSE 136* 125*  CALCIUM 8.6* 8.7*   No results for input(s): LABPT, INR in the last 72 hours.  EXAM: General - Patient is Alert and Appropriate Extremity - Neurovascular intact Sensation intact distally Incision - clean, dry Motor Function - intact, moving foot and toes well on exam.   Assessment/Plan: 2 Days Post-Op Procedure(s) (LRB): RIGHT TOTAL KNEE ARTHROPLASTY (Right) Procedure(s) (LRB): RIGHT TOTAL KNEE ARTHROPLASTY (Right) Past Medical History  Diagnosis Date  . Arthritis   . GERD (gastroesophageal reflux disease)   . Pancytopenia ([place]) 2016  . PONV (postoperative nausea and vomiting)   . Stress incontinence   . Headache    Principal Problem:   OA (osteoarthritis) of knee  Estimated body mass index is 38.76 kg/(m^2) as calculated from the following:   Height as of  this encounter: 5\' 1"  (1.549 m).   Weight as of this encounter: 93 kg (205 lb 0.4 oz). Up with therapy Discharge home with home health Diet - Regular diet Follow up - in 2 weeks Activity - WBAT Disposition - Home Condition Upon Discharge - Good D/C Meds - See DC Summary DVT Prophylaxis - Xarelto  Arlee Muslim, PA-C Orthopaedic Surgery 10/14/2015, 10:18 AM

## 2015-10-15 DIAGNOSIS — D61818 Other pancytopenia: Secondary | ICD-10-CM | POA: Diagnosis not present

## 2015-10-15 DIAGNOSIS — I5030 Unspecified diastolic (congestive) heart failure: Secondary | ICD-10-CM | POA: Diagnosis not present

## 2015-10-15 DIAGNOSIS — M1611 Unilateral primary osteoarthritis, right hip: Secondary | ICD-10-CM | POA: Diagnosis not present

## 2015-10-15 DIAGNOSIS — M069 Rheumatoid arthritis, unspecified: Secondary | ICD-10-CM | POA: Diagnosis not present

## 2015-10-15 DIAGNOSIS — N189 Chronic kidney disease, unspecified: Secondary | ICD-10-CM | POA: Diagnosis not present

## 2015-10-15 DIAGNOSIS — Z471 Aftercare following joint replacement surgery: Secondary | ICD-10-CM | POA: Diagnosis not present

## 2015-10-18 DIAGNOSIS — N189 Chronic kidney disease, unspecified: Secondary | ICD-10-CM | POA: Diagnosis not present

## 2015-10-18 DIAGNOSIS — Z471 Aftercare following joint replacement surgery: Secondary | ICD-10-CM | POA: Diagnosis not present

## 2015-10-18 DIAGNOSIS — D61818 Other pancytopenia: Secondary | ICD-10-CM | POA: Diagnosis not present

## 2015-10-18 DIAGNOSIS — M1611 Unilateral primary osteoarthritis, right hip: Secondary | ICD-10-CM | POA: Diagnosis not present

## 2015-10-18 DIAGNOSIS — M069 Rheumatoid arthritis, unspecified: Secondary | ICD-10-CM | POA: Diagnosis not present

## 2015-10-18 DIAGNOSIS — I5030 Unspecified diastolic (congestive) heart failure: Secondary | ICD-10-CM | POA: Diagnosis not present

## 2015-10-20 DIAGNOSIS — N189 Chronic kidney disease, unspecified: Secondary | ICD-10-CM | POA: Diagnosis not present

## 2015-10-20 DIAGNOSIS — Z96651 Presence of right artificial knee joint: Secondary | ICD-10-CM | POA: Diagnosis not present

## 2015-10-20 DIAGNOSIS — D61818 Other pancytopenia: Secondary | ICD-10-CM | POA: Diagnosis not present

## 2015-10-20 DIAGNOSIS — M069 Rheumatoid arthritis, unspecified: Secondary | ICD-10-CM | POA: Diagnosis not present

## 2015-10-20 DIAGNOSIS — I5030 Unspecified diastolic (congestive) heart failure: Secondary | ICD-10-CM | POA: Diagnosis not present

## 2015-10-20 DIAGNOSIS — Z471 Aftercare following joint replacement surgery: Secondary | ICD-10-CM | POA: Diagnosis not present

## 2015-10-20 DIAGNOSIS — M1611 Unilateral primary osteoarthritis, right hip: Secondary | ICD-10-CM | POA: Diagnosis not present

## 2015-10-21 DIAGNOSIS — M1611 Unilateral primary osteoarthritis, right hip: Secondary | ICD-10-CM | POA: Diagnosis not present

## 2015-10-21 DIAGNOSIS — M069 Rheumatoid arthritis, unspecified: Secondary | ICD-10-CM | POA: Diagnosis not present

## 2015-10-21 DIAGNOSIS — N189 Chronic kidney disease, unspecified: Secondary | ICD-10-CM | POA: Diagnosis not present

## 2015-10-21 DIAGNOSIS — I5030 Unspecified diastolic (congestive) heart failure: Secondary | ICD-10-CM | POA: Diagnosis not present

## 2015-10-21 DIAGNOSIS — Z471 Aftercare following joint replacement surgery: Secondary | ICD-10-CM | POA: Diagnosis not present

## 2015-10-21 DIAGNOSIS — D61818 Other pancytopenia: Secondary | ICD-10-CM | POA: Diagnosis not present

## 2015-10-26 DIAGNOSIS — N189 Chronic kidney disease, unspecified: Secondary | ICD-10-CM | POA: Diagnosis not present

## 2015-10-26 DIAGNOSIS — D61818 Other pancytopenia: Secondary | ICD-10-CM | POA: Diagnosis not present

## 2015-10-26 DIAGNOSIS — M069 Rheumatoid arthritis, unspecified: Secondary | ICD-10-CM | POA: Diagnosis not present

## 2015-10-26 DIAGNOSIS — Z471 Aftercare following joint replacement surgery: Secondary | ICD-10-CM | POA: Diagnosis not present

## 2015-10-26 DIAGNOSIS — I5030 Unspecified diastolic (congestive) heart failure: Secondary | ICD-10-CM | POA: Diagnosis not present

## 2015-10-26 DIAGNOSIS — M1611 Unilateral primary osteoarthritis, right hip: Secondary | ICD-10-CM | POA: Diagnosis not present

## 2015-10-27 DIAGNOSIS — Z96651 Presence of right artificial knee joint: Secondary | ICD-10-CM | POA: Diagnosis not present

## 2015-10-27 DIAGNOSIS — Z471 Aftercare following joint replacement surgery: Secondary | ICD-10-CM | POA: Diagnosis not present

## 2015-10-28 ENCOUNTER — Ambulatory Visit (HOSPITAL_COMMUNITY): Payer: Medicare Other | Attending: Orthopedic Surgery | Admitting: Physical Therapy

## 2015-10-28 DIAGNOSIS — M25561 Pain in right knee: Secondary | ICD-10-CM

## 2015-10-28 DIAGNOSIS — M25661 Stiffness of right knee, not elsewhere classified: Secondary | ICD-10-CM | POA: Diagnosis not present

## 2015-10-28 DIAGNOSIS — R29898 Other symptoms and signs involving the musculoskeletal system: Secondary | ICD-10-CM | POA: Diagnosis not present

## 2015-10-28 DIAGNOSIS — R600 Localized edema: Secondary | ICD-10-CM

## 2015-10-28 DIAGNOSIS — R262 Difficulty in walking, not elsewhere classified: Secondary | ICD-10-CM | POA: Diagnosis not present

## 2015-10-28 NOTE — Patient Instructions (Signed)
KNEE: Extension, Long Arc Quads - Sitting    Raise leg until knee is straight. _10__ reps per set, _2__ sets per day.  Copyright  VHI. All rights reserved.

## 2015-10-28 NOTE — Therapy (Signed)
[place], [place], [address] Phone: [phone]   Fax:  [phone]  Physical Therapy Evaluation  Patient Details  Name: Melissa Villanueva MRN: [id number] Date of Birth: Nov 04, 1946 Referring Provider: Maureen Ralphs  Encounter Date: 10/28/2015      PT End of Session - 10/28/15 1610    Visit Number 1   Number of Visits 18   Date for PT Re-Evaluation 11/27/15   Authorization Type Medicare   Authorization Time Period 10/28/15-12/28/15   Authorization - Visit Number 1   Authorization - Number of Visits 10   PT Start Time 1430   PT Stop Time [id number]   PT Time Calculation (min) 44 min   Activity Tolerance Patient tolerated treatment well   Behavior During Therapy [facility] for tasks assessed/performed      Past Medical History  Diagnosis Date  . Arthritis   . GERD (gastroesophageal reflux disease)   . Pancytopenia ([place]) 2016  . PONV (postoperative nausea and vomiting)   . Stress incontinence   . Headache     Past Surgical History  Procedure Laterality Date  . Total hip arthroplasty Left   . Cholecystectomy    . Cesarean section    . Eye surgery  Sept 2015    Bilateral Glaucoma with surgery  . Total knee arthroplasty Right 10/12/2015    Procedure: RIGHT TOTAL KNEE ARTHROPLASTY;  Surgeon: Gaynelle Arabian, MD;  Location: [facility];  Service: Orthopedics;  Laterality: Right;    There were no vitals filed for this visit.  Visit Diagnosis:  Right knee pain  Stiffness of right knee  Weakness of right leg  Difficulty walking  Edema of right lower extremity      Subjective Assessment - 10/28/15 1438    Subjective Pt reports that she had R TKA on 11/14. Pt reports that she is still having a lot of pain and swelling in her knee. She is unable to walk long distances, and has been ambulating with a cane for the past year and a half. She has one step to get into her home, which she is able to do if she does it slowly. Pt  reports that her daughter was helping her for the past 2 weeks, but she has now moved back to her home, so the pt is on her own again. She reports that she is able to complete all ADLs independently, but it takes her a little more time.    How long can you sit comfortably? <10 minutes   How long can you stand comfortably? <10 minutes   How long can you walk comfortably? 5 minutes   Patient Stated Goals Return to PLOF, be able to go grocery shopping without pain   Currently in Pain? Yes   Pain Score 4    Pain Location Knee   Pain Orientation Right   Pain Descriptors / Indicators Aching;Dull            [facility] PT Assessment - 10/28/15 0001    Assessment   Medical Diagnosis R TKA   Referring Provider Alusio   Onset Date/Surgical Date 10/12/15   Next MD Visit 11/17/15   Prior Therapy yes- [facility]   Precautions   Precautions None   Restrictions   Weight Bearing Restrictions No   Balance Screen   Has the patient fallen in the past 6 months Yes   How many times? 1   Has the patient had a decrease in activity level because of a  fear of falling?  No   Is the patient reluctant to leave their home because of a fear of falling?  No   Home Environment   Living Environment Private residence   Living Arrangements Alone   Available Help at Discharge Family   Type of [place] to enter   Entrance Stairs-Number of Steps 1   Entrance Stairs-Rails None   Home Layout One level   Prior Function   Level of Independence Independent   Vocation Retired   Leisure Walking   Observation/Other Assessments   Focus on Therapeutic Outcomes (FOTO)  61% limited   Observation/Other Assessments-Edema    Edema Circumferential  RLE roughly 6 cm larger than LLE with circumfrential measure   Circumferential Edema   Circumferential - Right Joint line: 47 cm, suprapatellar: 58 cm, infrapatellar: 44 cm   Circumferential - Left  Joint line: 40.5 cm, suprapatellar: 42 cm, infrapatellar: 37.5  cm   ROM / Strength   AROM / PROM / Strength AROM;Strength   AROM   AROM Assessment Site Knee   Right/Left Knee Right   Right Knee Extension -8   Right Knee Flexion 90   Strength   Strength Assessment Site Hip;Knee   Right Hip Flexion 3-/5   Right Hip Extension 3-/5   Right Hip ABduction 3+/5   Left Hip Flexion 4/5   Left Hip Extension 3/5   Left Hip ABduction 4/5   Right/Left Knee Right;Left   Right Knee Flexion 3-/5   Right Knee Extension 3+/5   Left Knee Flexion 4/5   Left Knee Extension 4+/5   Transfers   Five time sit to stand comments  27.92   Ambulation/Gait   Gait Comments TUG: 23.44"                           PT Education - 10/28/15 1610    Education provided Yes   Education Details Added LAQ to HEP that pt has from [employer]) Educated Patient;Child(ren)   Methods Explanation;Handout   Comprehension Verbalized understanding;Returned demonstration          PT Short Term Goals - 10/28/15 1616    PT SHORT TERM GOAL #1   Title Pt will be independent with HEP.   Time 3   Period Weeks   Status New   PT SHORT TERM GOAL #2   Title Improve R knee extension to -5 degrees or greater to improve gait mechanics.   Time 3   Period Weeks   Status New   PT SHORT TERM GOAL #3   Title Increase R knee flexion ROM to 105 degrees or greater to improve ability to ascend and descend stairs.    Time 3   Period Weeks   Status New   PT SHORT TERM GOAL #4   Title Improve BLE strength evidenced by five time sit to stand time of 22 seconds or less.    Time 3   Period Weeks   Status New   PT SHORT TERM GOAL #5   Title Pt will complete TUG in 17 seconds or less with no AD to demonstrate decreased fall risk.    Time 3   Period Weeks           PT Long Term Goals - 10/28/15 1619    PT LONG TERM GOAL #1   Title Improve R knee ROM to 0-120 degrees to restore normal gait mechanics  and improve stair navigation.   Time 6   Period Weeks   Status New    PT LONG TERM GOAL #2   Title Increase RLE strength to 4+/5 or greater evidenced by five time sit to stand time of 15 seconds or less.    Time 6   Period Weeks   Status New   PT LONG TERM GOAL #3   Title Pt will complete TUG in 12 seconds or less with no AD to demonstrate reduced fall risk.   Time 6   Period Weeks   Status New   PT LONG TERM GOAL #4   Title Reduce edema in R knee to be within 2 cm of L knee to decrease difficulty with dressing.    Time 6   Period Weeks   Status New   PT LONG TERM GOAL #5   Title Pt will ambulate 1,000 feet with no AD with equal weightbearing, equal step length, and <2/10 pain to allow her to complete grocery shopping without rest break.    Time 6   Period Weeks   Status New               Plan - 11-08-2015 1611    Clinical Impression Statement Pt presents to PT following R TKA on 11/14. Pt has significant bruising and swelling present throughout RLE. Upon examination, pt demonstrates decreased R knee ROM, decreased strength of RLE, impaired balance, decreased gait speed, difficulty walking, and edema. Pt will benefit from skilled PT services at this time to address her impairments in order to restore optimal level of function for pt.    Pt will benefit from skilled therapeutic intervention in order to improve on the following deficits Abnormal gait;Decreased activity tolerance;Decreased balance;Decreased endurance;Decreased strength;Decreased range of motion;Difficulty walking;Hypomobility;Increased edema;Pain   Rehab Potential Good   PT Frequency 3x / week   PT Duration 4 weeks  4-6 weeks   PT Treatment/Interventions ADLs/Self Care Home Management;Cryotherapy;Gait training;Stair training;Functional mobility training;Therapeutic activities;Therapeutic exercise;Balance training;Neuromuscular re-education;Patient/family education;Manual techniques;Passive range of motion   PT Next Visit Plan Review goals, begin supine and seated strengthening,  manual therapy for edema          G-Codes - 11/08/15 1623    Functional Assessment Tool Used FOTO   Functional Limitation Mobility: Walking and moving around   Mobility: Walking and Moving Around Current Status [phone]) At least 60 percent but less than 80 percent impaired, limited or restricted   Mobility: Walking and Moving Around Goal Status [phone]) At least 40 percent but less than 60 percent impaired, limited or restricted       Problem List Patient Active Problem List   Diagnosis Date Noted  . OA (osteoarthritis) of knee 10/12/2015  . Pancytopenia ([place]) 10/17/2014  . Rheumatoid arthritis ([place]) 10/17/2014    Hilma Favors, PT, DPT [phone] November 08, 2015, 4:26 PM  [place] [address] [place], [place], [address] Phone: [phone]   Fax:  [phone]  Name: Charlyne Durnan MRN: [id number] Date of Birth: 1946-10-25

## 2015-11-02 ENCOUNTER — Ambulatory Visit (HOSPITAL_COMMUNITY): Payer: Medicare Other | Admitting: Physical Therapy

## 2015-11-03 ENCOUNTER — Ambulatory Visit (HOSPITAL_COMMUNITY): Payer: Medicare Other | Attending: Orthopedic Surgery

## 2015-11-03 DIAGNOSIS — M25561 Pain in right knee: Secondary | ICD-10-CM | POA: Insufficient documentation

## 2015-11-03 DIAGNOSIS — R29898 Other symptoms and signs involving the musculoskeletal system: Secondary | ICD-10-CM | POA: Insufficient documentation

## 2015-11-03 DIAGNOSIS — R600 Localized edema: Secondary | ICD-10-CM | POA: Diagnosis not present

## 2015-11-03 DIAGNOSIS — R262 Difficulty in walking, not elsewhere classified: Secondary | ICD-10-CM | POA: Diagnosis not present

## 2015-11-03 DIAGNOSIS — M25661 Stiffness of right knee, not elsewhere classified: Secondary | ICD-10-CM | POA: Insufficient documentation

## 2015-11-03 NOTE — Therapy (Signed)
[place], [place], [address] Phone: [phone]   Fax:  [phone]  Physical Therapy Treatment  Patient Details  Name: Melissa Villanueva MRN: [id number] Date of Birth: [birth year] Referring Provider: Maureen Ralphs  Encounter Date: 11/03/2015      PT End of Session - 11/03/15 1649    Visit Number 2   Number of Visits 18   Date for PT Re-Evaluation 11/27/15   Authorization Type Medicare   Authorization Time Period 10/28/15-12/28/15   Authorization - Visit Number 2   Authorization - Number of Visits 10   PT Start Time [id number]   PT Stop Time 1728   PT Time Calculation (min) 38 min   Activity Tolerance Patient tolerated treatment well   Behavior During Therapy [facility] for tasks assessed/performed      Past Medical History  Diagnosis Date  . Arthritis   . GERD (gastroesophageal reflux disease)   . Pancytopenia ([place]) 2016  . PONV (postoperative nausea and vomiting)   . Stress incontinence   . Headache     Past Surgical History  Procedure Laterality Date  . Total hip arthroplasty Left   . Cholecystectomy    . Cesarean section    . Eye surgery  Sept 2015    Bilateral Glaucoma with surgery  . Total knee arthroplasty Right 10/12/2015    Procedure: RIGHT TOTAL KNEE ARTHROPLASTY;  Surgeon: Gaynelle Arabian, MD;  Location: [facility];  Service: Orthopedics;  Laterality: Right;    There were no vitals filed for this visit.  Visit Diagnosis:  Right knee pain  Stiffness of right knee  Weakness of right leg  Difficulty walking  Edema of right lower extremity      Subjective Assessment - 11/03/15 1704    Subjective Pt reports compliance with HEP.  Stated pain scale 2-3/10 on Rt knee, has been having problems with her Rt hip, currently not bothering her at entrance to therapy today.   Pertinent History Pt reports that she had R TKA on 11/14. Pt reports that she is still having a lot of pain and swelling in her knee. She is  unable to walk long distances, and has been ambulating with a cane for the past year and a half. She has one step to get into her home, which she is able to do if she does it slowly. Pt reports that her daughter was helping her for the past 2 weeks, but she has now moved back to her home, so the pt is on her own again. She reports that she is able to complete all ADLs independently, but it takes her a little more time.    Patient Stated Goals Return to PLOF, be able to go grocery shopping without pain   Currently in Pain? Yes   Pain Score 2    Pain Location Knee   Pain Orientation Right   Pain Descriptors / Indicators Aching;Sore            [facility] Adult PT Treatment/Exercise - 11/03/15 0001    Exercises   Exercises Knee/Hip   Knee/Hip Exercises: Stretches   Active Hamstring Stretch 3 reps;30 seconds   Active Hamstring Stretch Limitations supine with rope   Gastroc Stretch 3 reps;30 seconds   Gastroc Stretch Limitations long sitting with rope   Knee/Hip Exercises: Seated   Long Arc Quad Right;10 reps   Knee/Hip Exercises: Supine   Quad Sets 2 sets;10 reps   [employer] Limitations 5" holds   Short  Arc [employer] 15 reps   Heel Slides 10 reps   Bridges 10 reps   Manual Therapy   Manual Therapy Edema management;Soft tissue mobilization   Manual therapy comments performed following therex with supine LE elevated   Edema Management Retro massage with LE elevated for edema   Soft tissue mobilization Soft tissue mobilization to quadriceps to reduce tightness            PT Short Term Goals - 10/28/15 1616    PT SHORT TERM GOAL #1   Title Pt will be independent with HEP.   Time 3   Period Weeks   Status New   PT SHORT TERM GOAL #2   Title Improve R knee extension to -5 degrees or greater to improve gait mechanics.   Time 3   Period Weeks   Status New   PT SHORT TERM GOAL #3   Title Increase R knee flexion ROM to 105 degrees or greater to improve ability to ascend and descend  stairs.    Time 3   Period Weeks   Status New   PT SHORT TERM GOAL #4   Title Improve BLE strength evidenced by five time sit to stand time of 22 seconds or less.    Time 3   Period Weeks   Status New   PT SHORT TERM GOAL #5   Title Pt will complete TUG in 17 seconds or less with no AD to demonstrate decreased fall risk.    Time 3   Period Weeks           PT Long Term Goals - 10/28/15 1619    PT LONG TERM GOAL #1   Title Improve R knee ROM to 0-120 degrees to restore normal gait mechanics and improve stair navigation.   Time 6   Period Weeks   Status New   PT LONG TERM GOAL #2   Title Increase RLE strength to 4+/5 or greater evidenced by five time sit to stand time of 15 seconds or less.    Time 6   Period Weeks   Status New   PT LONG TERM GOAL #3   Title Pt will complete TUG in 12 seconds or less with no AD to demonstrate reduced fall risk.   Time 6   Period Weeks   Status New   PT LONG TERM GOAL #4   Title Reduce edema in R knee to be within 2 cm of L knee to decrease difficulty with dressing.    Time 6   Period Weeks   Status New   PT LONG TERM GOAL #5   Title Pt will ambulate 1,000 feet with no AD with equal weightbearing, equal step length, and <2/10 pain to allow her to complete grocery shopping without rest break.    Time 6   Period Weeks   Status New               Plan - 11/03/15 1732    Clinical Impression Statement Reviewed goals, compliance and technique with HEP and copy of evaluation given to pt.  Session focus on improving ROM.  Pt able to demonstrate appropriate form and technique with all exercises following initial cueing.  Ended session with manual techniques for edema and pain control.  Noted reductino in swelling following manual and reports of no pain at end of session.  Pt encouraged to apply ice for pain and edema control once home.     PT Next Visit Plan Continue  PT POC to improve ROM and manual therapy for edema.  Add standing calf  stretch and begin SLR all directions next session.          Problem List Patient Active Problem List   Diagnosis Date Noted  . OA (osteoarthritis) of knee 10/12/2015  . Pancytopenia ([place]) 10/17/2014  . Rheumatoid arthritis [facility]) 10/17/2014   Ihor Austin, [place]; [place]  Aldona Lento 11/03/2015, 5:40 PM  [place], [place], [address] Phone: [phone]   Fax:  [phone]  Name: Melissa Villanueva MRN: [id number] Date of Birth: Jan 19, 1946

## 2015-11-05 ENCOUNTER — Ambulatory Visit (HOSPITAL_COMMUNITY): Payer: Medicare Other | Admitting: Physical Therapy

## 2015-11-05 DIAGNOSIS — R262 Difficulty in walking, not elsewhere classified: Secondary | ICD-10-CM

## 2015-11-05 DIAGNOSIS — M25661 Stiffness of right knee, not elsewhere classified: Secondary | ICD-10-CM | POA: Diagnosis not present

## 2015-11-05 DIAGNOSIS — R600 Localized edema: Secondary | ICD-10-CM

## 2015-11-05 DIAGNOSIS — R29898 Other symptoms and signs involving the musculoskeletal system: Secondary | ICD-10-CM | POA: Diagnosis not present

## 2015-11-05 DIAGNOSIS — M25561 Pain in right knee: Secondary | ICD-10-CM

## 2015-11-05 NOTE — Therapy (Signed)
[place], [place], [address] Phone: [phone]   Fax:  [phone]  Physical Therapy Treatment  Patient Details  Name: Melissa Villanueva MRN: [id number] Date of Birth: 1946-01-21 Referring Provider: Maureen Ralphs  Encounter Date: 11/05/2015      PT End of Session - 11/05/15 1122    Visit Number 3   Number of Visits 18   Date for PT Re-Evaluation 11/27/15   Authorization Type Medicare   Authorization Time Period 10/28/15-12/28/15   Authorization - Visit Number 3   Authorization - Number of Visits 10   PT Start Time 0930   PT Stop Time 1015   PT Time Calculation (min) 45 min   Activity Tolerance Patient tolerated treatment well   Behavior During Therapy [facility] for tasks assessed/performed      Past Medical History  Diagnosis Date  . Arthritis   . GERD (gastroesophageal reflux disease)   . Pancytopenia ([place]) 2016  . PONV (postoperative nausea and vomiting)   . Stress incontinence   . Headache     Past Surgical History  Procedure Laterality Date  . Total hip arthroplasty Left   . Cholecystectomy    . Cesarean section    . Eye surgery  Sept 2015    Bilateral Glaucoma with surgery  . Total knee arthroplasty Right 10/12/2015    Procedure: RIGHT TOTAL KNEE ARTHROPLASTY;  Surgeon: Gaynelle Arabian, MD;  Location: [facility];  Service: Orthopedics;  Laterality: Right;    There were no vitals filed for this visit.  Visit Diagnosis:  Right knee pain  Stiffness of right knee  Weakness of right leg  Difficulty walking  Edema of right lower extremity      Subjective Assessment - 11/05/15 1117    Subjective Pt states she stil has alot of swelling in her LE with difficulty bending it.  Currently 2/10 pain.   Currently in Pain? Yes   Pain Score 2    Pain Location Knee   Pain Orientation Right   Pain Descriptors / Indicators Aching;Sore                         [facility] Adult PT Treatment/Exercise -  11/05/15 1116    Knee/Hip Exercises: Stretches   Active Hamstring Stretch 3 reps;30 seconds   Active Hamstring Stretch Limitations standing 12" step   Knee: Self-Stretch to increase Flexion Right;10 seconds   Knee: Self-Stretch Limitations 8" 10 reps   Gastroc Stretch 3 reps;30 seconds   Gastroc Stretch Limitations slant board   Knee/Hip Exercises: Supine   Quad Sets 2 sets;10 reps   Short Arc [employer] 15 reps   Bridges 10 reps   Straight Leg Raises Right;10 reps   Manual Therapy   Manual Therapy Edema management;Soft tissue mobilization   Manual therapy comments performed following therex with supine LE elevated   Edema Management Retro massage with LE elevated for edema   Soft tissue mobilization Soft tissue mobilization to quadriceps to reduce tightness                  PT Short Term Goals - 10/28/15 1616    PT SHORT TERM GOAL #1   Title Pt will be independent with HEP.   Time 3   Period Weeks   Status New   PT SHORT TERM GOAL #2   Title Improve R knee extension to -5 degrees or greater to improve gait mechanics.   Time 3  Period Weeks   Status New   PT SHORT TERM GOAL #3   Title Increase R knee flexion ROM to 105 degrees or greater to improve ability to ascend and descend stairs.    Time 3   Period Weeks   Status New   PT SHORT TERM GOAL #4   Title Improve BLE strength evidenced by five time sit to stand time of 22 seconds or less.    Time 3   Period Weeks   Status New   PT SHORT TERM GOAL #5   Title Pt will complete TUG in 17 seconds or less with no AD to demonstrate decreased fall risk.    Time 3   Period Weeks           PT Long Term Goals - 10/28/15 1619    PT LONG TERM GOAL #1   Title Improve R knee ROM to 0-120 degrees to restore normal gait mechanics and improve stair navigation.   Time 6   Period Weeks   Status New   PT LONG TERM GOAL #2   Title Increase RLE strength to 4+/5 or greater evidenced by five time sit to stand time of 15  seconds or less.    Time 6   Period Weeks   Status New   PT LONG TERM GOAL #3   Title Pt will complete TUG in 12 seconds or less with no AD to demonstrate reduced fall risk.   Time 6   Period Weeks   Status New   PT LONG TERM GOAL #4   Title Reduce edema in R knee to be within 2 cm of L knee to decrease difficulty with dressing.    Time 6   Period Weeks   Status New   PT LONG TERM GOAL #5   Title Pt will ambulate 1,000 feet with no AD with equal weightbearing, equal step length, and <2/10 pain to allow her to complete grocery shopping without rest break.    Time 6   Period Weeks   Status New               Plan - 11/05/15 1123    Clinical Impression Statement Continued focus on improving ROM.  Added standing hamstring, calf an knee flexion stretches today to POC. Time spent on retro massage Rt knee to decrease swelling and promote decongestion in LE.  Only able to achieve 95 degrees knee flexion this session following manual techniques.   Added straight leg raise to encourage extension by increasing quad strength.    PT Next Visit Plan Continue PT POC to improve ROM and manual therapy for edema.  Add sidelying hip abduction, prone extension and flexion next session.          Problem List Patient Active Problem List   Diagnosis Date Noted  . OA (osteoarthritis) of knee 10/12/2015  . Pancytopenia ([place]) 10/17/2014  . Rheumatoid arthritis ([place]) 10/17/2014    Teena Irani, PTA/CLT [phone]  11/05/2015, 11:29 AM  [place] [address] [place], [place], [address] Phone: [phone]   Fax:  [phone]  Name: Melissa Villanueva MRN: [id number] Date of Birth: 07-01-1946

## 2015-11-06 ENCOUNTER — Ambulatory Visit (HOSPITAL_COMMUNITY): Payer: Medicare Other | Admitting: Physical Therapy

## 2015-11-06 DIAGNOSIS — M25551 Pain in right hip: Secondary | ICD-10-CM | POA: Diagnosis not present

## 2015-11-10 ENCOUNTER — Ambulatory Visit (HOSPITAL_COMMUNITY): Payer: Medicare Other | Admitting: Physical Therapy

## 2015-11-10 DIAGNOSIS — M25561 Pain in right knee: Secondary | ICD-10-CM | POA: Diagnosis not present

## 2015-11-10 DIAGNOSIS — R29898 Other symptoms and signs involving the musculoskeletal system: Secondary | ICD-10-CM

## 2015-11-10 DIAGNOSIS — R262 Difficulty in walking, not elsewhere classified: Secondary | ICD-10-CM

## 2015-11-10 DIAGNOSIS — M25661 Stiffness of right knee, not elsewhere classified: Secondary | ICD-10-CM | POA: Diagnosis not present

## 2015-11-10 DIAGNOSIS — R600 Localized edema: Secondary | ICD-10-CM

## 2015-11-10 NOTE — Therapy (Signed)
[place], [place], [address] Phone: [phone]   Fax:  [phone]  Physical Therapy Treatment  Patient Details  Name: Kingston Mccrite MRN: [id number] Date of Birth: 1946-07-17 Referring Provider: Maureen Ralphs  Encounter Date: 11/10/2015      PT End of Session - 11/10/15 1607    Visit Number 4   Number of Visits 18   Date for PT Re-Evaluation 11/27/15   Authorization Type Medicare   Authorization Time Period 10/28/15-12/28/15   Authorization - Visit Number 4   Authorization - Number of Visits 10   PT Start Time [id number]   PT Stop Time 1605   PT Time Calculation (min) 54 min   Activity Tolerance Patient tolerated treatment well   Behavior During Therapy [facility] for tasks assessed/performed      Past Medical History  Diagnosis Date  . Arthritis   . GERD (gastroesophageal reflux disease)   . Pancytopenia ([place]) 2016  . PONV (postoperative nausea and vomiting)   . Stress incontinence   . Headache     Past Surgical History  Procedure Laterality Date  . Total hip arthroplasty Left   . Cholecystectomy    . Cesarean section    . Eye surgery  Sept 2015    Bilateral Glaucoma with surgery  . Total knee arthroplasty Right 10/12/2015    Procedure: RIGHT TOTAL KNEE ARTHROPLASTY;  Surgeon: Gaynelle Arabian, MD;  Location: [facility];  Service: Orthopedics;  Laterality: Right;    There were no vitals filed for this visit.  Visit Diagnosis:  Right knee pain  Stiffness of right knee  Weakness of right leg  Difficulty walking  Edema of right lower extremity      Subjective Assessment - 11/10/15 1513    Subjective Pt reports that she took a pain pill before coming. Her pain was a 5/10 before the pilll, but now it is about a 2/10.    Currently in Pain? Yes   Pain Score 2    Pain Location Knee   Pain Orientation Right                         [facility] Adult PT Treatment/Exercise - 11/10/15 0001    Knee/Hip Exercises: Stretches   Active Hamstring Stretch 3 reps;30 seconds   Active Hamstring Stretch Limitations standing 12" step   Knee: Self-Stretch to increase Flexion Right;10 seconds   Knee: Self-Stretch Limitations 8" 10 reps   Gastroc Stretch 3 reps;30 seconds   Gastroc Stretch Limitations slant board   Knee/Hip Exercises: Supine   Quad Sets 15 reps   Quad Sets Limitations 3" holds   Short Arc [employer] 15 reps   Short Arc Quad Sets Limitations 3" holds   Heel Slides 15 reps   Knee/Hip Exercises: Sidelying   Hip ABduction 10 reps   Clams 10 reps  performed due to increased pain with hip abduction   Knee/Hip Exercises: Prone   Hamstring Curl 15 reps   Hip Extension 15 reps   Manual Therapy   Manual Therapy Edema management;Soft tissue mobilization;Passive ROM   Manual therapy comments performed following therex with supine LE elevated   Edema Management Retro massage with LE elevated for edema   Soft tissue mobilization Soft tissue mobilization to quadriceps to reduce tightness   Passive ROM performed in prone for knee flexion                  PT Short  Term Goals - 10/28/15 1616    PT SHORT TERM GOAL #1   Title Pt will be independent with HEP.   Time 3   Period Weeks   Status New   PT SHORT TERM GOAL #2   Title Improve R knee extension to -5 degrees or greater to improve gait mechanics.   Time 3   Period Weeks   Status New   PT SHORT TERM GOAL #3   Title Increase R knee flexion ROM to 105 degrees or greater to improve ability to ascend and descend stairs.    Time 3   Period Weeks   Status New   PT SHORT TERM GOAL #4   Title Improve BLE strength evidenced by five time sit to stand time of 22 seconds or less.    Time 3   Period Weeks   Status New   PT SHORT TERM GOAL #5   Title Pt will complete TUG in 17 seconds or less with no AD to demonstrate decreased fall risk.    Time 3   Period Weeks           PT Long Term Goals - 10/28/15 1619    PT  LONG TERM GOAL #1   Title Improve R knee ROM to 0-120 degrees to restore normal gait mechanics and improve stair navigation.   Time 6   Period Weeks   Status New   PT LONG TERM GOAL #2   Title Increase RLE strength to 4+/5 or greater evidenced by five time sit to stand time of 15 seconds or less.    Time 6   Period Weeks   Status New   PT LONG TERM GOAL #3   Title Pt will complete TUG in 12 seconds or less with no AD to demonstrate reduced fall risk.   Time 6   Period Weeks   Status New   PT LONG TERM GOAL #4   Title Reduce edema in R knee to be within 2 cm of L knee to decrease difficulty with dressing.    Time 6   Period Weeks   Status New   PT LONG TERM GOAL #5   Title Pt will ambulate 1,000 feet with no AD with equal weightbearing, equal step length, and <2/10 pain to allow her to complete grocery shopping without rest break.    Time 6   Period Weeks   Status New               Plan - 11/10/15 1607    Clinical Impression Statement Continued with focus on ROM  and strengthening in today's treatment. Therex was progressed to include sidelying hip abduction and prone hip extension and hamstring curls. Pt requried verbal and tactile cueing to prevent hip flexion during sidelying hip abduction, and was only able to tolerate one set due to increased knee pain, exercise was modified to be performed as clamshell instead with resulting decreased pain. Pt is demonstrating improved L knee extension, and was able to complete SLR today with minimal quad lag. She continues to have limitations in flexion ROM. Following PROM in prone, PT achieved PROM of 104 degrees of LLE.    PT Next Visit Plan Continue with ROM activities with emphasis on flexion, manual therapy to reduce swelling, add long arc quads, heel raises, and forward lunges next session if tolerated        Problem List Patient Active Problem List   Diagnosis Date Noted  . OA (osteoarthritis) of knee  10/12/2015  .  Pancytopenia ([place]) 10/17/2014  . Rheumatoid arthritis ([place]) 10/17/2014    Hilma Favors, PT, DPT [phone] 11/10/2015, 4:13 PM  [facility] [address] [place], [place], [address] Phone: [phone]   Fax:  [phone]  Name: Tallis Durban MRN: [id number] Date of Birth: 02-Dec-1945

## 2015-11-11 ENCOUNTER — Ambulatory Visit (HOSPITAL_COMMUNITY): Payer: Medicare Other | Admitting: Physical Therapy

## 2015-11-11 DIAGNOSIS — R29898 Other symptoms and signs involving the musculoskeletal system: Secondary | ICD-10-CM

## 2015-11-11 DIAGNOSIS — M25661 Stiffness of right knee, not elsewhere classified: Secondary | ICD-10-CM

## 2015-11-11 DIAGNOSIS — M25561 Pain in right knee: Secondary | ICD-10-CM | POA: Diagnosis not present

## 2015-11-11 DIAGNOSIS — R600 Localized edema: Secondary | ICD-10-CM | POA: Diagnosis not present

## 2015-11-11 DIAGNOSIS — R262 Difficulty in walking, not elsewhere classified: Secondary | ICD-10-CM | POA: Diagnosis not present

## 2015-11-11 NOTE — Therapy (Signed)
[place], [place], [address] Phone: [phone]   Fax:  [phone]  Physical Therapy Treatment  Patient Details  Name: Melissa Villanueva MRN: [id number] Date of Birth: 1946-01-13 Referring Provider: Maureen Ralphs  Encounter Date: 11/11/2015      PT End of Session - 11/11/15 1633    Visit Number 5   Number of Visits 18   Date for PT Re-Evaluation 11/27/15   Authorization Type Medicare   Authorization Time Period 10/28/15-12/28/15   Authorization - Visit Number 5   Authorization - Number of Visits 10   PT Start Time 1430   PT Stop Time 1515   PT Time Calculation (min) 45 min   Activity Tolerance Patient tolerated treatment well   Behavior During Therapy [facility] for tasks assessed/performed      Past Medical History  Diagnosis Date  . Arthritis   . GERD (gastroesophageal reflux disease)   . Pancytopenia ([place]) 2016  . PONV (postoperative nausea and vomiting)   . Stress incontinence   . Headache     Past Surgical History  Procedure Laterality Date  . Total hip arthroplasty Left   . Cholecystectomy    . Cesarean section    . Eye surgery  Sept 2015    Bilateral Glaucoma with surgery  . Total knee arthroplasty Right 10/12/2015    Procedure: RIGHT TOTAL KNEE ARTHROPLASTY;  Surgeon: Gaynelle Arabian, MD;  Location: [facility];  Service: Orthopedics;  Laterality: Right;    There were no vitals filed for this visit.  Visit Diagnosis:  Right knee pain  Stiffness of right knee  Weakness of right leg  Difficulty walking      Subjective Assessment - 11/11/15 1433    Subjective Pt reports that she felt really good after she left yesterday. She rates her pain as a 1/10 today.    Currently in Pain? Yes   Pain Score 1    Pain Location Knee   Pain Orientation Right                         [facility] Adult PT Treatment/Exercise - 11/11/15 0001    Knee/Hip Exercises: Stretches   Active Hamstring Stretch 3  reps;30 seconds   Active Hamstring Stretch Limitations standing 12" step   Knee: Self-Stretch to increase Flexion Right;10 seconds   Knee: Self-Stretch Limitations 8" 10 reps   Gastroc Stretch 3 reps;30 seconds   Gastroc Stretch Limitations slant board   Knee/Hip Exercises: Aerobic   Recumbent Bike 1 minute seat 9, 3 minutes seat 8 to improve ROM    Knee/Hip Exercises: Seated   Long Arc Quad 15 reps   Knee/Hip Exercises: Supine   Quad Sets 15 reps   [employer] Limitations 3" holds   Short Arc [employer] 15 reps;2 sets   Short Arc Quad Sets Limitations 3" holds   Heel Slides 15 reps   Straight Leg Raises 2 sets;10 reps;Right   Knee Extension Limitations -2   Knee Flexion Limitations 110   Manual Therapy   Manual therapy comments performed following therex with supine LE elevated   Soft tissue mobilization Soft tissue mobilization to quadriceps to reduce tightness   Passive ROM performed in prone for knee flexion                  PT Short Term Goals - 10/28/15 1616    PT SHORT TERM GOAL #1   Title Pt will be  independent with HEP.   Time 3   Period Weeks   Status New   PT SHORT TERM GOAL #2   Title Improve R knee extension to -5 degrees or greater to improve gait mechanics.   Time 3   Period Weeks   Status New   PT SHORT TERM GOAL #3   Title Increase R knee flexion ROM to 105 degrees or greater to improve ability to ascend and descend stairs.    Time 3   Period Weeks   Status New   PT SHORT TERM GOAL #4   Title Improve BLE strength evidenced by five time sit to stand time of 22 seconds or less.    Time 3   Period Weeks   Status New   PT SHORT TERM GOAL #5   Title Pt will complete TUG in 17 seconds or less with no AD to demonstrate decreased fall risk.    Time 3   Period Weeks           PT Long Term Goals - 10/28/15 1619    PT LONG TERM GOAL #1   Title Improve R knee ROM to 0-120 degrees to restore normal gait mechanics and improve stair navigation.    Time 6   Period Weeks   Status New   PT LONG TERM GOAL #2   Title Increase RLE strength to 4+/5 or greater evidenced by five time sit to stand time of 15 seconds or less.    Time 6   Period Weeks   Status New   PT LONG TERM GOAL #3   Title Pt will complete TUG in 12 seconds or less with no AD to demonstrate reduced fall risk.   Time 6   Period Weeks   Status New   PT LONG TERM GOAL #4   Title Reduce edema in R knee to be within 2 cm of L knee to decrease difficulty with dressing.    Time 6   Period Weeks   Status New   PT LONG TERM GOAL #5   Title Pt will ambulate 1,000 feet with no AD with equal weightbearing, equal step length, and <2/10 pain to allow her to complete grocery shopping without rest break.    Time 6   Period Weeks   Status New               Plan - 11/11/15 1634    Clinical Impression Statement Treatment session focused on improving ROM of R knee and functional strength. Recumbent bike was added x 4 minutes to increase flexion ROM, pt was able to move up to seat 8 after 1 minute at seat 9 with full revolutions. Following recumbent cycle, prone PROM,  and heel slides, flexion ROM was measured at 110 degrees. Pt is having significantly reduced swelling and pain in her knee, and is performing well in PT. She continues to demonstrate antalgic gait, which will need to be addressed as treatment continues.    PT Next Visit Plan Gait training without AD, continue with ROM activities, resume hip abduction and extension strengthening        Problem List Patient Active Problem List   Diagnosis Date Noted  . OA (osteoarthritis) of knee 10/12/2015  . Pancytopenia ([place]) 10/17/2014  . Rheumatoid arthritis ([place]) 10/17/2014    Hilma Favors, PT, DPT [phone] 11/11/2015, 4:40 PM  [facility] [address] [place], [place], [address] Phone: [phone]   Fax:  [phone]  Name: Melissa Villanueva MRN:  [id number] Date of Birth: [birth year]

## 2015-11-13 ENCOUNTER — Ambulatory Visit (HOSPITAL_COMMUNITY): Payer: Medicare Other

## 2015-11-13 DIAGNOSIS — M25561 Pain in right knee: Secondary | ICD-10-CM | POA: Diagnosis not present

## 2015-11-13 DIAGNOSIS — R600 Localized edema: Secondary | ICD-10-CM | POA: Diagnosis not present

## 2015-11-13 DIAGNOSIS — R29898 Other symptoms and signs involving the musculoskeletal system: Secondary | ICD-10-CM

## 2015-11-13 DIAGNOSIS — M25661 Stiffness of right knee, not elsewhere classified: Secondary | ICD-10-CM | POA: Diagnosis not present

## 2015-11-13 DIAGNOSIS — R262 Difficulty in walking, not elsewhere classified: Secondary | ICD-10-CM

## 2015-11-13 NOTE — Therapy (Signed)
[place], [place], [address] Phone: [phone]   Fax:  [phone]  Physical Therapy Treatment  Patient Details  Name: Melissa Villanueva MRN: [id number] Date of Birth: [birth year] Referring Provider: Maureen Ralphs  Encounter Date: 11/13/2015      PT End of Session - 11/13/15 1537    Visit Number 6   Number of Visits 18   Date for PT Re-Evaluation 11/27/15   Authorization Type Medicare   Authorization Time Period 10/28/15-12/28/15   Authorization - Visit Number 6   Authorization - Number of Visits 10   PT Start Time 1520   PT Stop Time 1558   PT Time Calculation (min) 38 min   Activity Tolerance Patient tolerated treatment well;Patient limited by pain   Behavior During Therapy [facility] for tasks assessed/performed      Past Medical History  Diagnosis Date  . Arthritis   . GERD (gastroesophageal reflux disease)   . Pancytopenia ([place]) 2016  . PONV (postoperative nausea and vomiting)   . Stress incontinence   . Headache     Past Surgical History  Procedure Laterality Date  . Total hip arthroplasty Left   . Cholecystectomy    . Cesarean section    . Eye surgery  Sept 2015    Bilateral Glaucoma with surgery  . Total knee arthroplasty Right 10/12/2015    Procedure: RIGHT TOTAL KNEE ARTHROPLASTY;  Surgeon: Gaynelle Arabian, MD;  Location: [facility];  Service: Orthopedics;  Laterality: Right;    There were no vitals filed for this visit.  Visit Diagnosis:  Right knee pain  Stiffness of right knee  Weakness of right leg  Difficulty walking  Edema of right lower extremity      Subjective Assessment - 11/13/15 1520    Subjective Pt is not feeling too well today. She reports that she thinks she may have taken too much oxycodone, because her stomach was quite upset. She did not have an opportunity to work on her HEP item or subsequently take any pain meds today, hence she is a bit sore.    Pertinent History Pt  reports that she had R TKA on 11/14. Pt reports that she is still having a lot of pain and swelling in her knee. She is unable to walk long distances, and has been ambulating with a cane for the past year and a half. She has one step to get into her home, which she is able to do if she does it slowly. Pt reports that her daughter was helping her for the past 2 weeks, but she has now moved back to her home, so the pt is on her own again. She reports that she is able to complete all ADLs independently, but it takes her a little more time.    Currently in Pain? Yes   Pain Score 4    Pain Location Knee   Pain Orientation Right                         [facility] Adult PT Treatment/Exercise - 11/13/15 0001    Exercises   Exercises Knee/Hip   Knee/Hip Exercises: Stretches   Active Hamstring Stretch 3 reps;30 seconds   Active Hamstring Stretch Limitations standing 12" step   Knee: Self-Stretch to increase Flexion Right;10 seconds   Knee: Self-Stretch Limitations 12" 10 reps x 10 seconds   Gastroc Stretch 3 reps;30 seconds   Gastroc Stretch Limitations slant board  Knee/Hip Exercises: Aerobic   Recumbent Bike 5 minuts to warmup   Knee/Hip Exercises: Seated   Long Arc Quad 15 reps   Knee/Hip Exercises: Supine   Short Arc Quad Sets 15 reps;2 sets   Short Arc Quad Sets Limitations 3" hold   Heel Slides --   Bridges 10 reps;3 sets  hamstring bridge, clamshell bridge, and ball squeeze  narrow feet.    Straight Leg Raises 10 reps;Right;1 set   Knee Extension Limitations 0   Knee Flexion Limitations 112   Knee/Hip Exercises: Sidelying   Hip ABduction 10 reps;2 sets  standing today, forward flexed, heavy tactile cues for form Also performed 1x10 in supine on R.    Clams --  Integrated c bridge; minA for hold and symmetry.   Manual Therapy   Edema Management Retro massage with LE elevated for edema   Joint Mobilization Medial Tibial Rotation/ Lateral tibial rotation (knee at 60  degrees  Grade IV: 15x1 second each   Passive ROM performed in supine for knee flexion  3x30sec flexion stretch                PT Education - 11/13/15 1536    Education provided No          PT Short Term Goals - 10/28/15 1616    PT SHORT TERM GOAL #1   Title Pt will be independent with HEP.   Time 3   Period Weeks   Status New   PT SHORT TERM GOAL #2   Title Improve R knee extension to -5 degrees or greater to improve gait mechanics.   Time 3   Period Weeks   Status New   PT SHORT TERM GOAL #3   Title Increase R knee flexion ROM to 105 degrees or greater to improve ability to ascend and descend stairs.    Time 3   Period Weeks   Status New   PT SHORT TERM GOAL #4   Title Improve BLE strength evidenced by five time sit to stand time of 22 seconds or less.    Time 3   Period Weeks   Status New   PT SHORT TERM GOAL #5   Title Pt will complete TUG in 17 seconds or less with no AD to demonstrate decreased fall risk.    Time 3   Period Weeks           PT Long Term Goals - 10/28/15 1619    PT LONG TERM GOAL #1   Title Improve R knee ROM to 0-120 degrees to restore normal gait mechanics and improve stair navigation.   Time 6   Period Weeks   Status New   PT LONG TERM GOAL #2   Title Increase RLE strength to 4+/5 or greater evidenced by five time sit to stand time of 15 seconds or less.    Time 6   Period Weeks   Status New   PT LONG TERM GOAL #3   Title Pt will complete TUG in 12 seconds or less with no AD to demonstrate reduced fall risk.   Time 6   Period Weeks   Status New   PT LONG TERM GOAL #4   Title Reduce edema in R knee to be within 2 cm of L knee to decrease difficulty with dressing.    Time 6   Period Weeks   Status New   PT LONG TERM GOAL #5   Title Pt will ambulate 1,000 feet with no  AD with equal weightbearing, equal step length, and <2/10 pain to allow her to complete grocery shopping without rest break.    Time 6   Period Weeks    Status New               Plan - 11/13/15 1541    Clinical Impression Statement Pt doing well today, just a bit more pain. She is making progress in activity tolerance and bale to progress some of her exercises today. She continues to be most limited in flexion ROM, and pain inhibition of strength. Swelling remains dominant from the knee to the ankle, with a turgid, firm, non-pitting edema.    Pt will benefit from skilled therapeutic intervention in order to improve on the following deficits Abnormal gait;Decreased activity tolerance;Decreased balance;Decreased endurance;Decreased strength;Decreased range of motion;Difficulty walking;Hypomobility;Increased edema;Pain   Rehab Potential Good   PT Frequency 3x / week   PT Duration 4 weeks   PT Treatment/Interventions ADLs/Self Care Home Management;Cryotherapy;Gait training;Stair training;Functional mobility training;Therapeutic activities;Therapeutic exercise;Balance training;Neuromuscular re-education;Patient/family education;Manual techniques;Passive range of motion   PT Next Visit Plan Gait training without AD, continue with ROM activities, resume hip abduction and extension strengthening   PT Home Exercise Plan No changes        Problem List Patient Active Problem List   Diagnosis Date Noted  . OA (osteoarthritis) of knee 10/12/2015  . Pancytopenia ([place]) 10/17/2014  . Rheumatoid arthritis ([place]) 10/17/2014    Buccola,Allan C 11/13/2015, 4:06 PM  4:06 PM  Etta Grandchild, PT, DPT [place] License # [id number]       [facility] [address] [place], [place], [address] Phone: [phone]   Fax:  [phone]  Name: Ahlaam Harton MRN: [id number] Date of Birth: 15-Oct-1946

## 2015-11-16 ENCOUNTER — Ambulatory Visit (HOSPITAL_COMMUNITY): Payer: Medicare Other | Admitting: Physical Therapy

## 2015-11-16 DIAGNOSIS — R262 Difficulty in walking, not elsewhere classified: Secondary | ICD-10-CM | POA: Diagnosis not present

## 2015-11-16 DIAGNOSIS — M25661 Stiffness of right knee, not elsewhere classified: Secondary | ICD-10-CM

## 2015-11-16 DIAGNOSIS — R29898 Other symptoms and signs involving the musculoskeletal system: Secondary | ICD-10-CM

## 2015-11-16 DIAGNOSIS — R600 Localized edema: Secondary | ICD-10-CM | POA: Diagnosis not present

## 2015-11-16 DIAGNOSIS — M25561 Pain in right knee: Secondary | ICD-10-CM | POA: Diagnosis not present

## 2015-11-16 NOTE — Therapy (Signed)
[place], [place], [address] Phone: [phone]   Fax:  [phone]  Physical Therapy Treatment  Patient Details  Name: Muntas Recktenwald MRN: [id number] Date of Birth: 29-Apr-1946 Referring Provider: Maureen Ralphs  Encounter Date: 11/16/2015      PT End of Session - 11/16/15 1523    Visit Number 7   Number of Visits 18   Date for PT Re-Evaluation 11/27/15   Authorization Type Medicare   Authorization Time Period 10/28/15-12/28/15   Authorization - Visit Number 7   Authorization - Number of Visits 10   PT Start Time 1430   PT Stop Time 1515   PT Time Calculation (min) 45 min   Activity Tolerance Patient tolerated treatment well   Behavior During Therapy [facility] for tasks assessed/performed      Past Medical History  Diagnosis Date  . Arthritis   . GERD (gastroesophageal reflux disease)   . Pancytopenia ([place]) 2016  . PONV (postoperative nausea and vomiting)   . Stress incontinence   . Headache     Past Surgical History  Procedure Laterality Date  . Total hip arthroplasty Left   . Cholecystectomy    . Cesarean section    . Eye surgery  Sept 2015    Bilateral Glaucoma with surgery  . Total knee arthroplasty Right 10/12/2015    Procedure: RIGHT TOTAL KNEE ARTHROPLASTY;  Surgeon: Gaynelle Arabian, MD;  Location: [facility];  Service: Orthopedics;  Laterality: Right;    There were no vitals filed for this visit.  Visit Diagnosis:  Right knee pain  Stiffness of right knee  Weakness of right leg  Difficulty walking      Subjective Assessment - 11/16/15 1437    Subjective Pt reports that she had a lot of soreness after last session. She states that she feels a little better today.    Currently in Pain? Yes   Pain Score 3    Pain Location Knee   Pain Orientation Right                         [facility] Adult PT Treatment/Exercise - 11/16/15 0001    Knee/Hip Exercises: Stretches   Active  Hamstring Stretch 3 reps;30 seconds   Active Hamstring Stretch Limitations standing 12" step   Knee: Self-Stretch to increase Flexion Right;10 seconds   Knee: Self-Stretch Limitations 12" 10 reps x 10 seconds   Gastroc Stretch 3 reps;30 seconds   Gastroc Stretch Limitations slant board   Knee/Hip Exercises: Aerobic   Recumbent Bike 1.5 minutes seat 8, 3.5 minutes seat 7 to warm up   Knee/Hip Exercises: Standing   Forward Lunges 10 reps   Forward Lunges Limitations 4" step   Lateral Step Up 10 reps;Step Height: 4"   Forward Step Up 10 reps;Step Height: 4"   Stairs 4" step x 1RT   Knee/Hip Exercises: Supine   Short Arc Quad Sets 15 reps;2 sets   Short Arc Quad Sets Limitations 3" hold   Straight Leg Raises 2 sets;10 reps;Right   Knee Extension Limitations 0   Knee Flexion Limitations 113                  PT Short Term Goals - 10/28/15 1616    PT SHORT TERM GOAL #1   Title Pt will be independent with HEP.   Time 3   Period Weeks   Status New   PT SHORT TERM GOAL #2  Title Improve R knee extension to -5 degrees or greater to improve gait mechanics.   Time 3   Period Weeks   Status New   PT SHORT TERM GOAL #3   Title Increase R knee flexion ROM to 105 degrees or greater to improve ability to ascend and descend stairs.    Time 3   Period Weeks   Status New   PT SHORT TERM GOAL #4   Title Improve BLE strength evidenced by five time sit to stand time of 22 seconds or less.    Time 3   Period Weeks   Status New   PT SHORT TERM GOAL #5   Title Pt will complete TUG in 17 seconds or less with no AD to demonstrate decreased fall risk.    Time 3   Period Weeks           PT Long Term Goals - 10/28/15 1619    PT LONG TERM GOAL #1   Title Improve R knee ROM to 0-120 degrees to restore normal gait mechanics and improve stair navigation.   Time 6   Period Weeks   Status New   PT LONG TERM GOAL #2   Title Increase RLE strength to 4+/5 or greater evidenced by five  time sit to stand time of 15 seconds or less.    Time 6   Period Weeks   Status New   PT LONG TERM GOAL #3   Title Pt will complete TUG in 12 seconds or less with no AD to demonstrate reduced fall risk.   Time 6   Period Weeks   Status New   PT LONG TERM GOAL #4   Title Reduce edema in R knee to be within 2 cm of L knee to decrease difficulty with dressing.    Time 6   Period Weeks   Status New   PT LONG TERM GOAL #5   Title Pt will ambulate 1,000 feet with no AD with equal weightbearing, equal step length, and <2/10 pain to allow her to complete grocery shopping without rest break.    Time 6   Period Weeks   Status New               Plan - 11/16/15 1523    Clinical Impression Statement Continued with functional strengthening today with addition of standing strengthening. During treatment session, pt reported that she would need to be able to go up and down steps on Christmas Eve, so this was incorporated into treatment today. Stair training was completed on 4" steps, pt required verbal cueing to prevent circumduction and to perform steps with reciprocal pattern. Pt performed well on stairs, however, she reported that she didn't feel completely confident. Step ups forward and laterally were added to improve quad and glut strength for climbing stairs.    PT Next Visit Plan Stair training on 7" steps to ensure safety when pt needs to climb on 12/24, gait training with no AD, resume manual PRN        Problem List Patient Active Problem List   Diagnosis Date Noted  . OA (osteoarthritis) of knee 10/12/2015  . Pancytopenia ([place]) 10/17/2014  . Rheumatoid arthritis ([place]) 10/17/2014    Hilma Favors, PT, DPT [phone] 11/16/2015, 4:12 PM  [place] [address] [place], [place], [address] Phone: [phone]   Fax:  [phone]  Name: Terriann Albergo MRN: [id number] Date of Birth: 06/22/1946

## 2015-11-17 DIAGNOSIS — Z96651 Presence of right artificial knee joint: Secondary | ICD-10-CM | POA: Diagnosis not present

## 2015-11-17 DIAGNOSIS — Z471 Aftercare following joint replacement surgery: Secondary | ICD-10-CM | POA: Diagnosis not present

## 2015-11-17 DIAGNOSIS — M1611 Unilateral primary osteoarthritis, right hip: Secondary | ICD-10-CM | POA: Diagnosis not present

## 2015-11-18 ENCOUNTER — Ambulatory Visit (HOSPITAL_COMMUNITY): Payer: Medicare Other | Admitting: Physical Therapy

## 2015-11-18 DIAGNOSIS — R262 Difficulty in walking, not elsewhere classified: Secondary | ICD-10-CM | POA: Diagnosis not present

## 2015-11-18 DIAGNOSIS — R600 Localized edema: Secondary | ICD-10-CM | POA: Diagnosis not present

## 2015-11-18 DIAGNOSIS — M25561 Pain in right knee: Secondary | ICD-10-CM

## 2015-11-18 DIAGNOSIS — R29898 Other symptoms and signs involving the musculoskeletal system: Secondary | ICD-10-CM

## 2015-11-18 DIAGNOSIS — M25661 Stiffness of right knee, not elsewhere classified: Secondary | ICD-10-CM

## 2015-11-18 NOTE — Therapy (Signed)
[place], [place], [address] Phone: [phone]   Fax:  [phone]  Physical Therapy Treatment  Patient Details  Name: Melissa Villanueva MRN: [id number] Date of Birth: 09-01-1946 Referring Provider: Maureen Ralphs  Encounter Date: 11/18/2015      PT End of Session - 11/18/15 1747    Visit Number 8   Number of Visits 18   Date for PT Re-Evaluation 11/27/15   Authorization Type Medicare   Authorization Time Period 10/28/15-12/28/15   Authorization - Visit Number 8   Authorization - Number of Visits 10   PT Start Time [id number]   PT Stop Time 1729   PT Time Calculation (min) 40 min   Activity Tolerance Patient tolerated treatment well   Behavior During Therapy [facility] for tasks assessed/performed      Past Medical History  Diagnosis Date  . Arthritis   . GERD (gastroesophageal reflux disease)   . Pancytopenia ([place]) 2016  . PONV (postoperative nausea and vomiting)   . Stress incontinence   . Headache     Past Surgical History  Procedure Laterality Date  . Total hip arthroplasty Left   . Cholecystectomy    . Cesarean section    . Eye surgery  Sept 2015    Bilateral Glaucoma with surgery  . Total knee arthroplasty Right 10/12/2015    Procedure: RIGHT TOTAL KNEE ARTHROPLASTY;  Surgeon: Gaynelle Arabian, MD;  Location: [facility];  Service: Orthopedics;  Laterality: Right;    There were no vitals filed for this visit.  Visit Diagnosis:  Right knee pain  Stiffness of right knee  Weakness of right leg  Difficulty walking  Edema of right lower extremity      Subjective Assessment - 11/18/15 1651    Subjective Patient reports she is doing well today, she is having more pain from her hip more than anything else    Pertinent History Pt reports that she had R TKA on 11/14. Pt reports that she is still having a lot of pain and swelling in her knee. She is unable to walk long distances, and has been ambulating with a cane  for the past year and a half. She has one step to get into her home, which she is able to do if she does it slowly. Pt reports that her daughter was helping her for the past 2 weeks, but she has now moved back to her home, so the pt is on her own again. She reports that she is able to complete all ADLs independently, but it takes her a little more time.    Patient Stated Goals Return to PLOF, be able to go grocery shopping without pain   Currently in Pain? Yes   Pain Score 2    Pain Location Knee   Pain Orientation Right                         [facility] Adult PT Treatment/Exercise - 11/18/15 0001    Knee/Hip Exercises: Stretches   Active Hamstring Stretch 3 reps;30 seconds   Active Hamstring Stretch Limitations standing 12" step   Knee: Self-Stretch to increase Flexion Right;10 seconds   Knee: Self-Stretch Limitations 12" 10 reps x 10 seconds   Gastroc Stretch 3 reps;30 seconds   Gastroc Stretch Limitations slant board   Knee/Hip Exercises: Standing   Forward Lunges 10 reps   Forward Lunges Limitations 4" step   Forward Step Up 10 reps;Step Height: 4"  Rocker Board Limitations x20AP and lateral with U HHA   Gait Training x253ft with SPC, cues for heel toe pattern    Knee/Hip Exercises: Supine   Quad Sets --   [employer] Limitations 3 second holds    Short Arc [employer] 15 reps;2 sets   [employer] Sets Limitations 3 second holds    Straight Leg Raises 1 set;15 reps   Manual Therapy   Manual therapy comments performed following therex and prior to gait  with supine LE elevated   Edema Management Retro massage with LE elevated for edema                PT Education - 11/18/15 1747    Education provided No          PT Short Term Goals - 10/28/15 1616    PT SHORT TERM GOAL #1   Title Pt will be independent with HEP.   Time 3   Period Weeks   Status New   PT SHORT TERM GOAL #2   Title Improve R knee extension to -5 degrees or greater to improve gait  mechanics.   Time 3   Period Weeks   Status New   PT SHORT TERM GOAL #3   Title Increase R knee flexion ROM to 105 degrees or greater to improve ability to ascend and descend stairs.    Time 3   Period Weeks   Status New   PT SHORT TERM GOAL #4   Title Improve BLE strength evidenced by five time sit to stand time of 22 seconds or less.    Time 3   Period Weeks   Status New   PT SHORT TERM GOAL #5   Title Pt will complete TUG in 17 seconds or less with no AD to demonstrate decreased fall risk.    Time 3   Period Weeks           PT Long Term Goals - 10/28/15 1619    PT LONG TERM GOAL #1   Title Improve R knee ROM to 0-120 degrees to restore normal gait mechanics and improve stair navigation.   Time 6   Period Weeks   Status New   PT LONG TERM GOAL #2   Title Increase RLE strength to 4+/5 or greater evidenced by five time sit to stand time of 15 seconds or less.    Time 6   Period Weeks   Status New   PT LONG TERM GOAL #3   Title Pt will complete TUG in 12 seconds or less with no AD to demonstrate reduced fall risk.   Time 6   Period Weeks   Status New   PT LONG TERM GOAL #4   Title Reduce edema in R knee to be within 2 cm of L knee to decrease difficulty with dressing.    Time 6   Period Weeks   Status New   PT LONG TERM GOAL #5   Title Pt will ambulate 1,000 feet with no AD with equal weightbearing, equal step length, and <2/10 pain to allow her to complete grocery shopping without rest break.    Time 6   Period Weeks   Status New               Plan - 11/18/15 1747    Clinical Impression Statement Continue functional strengthening and stretching today, as well as expansion of functional standing exercises today. Patient contniues to have difficulty eliminating circumduction on  affected LE however and patient did have knee pain with step ups today. Worked on ambulation with focus on heel-toe patten. Retro massage was performed separately from ther ex and  gait today.    Pt will benefit from skilled therapeutic intervention in order to improve on the following deficits Abnormal gait;Decreased activity tolerance;Decreased balance;Decreased endurance;Decreased strength;Decreased range of motion;Difficulty walking;Hypomobility;Increased edema;Pain   Rehab Potential Good   PT Frequency 3x / week   PT Duration 4 weeks   PT Treatment/Interventions ADLs/Self Care Home Management;Cryotherapy;Gait training;Stair training;Functional mobility training;Therapeutic activities;Therapeutic exercise;Balance training;Neuromuscular re-education;Patient/family education;Manual techniques;Passive range of motion   PT Next Visit Plan Stair training on 7" steps to ensure safety when pt needs to climb on 12/24, gait training with no AD, resume manual PRN   PT Home Exercise Plan No changes   Consulted and Agree with Plan of Care Patient        Problem List Patient Active Problem List   Diagnosis Date Noted  . OA (osteoarthritis) of knee 10/12/2015  . Pancytopenia ([place]) 10/17/2014  . Rheumatoid arthritis ([place]) 10/17/2014    Deniece Ree PT, DPT [place] [address] [place], [place], [address] Phone: [phone]   Fax:  [phone]  Name: Melissa Villanueva MRN: [id number] Date of Birth: 06/18/1946

## 2015-11-24 ENCOUNTER — Ambulatory Visit (HOSPITAL_COMMUNITY): Payer: Medicare Other | Admitting: Physical Therapy

## 2015-11-24 DIAGNOSIS — R600 Localized edema: Secondary | ICD-10-CM

## 2015-11-24 DIAGNOSIS — R29898 Other symptoms and signs involving the musculoskeletal system: Secondary | ICD-10-CM

## 2015-11-24 DIAGNOSIS — M25561 Pain in right knee: Secondary | ICD-10-CM | POA: Diagnosis not present

## 2015-11-24 DIAGNOSIS — M25661 Stiffness of right knee, not elsewhere classified: Secondary | ICD-10-CM | POA: Diagnosis not present

## 2015-11-24 DIAGNOSIS — R262 Difficulty in walking, not elsewhere classified: Secondary | ICD-10-CM | POA: Diagnosis not present

## 2015-11-24 NOTE — Therapy (Addendum)
[place], [place], [address] Phone: [phone]   Fax:  [phone]  Physical Therapy Treatment  Patient Details  Name: Melissa Villanueva MRN: [id number] Date of Birth: [birth year] Referring Provider: Maureen Ralphs  Encounter Date: 11/24/2015      PT End of Session - 11/24/15 1430    Visit Number 9   Number of Visits 18   Authorization Type Medicare   Authorization Time Period 10/28/15-12/28/15   Authorization - Visit Number 9   Authorization - Number of Visits 19   PT Start Time 1350   PT Stop Time 1430   PT Time Calculation (min) 40 min      Past Medical History  Diagnosis Date  . Arthritis   . GERD (gastroesophageal reflux disease)   . Pancytopenia ([place]) 2016  . PONV (postoperative nausea and vomiting)   . Stress incontinence   . Headache     Past Surgical History  Procedure Laterality Date  . Total hip arthroplasty Left   . Cholecystectomy    . Cesarean section    . Eye surgery  Sept 2015    Bilateral Glaucoma with surgery  . Total knee arthroplasty Right 10/12/2015    Procedure: RIGHT TOTAL KNEE ARTHROPLASTY;  Surgeon: Gaynelle Arabian, MD;  [facility];  Service: Orthopedics;  Laterality: Right;    There were no vitals filed for this visit.  Visit Diagnosis:  Right knee pain  Stiffness of right knee  Weakness of right leg  Difficulty walking  Edema of right lower extremity      Subjective Assessment - 11/24/15 1353    Subjective Pt states she does not use her cane 75% of the time while in the house.   She is having the most difficulty with sit to stand due to stiffness. Difficult to get into her lower cabinets.    Pertinent History Pt reports that she had R TKA on 11/14. Pt reports that she is still having a lot of pain and swelling in her knee. She is unable to walk long distances, and has been ambulating with a cane for the past year and a half. She has one step to get into her home,  which she is able to do if she does it slowly. Pt reports that her daughter was helping her for the past 2 weeks, but she has now moved back to her home, so the pt is on her own again. She reports that she is able to complete all ADLs independently, but it takes her a little more time.    How long can you sit comfortably? able to sit for an hour was less than 10 minutes    How long can you stand comfortably? able to stand but leans onto her Lt leg after 10 mintues.    How long can you walk comfortably? able to walk for 15 mintues was 5.     Patient Stated Goals Return to [facility], be able to go grocery shopping without pain            [facility] PT Assessment - 11/24/15 0001    Assessment   Medical Diagnosis R TKA   Onset Date/Surgical Date 10/12/15   Next MD Visit 11/17/15   Prior Therapy yes- HHPT   Precautions   Precautions None   Restrictions   Weight Bearing Restrictions No   Balance Screen   Has the patient fallen in the past 6 months Yes   How many times? 1  Has the patient had a decrease in activity level because of a fear of falling?  No   Is the patient reluctant to leave their home because of a fear of falling?  No   Home Environment   Living Environment Private residence   Living Arrangements Alone   Available Help at Discharge Family   Type of [place] to enter   Entrance Stairs-Number of Steps 1   Entrance Stairs-Rails None   Home Layout One level   Prior Function   Level of [place] Retired   Leisure Walking   Observation/Other Assessments   Focus on Therapeutic Outcomes (FOTO)  61% limited   Observation/Other Assessments-Edema    Edema Circumferential  RLE roughly 6 cm larger than LLE with circumfrential measure   Circumferential Edema   Circumferential - Right Joint line: 43,6 was 47 cm, suprapatellar: 45.3 was58 cm, infrapatellar:43.7 was  44 cm   Circumferential - Left  Joint line: 40.5 cm, suprapatellar: 42  cm, infrapatellar: 37.5 cm   AROM   Right Knee Extension 3  was 8   Right Knee Flexion 108  was 90   Strength   Right Hip Flexion 5/5  was 3-/5    Right Hip Extension 3/5  was a 3-/5    Right Hip ABduction 3+/5  was 3+/5    Left Hip Flexion 5/5  was 4/5    Left Hip Extension 4-/5  was 3/5    Left Hip ABduction 5/5  was 4/5    Right Knee Flexion 4+/5  was 3-/5    Right Knee Extension 4/5  was 3+/5    Left Knee Flexion 4+/5  was 4/5    Left Knee Extension 5/5  was 4+/5    Transfers   Five time sit to stand comments  23.90 was 27.92    Ambulation/Gait   Gait Comments TUG 16.87 was 23                     [facility] Adult PT Treatment/Exercise - 11/24/15 0001    Knee/Hip Exercises: Aerobic   Recumbent Bike 8"   Knee/Hip Exercises: Supine   Quad Sets 10 reps   Heel Slides 10 reps   Knee/Hip Exercises: Sidelying   Hip ABduction Strengthening;Left;10 reps   Knee/Hip Exercises: Prone   Hip Extension Both;10 reps   Manual Therapy   Manual Therapy Edema management   Manual therapy comments decongestive techniques as well as patela mobilization         All manual was completed separate from any other aspect to today's treatment.           PT Short Term Goals - 11/24/15 1422    PT SHORT TERM GOAL #1   Title Pt will be independent with HEP.   Time 3   Period Weeks   Status Achieved   PT SHORT TERM GOAL #2   Title Improve R knee extension to -5 degrees or greater to improve gait mechanics.   Time 3   Period Weeks   Status Achieved   PT SHORT TERM GOAL #3   Title Increase R knee flexion ROM to 105 degrees or greater to improve ability to ascend and descend stairs.    Time 3   Period Weeks   Status Achieved   PT SHORT TERM GOAL #4   Title Improve BLE strength evidenced by five time sit to stand time of 22 seconds or less.  Time 3   Period Weeks   Status On-going   PT SHORT TERM GOAL #5   Title Pt will complete TUG in 17 seconds or less with no  AD to demonstrate decreased fall risk.    Time 3   Period Weeks   Status Achieved           PT Long Term Goals - 12/04/15 1423    PT LONG TERM GOAL #1   Title Improve R knee ROM to 0-120 degrees to restore normal gait mechanics and improve stair navigation.   Time 6   Period Weeks   Status On-going   PT LONG TERM GOAL #2   Title Increase RLE strength to 4+/5 or greater evidenced by five time sit to stand time of 15 seconds or less.    Time 6   Period Weeks   Status On-going   PT LONG TERM GOAL #3   Title Pt will complete TUG in 12 seconds or less with no AD to demonstrate reduced fall risk.   Time 6   Period Weeks   Status On-going   PT LONG TERM GOAL #4   Title Reduce edema in R knee to be within 2 cm of L knee to decrease difficulty with dressing.    Time 6   Period Weeks   Status Achieved   PT LONG TERM GOAL #5   Title Pt will ambulate 1,000 feet with no AD with equal weightbearing, equal step length, and <2/10 pain to allow her to complete grocery shopping without rest break.    Time 6   Period Weeks   Status Achieved               Plan - 12-04-15 1431    Clinical Impression Statement PT reassessed with noted improvement in ROM and swelling.  Pt is progressing in strength but still has significant limitation in hip extension and abduction.     PT Next Visit Plan Continue to work on gait, strength and balance.  Begin stairs on 7"           G-Codes - 12/04/15 1433    Functional Assessment Tool Used ROM, Gt and strength    Functional Limitation Mobility: Walking and moving around   Mobility: Walking and Moving Around Current Status [phone]) At least 40 percent but less than 60 percent impaired, limited or restricted   Mobility: Walking and Moving Around Goal Status [phone]) At least 40 percent but less than 60 percent impaired, limited or restricted      Problem List Patient Active Problem List   Diagnosis Date Noted  . OA (osteoarthritis) of knee  10/12/2015  . Pancytopenia ([place]) 10/17/2014  . Rheumatoid arthritis [facility]) 10/17/2014    Rayetta Humphrey, PT CLT [phone] 12/04/15, 2:34 PM  [place] [address] [place], [place], [address] Phone: [phone]   Fax:  [phone]  Name: Melissa Villanueva MRN: [id number] Date of Birth: 1946/11/08  Physical Therapy Progress Note  Dates of Reporting Period: 11/30 to 12/04/2023  Objective Reports of Subjective Statement: Still stiff and weak  Objective Measurements: see above   Goal Update: see above Plan: see above  Reason Skilled Services are Required: Pt still has significant edema, decreased strength and decreased ROM.  Pt is still using a cane for community ambulation with antalgic gait and difficulty with prolong ambulation.  Pt continues to have pain and difficulty with stair climbing.    Rayetta Humphrey, [place] CLT [phone]

## 2015-11-25 ENCOUNTER — Ambulatory Visit (HOSPITAL_COMMUNITY): Payer: Medicare Other

## 2015-11-25 DIAGNOSIS — M25661 Stiffness of right knee, not elsewhere classified: Secondary | ICD-10-CM | POA: Diagnosis not present

## 2015-11-25 DIAGNOSIS — R600 Localized edema: Secondary | ICD-10-CM

## 2015-11-25 DIAGNOSIS — R29898 Other symptoms and signs involving the musculoskeletal system: Secondary | ICD-10-CM

## 2015-11-25 DIAGNOSIS — R262 Difficulty in walking, not elsewhere classified: Secondary | ICD-10-CM | POA: Diagnosis not present

## 2015-11-25 DIAGNOSIS — M25561 Pain in right knee: Secondary | ICD-10-CM | POA: Diagnosis not present

## 2015-11-25 NOTE — Therapy (Signed)
[place], [place], [address] Phone: [phone]   Fax:  [phone]  Physical Therapy Treatment  Patient Details  Name: Melissa Villanueva MRN: [id number] Date of Birth: 1946-11-05 Referring Provider: Maureen Ralphs  Encounter Date: 11/25/2015      PT End of Session - 11/25/15 1614    Visit Number 10   Number of Visits 16   Date for PT Re-Evaluation 12/21/14   Authorization Type Medicare G code 9th visit   Authorization Time Period 10/28/15-12/28/15   Authorization - Visit Number 10   Authorization - Number of Visits 19   PT Start Time [id number]   PT Stop Time [id number]   PT Time Calculation (min) 46 min   Activity Tolerance Patient tolerated treatment well   Behavior During Therapy [facility] for tasks assessed/performed      Past Medical History  Diagnosis Date  . Arthritis   . GERD (gastroesophageal reflux disease)   . Pancytopenia ([place]) 2016  . PONV (postoperative nausea and vomiting)   . Stress incontinence   . Headache     Past Surgical History  Procedure Laterality Date  . Total hip arthroplasty Left   . Cholecystectomy    . Cesarean section    . Eye surgery  Sept 2015    Bilateral Glaucoma with surgery  . Total knee arthroplasty Right 10/12/2015    Procedure: RIGHT TOTAL KNEE ARTHROPLASTY;  Surgeon: Gaynelle Arabian, MD;  Location: [facility];  Service: Orthopedics;  Laterality: Right;    There were no vitals filed for this visit.  Visit Diagnosis:  Right knee pain  Stiffness of right knee  Weakness of right leg  Difficulty walking  Edema of right lower extremity      Subjective Assessment - 11/25/15 1601    Subjective Pt stated her knee is feeling good today, her Rt hip has been bothering her, pain scale 3-4/10.   Pertinent History Pt reports that she had R TKA on 11/14. Pt reports that she is still having a lot of pain and swelling in her knee. She is unable to walk long distances, and has been ambulating  with a cane for the past year and a half. She has one step to get into her home, which she is able to do if she does it slowly. Pt reports that her daughter was helping her for the past 2 weeks, but she has now moved back to her home, so the pt is on her own again. She reports that she is able to complete all ADLs independently, but it takes her a little more time.    Currently in Pain? Yes   Pain Score 4    Pain Location Hip   Pain Orientation Right           [facility] Adult PT Treatment/Exercise - 11/25/15 0001    Knee/Hip Exercises: Aerobic   Recumbent Bike 8" seat 8 for ROM and reduce stiffness   Knee/Hip Exercises: Standing   Forward Lunges 10 reps   Forward Lunges Limitations 4" step   Step Down Right;10 reps;Hand Hold: 2;Step Height: 4"   Step Down Limitations cueing to reduce rotation with step down   Stairs Trial with 7In step; too much pulling up with LE4" step x 3RT   SLS R 2x 30" holds with 1 finger assistance, Lt 13" max of 3   Other Standing Knee Exercises sidestepping with GTB   Knee/Hip Exercises: Supine   Quad Sets 10 reps  Heel Slides 10 reps   Straight Leg Raises Limitations time   Manual Therapy   Manual Therapy Edema management   Manual therapy comments Supine position with LE elevated end of session   Edema Management Retro massage with LE elevated for edema                  PT Short Term Goals - 11/24/15 1422    PT SHORT TERM GOAL #1   Title Pt will be independent with HEP.   Time 3   Period Weeks   Status Achieved   PT SHORT TERM GOAL #2   Title Improve R knee extension to -5 degrees or greater to improve gait mechanics.   Time 3   Period Weeks   Status Achieved   PT SHORT TERM GOAL #3   Title Increase R knee flexion ROM to 105 degrees or greater to improve ability to ascend and descend stairs.    Time 3   Period Weeks   Status Achieved   PT SHORT TERM GOAL #4   Title Improve BLE strength evidenced by five time sit to stand time of 22  seconds or less.    Time 3   Period Weeks   Status On-going   PT SHORT TERM GOAL #5   Title Pt will complete TUG in 17 seconds or less with no AD to demonstrate decreased fall risk.    Time 3   Period Weeks   Status Achieved           PT Long Term Goals - 11/24/15 1423    PT LONG TERM GOAL #1   Title Improve R knee ROM to 0-120 degrees to restore normal gait mechanics and improve stair navigation.   Time 6   Period Weeks   Status On-going   PT LONG TERM GOAL #2   Title Increase RLE strength to 4+/5 or greater evidenced by five time sit to stand time of 15 seconds or less.    Time 6   Period Weeks   Status On-going   PT LONG TERM GOAL #3   Title Pt will complete TUG in 12 seconds or less with no AD to demonstrate reduced fall risk.   Time 6   Period Weeks   Status On-going   PT LONG TERM GOAL #4   Title Reduce edema in R knee to be within 2 cm of L knee to decrease difficulty with dressing.    Time 6   Period Weeks   Status Achieved   PT LONG TERM GOAL #5   Title Pt will ambulate 1,000 feet with no AD with equal weightbearing, equal step length, and <2/10 pain to allow her to complete grocery shopping without rest break.    Time 6   Period Weeks   Status Achieved               Plan - 11/25/15 1655    Clinical Impression Statement Continued with ROM exercises initiially this session to improve flexion and reduce stiffness.  Progressed functional strenghtening exercises and began balance activities to reduce risk of injury.  HHA required for SLS with Rt LE.  Trial with 7 in stair training, pt not ready for increased height as used too much UE assistance to pull herself up.  Resumed 4in stair training  with moderate cueing for Rt knee flexion and reduce rotation as compensation to reduce knee flexion.  Added sidestepping for glut med strengthening and encouraged pt to begin SLR  all directions at home.  Ended session with manual techniques for edema control to assist  with AROM.     PT Next Visit Plan Continue to work on gait, strength and balance.  Begin stairs on 7" when ready       Problem List Patient Active Problem List   Diagnosis Date Noted  . OA (osteoarthritis) of knee 10/12/2015  . Pancytopenia ([place]) 10/17/2014  . Rheumatoid arthritis [facility]) 10/17/2014   Ihor Austin, [place]; [place]  Aldona Lento 11/25/2015, 5:05 PM  [place] [address] [place], [place], [address] Phone: [phone]   Fax:  [phone]  Name: Melissa Villanueva MRN: [id number] Date of Birth: 03-01-1946

## 2015-12-01 ENCOUNTER — Ambulatory Visit (HOSPITAL_COMMUNITY): Payer: Medicare Other | Attending: Orthopedic Surgery | Admitting: Physical Therapy

## 2015-12-01 DIAGNOSIS — M25561 Pain in right knee: Secondary | ICD-10-CM | POA: Diagnosis not present

## 2015-12-01 DIAGNOSIS — R29898 Other symptoms and signs involving the musculoskeletal system: Secondary | ICD-10-CM | POA: Diagnosis not present

## 2015-12-01 DIAGNOSIS — R262 Difficulty in walking, not elsewhere classified: Secondary | ICD-10-CM | POA: Insufficient documentation

## 2015-12-01 DIAGNOSIS — M25661 Stiffness of right knee, not elsewhere classified: Secondary | ICD-10-CM | POA: Diagnosis not present

## 2015-12-01 DIAGNOSIS — R600 Localized edema: Secondary | ICD-10-CM | POA: Diagnosis not present

## 2015-12-01 NOTE — Therapy (Signed)
[place], [place], [address] Phone: [phone]   Fax:  [phone]  Physical Therapy Treatment  Patient Details  Name: Melissa Villanueva MRN: [id number] Date of Birth: Jan 31, 1946 Referring Provider: Maureen Ralphs  Encounter Date: 12/01/2015      PT End of Session - 12/01/15 1740    Visit Number 10   Number of Visits 16   Date for PT Re-Evaluation 12/21/14   Authorization Type Medicare G code 9th visit   Authorization Time Period 10/28/15-12/28/15   Authorization - Visit Number 10   Authorization - Number of Visits 19   PT Start Time [id number]   PT Stop Time 1735   PT Time Calculation (min) 47 min   Activity Tolerance Patient tolerated treatment well   Behavior During Therapy [facility] for tasks assessed/performed      Past Medical History  Diagnosis Date  . Arthritis   . GERD (gastroesophageal reflux disease)   . Pancytopenia ([place]) 2016  . PONV (postoperative nausea and vomiting)   . Stress incontinence   . Headache     Past Surgical History  Procedure Laterality Date  . Total hip arthroplasty Left   . Cholecystectomy    . Cesarean section    . Eye surgery  Sept 2015    Bilateral Glaucoma with surgery  . Total knee arthroplasty Right 10/12/2015    Procedure: RIGHT TOTAL KNEE ARTHROPLASTY;  Surgeon: Gaynelle Arabian, MD;  Location: [facility];  Service: Orthopedics;  Laterality: Right;    There were no vitals filed for this visit.  Visit Diagnosis:  Right knee pain  Stiffness of right knee  Weakness of right leg  Difficulty walking  Edema of right lower extremity      Subjective Assessment - 12/01/15 1711    Subjective Pt stated her knee is hurting today but her hip is not. Pain scale 3/10.   Currently in Pain? Yes   Pain Score 3    Pain Location Knee   Pain Orientation Right                         [facility] Adult PT Treatment/Exercise - 12/01/15 1655    Knee/Hip Exercises: Stretches   Gastroc Stretch 3 reps;30 seconds   Gastroc Stretch Limitations slant board   Knee/Hip Exercises: Aerobic   Recumbent Bike 8" seat 6 for ROM and reduce stiffness   Knee/Hip Exercises: Standing   Forward Lunges 10 reps   Forward Lunges Limitations 4" step 1 UE only   Lateral Step Up Right;10 reps;Step Height: 4";Hand Hold: 1   Forward Step Up 10 reps;Step Height: 4";Hand Hold: 1   Other Standing Knee Exercises sidestepping with RTB   Knee/Hip Exercises: Supine   Heel Slides 10 reps   Knee Extension Limitations 0   Knee Flexion Limitations 120   Manual Therapy   Manual Therapy Myofascial release   Soft tissue mobilization to decrease adhesions                  PT Short Term Goals - 11/24/15 1422    PT SHORT TERM GOAL #1   Title Pt will be independent with HEP.   Time 3   Period Weeks   Status Achieved   PT SHORT TERM GOAL #2   Title Improve R knee extension to -5 degrees or greater to improve gait mechanics.   Time 3   Period Weeks   Status Achieved   PT SHORT  TERM GOAL #3   Title Increase R knee flexion ROM to 105 degrees or greater to improve ability to ascend and descend stairs.    Time 3   Period Weeks   Status Achieved   PT SHORT TERM GOAL #4   Title Improve BLE strength evidenced by five time sit to stand time of 22 seconds or less.    Time 3   Period Weeks   Status On-going   PT SHORT TERM GOAL #5   Title Pt will complete TUG in 17 seconds or less with no AD to demonstrate decreased fall risk.    Time 3   Period Weeks   Status Achieved           PT Long Term Goals - 11/24/15 1423    PT LONG TERM GOAL #1   Title Improve R knee ROM to 0-120 degrees to restore normal gait mechanics and improve stair navigation.   Time 6   Period Weeks   Status On-going   PT LONG TERM GOAL #2   Title Increase RLE strength to 4+/5 or greater evidenced by five time sit to stand time of 15 seconds or less.    Time 6   Period Weeks   Status On-going   PT LONG TERM  GOAL #3   Title Pt will complete TUG in 12 seconds or less with no AD to demonstrate reduced fall risk.   Time 6   Period Weeks   Status On-going   PT LONG TERM GOAL #4   Title Reduce edema in R knee to be within 2 cm of L knee to decrease difficulty with dressing.    Time 6   Period Weeks   Status Achieved   PT LONG TERM GOAL #5   Title Pt will ambulate 1,000 feet with no AD with equal weightbearing, equal step length, and <2/10 pain to allow her to complete grocery shopping without rest break.    Time 6   Period Weeks   Status Achieved               Plan - 12/01/15 1741    Clinical Impression Statement Pt comes today without AD.  States her knee is bothering her more than her hip today.  Decreased to 1 UE with all standing exercises today with noted diffiuculty completing 4" steps with the decreased UE assistance.  Improved AROM today 0-120 degrees.  Completed myofascial techniques to decrease adhesions and improve pliability of tissue.    PT Next Visit Plan Continue to work on gait, strength and balance.  Begin stairs on 7" when ready        Problem List Patient Active Problem List   Diagnosis Date Noted  . OA (osteoarthritis) of knee 10/12/2015  . Pancytopenia ([place]) 10/17/2014  . Rheumatoid arthritis ([place]) 10/17/2014    Teena Irani, PTA/CLT [phone]  12/01/2015, 5:50 PM  [place] [address] [place], [place], [address] Phone: [phone]   Fax:  [phone]  Name: Melissa Villanueva MRN: [id number] Date of Birth: September 22, 1946

## 2015-12-02 ENCOUNTER — Ambulatory Visit (HOSPITAL_COMMUNITY): Payer: Medicare Other | Admitting: Physical Therapy

## 2015-12-02 ENCOUNTER — Telehealth (HOSPITAL_COMMUNITY): Payer: Self-pay | Admitting: Physical Therapy

## 2015-12-02 DIAGNOSIS — I5032 Chronic diastolic (congestive) heart failure: Secondary | ICD-10-CM | POA: Diagnosis not present

## 2015-12-02 DIAGNOSIS — E785 Hyperlipidemia, unspecified: Secondary | ICD-10-CM | POA: Diagnosis not present

## 2015-12-02 DIAGNOSIS — N183 Chronic kidney disease, stage 3 (moderate): Secondary | ICD-10-CM | POA: Diagnosis not present

## 2015-12-02 DIAGNOSIS — M8580 Other specified disorders of bone density and structure, unspecified site: Secondary | ICD-10-CM | POA: Diagnosis not present

## 2015-12-02 DIAGNOSIS — M150 Primary generalized (osteo)arthritis: Secondary | ICD-10-CM | POA: Diagnosis not present

## 2015-12-02 DIAGNOSIS — D61818 Other pancytopenia: Secondary | ICD-10-CM | POA: Diagnosis not present

## 2015-12-02 DIAGNOSIS — E559 Vitamin D deficiency, unspecified: Secondary | ICD-10-CM | POA: Diagnosis not present

## 2015-12-02 DIAGNOSIS — D709 Neutropenia, unspecified: Secondary | ICD-10-CM | POA: Diagnosis not present

## 2015-12-02 DIAGNOSIS — M0589 Other rheumatoid arthritis with rheumatoid factor of multiple sites: Secondary | ICD-10-CM | POA: Diagnosis not present

## 2015-12-02 NOTE — Telephone Encounter (Signed)
She can not come in today. NF 12/02/15

## 2015-12-07 ENCOUNTER — Ambulatory Visit (HOSPITAL_COMMUNITY): Payer: Medicare Other | Admitting: Physical Therapy

## 2015-12-07 DIAGNOSIS — R262 Difficulty in walking, not elsewhere classified: Secondary | ICD-10-CM

## 2015-12-07 DIAGNOSIS — M25661 Stiffness of right knee, not elsewhere classified: Secondary | ICD-10-CM

## 2015-12-07 DIAGNOSIS — R600 Localized edema: Secondary | ICD-10-CM | POA: Diagnosis not present

## 2015-12-07 DIAGNOSIS — M25561 Pain in right knee: Secondary | ICD-10-CM | POA: Diagnosis not present

## 2015-12-07 DIAGNOSIS — R29898 Other symptoms and signs involving the musculoskeletal system: Secondary | ICD-10-CM

## 2015-12-07 NOTE — Therapy (Signed)
[place], [place], [address] Phone: [phone]   Fax:  [phone]  Physical Therapy Treatment  Patient Details  Name: Melissa Villanueva MRN: [id number] Date of Birth: 07/02/1946 Referring Provider: Maureen Ralphs  Encounter Date: 12/07/2015      PT End of Session - 12/07/15 1653    Visit Number 11   Number of Visits 16   Date for PT Re-Evaluation 12/21/14   Authorization Type Medicare G code 9th visit   Authorization Time Period 10/28/15-12/28/15   Authorization - Visit Number 11   Authorization - Number of Visits 19   PT Start Time [id number]   PT Stop Time 1556   PT Time Calculation (min) 40 min   Activity Tolerance Patient tolerated treatment well   Behavior During Therapy [facility] for tasks assessed/performed      Past Medical History  Diagnosis Date  . Arthritis   . GERD (gastroesophageal reflux disease)   . Pancytopenia ([place]) 2016  . PONV (postoperative nausea and vomiting)   . Stress incontinence   . Headache     Past Surgical History  Procedure Laterality Date  . Total hip arthroplasty Left   . Cholecystectomy    . Cesarean section    . Eye surgery  Sept 2015    Bilateral Glaucoma with surgery  . Total knee arthroplasty Right 10/12/2015    Procedure: RIGHT TOTAL KNEE ARTHROPLASTY;  Surgeon: Gaynelle Arabian, MD;  [facility]: [facility];  Service: Orthopedics;  Laterality: Right;    There were no vitals filed for this visit.  Visit Diagnosis:  Right knee pain  Stiffness of right knee  Weakness of right leg  Difficulty walking  Edema of right lower extremity      Subjective Assessment - 12/07/15 1517    Subjective Patient reports taht her shin and her hip are hurting much more than her knee today   Pertinent History Pt reports that she had R TKA on 11/14. Pt reports that she is still having a lot of pain and swelling in her knee. She is unable to walk long distances, and has been ambulating with a cane for  the past year and a half. She has one step to get into her home, which she is able to do if she does it slowly. Pt reports that her daughter was helping her for the past 2 weeks, but she has now moved back to her home, so the pt is on her own again. She reports that she is able to complete all ADLs independently, but it takes her a little more time.    Currently in Pain? Yes   Pain Score 4    Pain Location Leg   Pain Orientation Right                         [facility] Adult PT Treatment/Exercise - 12/07/15 0001    Knee/Hip Exercises: Stretches   Active Hamstring Stretch 3 reps;30 seconds   Active Hamstring Stretch Limitations standing 12" step   Knee: Self-Stretch to increase Flexion Right;10 seconds   Knee: Self-Stretch Limitations 12" 10 reps x 10 seconds   Gastroc Stretch 3 reps;30 seconds   Gastroc Stretch Limitations slant board   Knee/Hip Exercises: Standing   Heel Raises Both;1 set;10 reps   Heel Raises Limitations heel and toe    Forward Lunges 10 reps   Forward Lunges Limitations 4" step 1 UE only   Side Lunges Both;1 set;10  reps   Side Lunges Limitations 4 inch step    Rocker Board 2 minutes   Rocker Board Limitations AP and lateral    Gait Training gait approx 143ft no device, cues for heel0-toe    Other Standing Knee Exercises 3D hip excursions 1x10   Knee/Hip Exercises: Supine   Quad Sets Right;10 reps   Quad Sets Limitations 3 second holds    Heel Slides 10 reps   Bridges Both;1 set;10 reps   Straight Leg Raises Both;1 set;15 reps   Manual Therapy   Manual Therapy Myofascial release   Soft tissue mobilization to decrease adhesions                PT Education - 12/07/15 1653    Education provided No          PT Short Term Goals - 11/24/15 1422    PT SHORT TERM GOAL #1   Title Pt will be independent with HEP.   Time 3   Period Weeks   Status Achieved   PT SHORT TERM GOAL #2   Title Improve R knee extension to -5 degrees or greater  to improve gait mechanics.   Time 3   Period Weeks   Status Achieved   PT SHORT TERM GOAL #3   Title Increase R knee flexion ROM to 105 degrees or greater to improve ability to ascend and descend stairs.    Time 3   Period Weeks   Status Achieved   PT SHORT TERM GOAL #4   Title Improve BLE strength evidenced by five time sit to stand time of 22 seconds or less.    Time 3   Period Weeks   Status On-going   PT SHORT TERM GOAL #5   Title Pt will complete TUG in 17 seconds or less with no AD to demonstrate decreased fall risk.    Time 3   Period Weeks   Status Achieved           PT Long Term Goals - 11/24/15 1423    PT LONG TERM GOAL #1   Title Improve R knee ROM to 0-120 degrees to restore normal gait mechanics and improve stair navigation.   Time 6   Period Weeks   Status On-going   PT LONG TERM GOAL #2   Title Increase RLE strength to 4+/5 or greater evidenced by five time sit to stand time of 15 seconds or less.    Time 6   Period Weeks   Status On-going   PT LONG TERM GOAL #3   Title Pt will complete TUG in 12 seconds or less with no AD to demonstrate reduced fall risk.   Time 6   Period Weeks   Status On-going   PT LONG TERM GOAL #4   Title Reduce edema in R knee to be within 2 cm of L knee to decrease difficulty with dressing.    Time 6   Period Weeks   Status Achieved   PT LONG TERM GOAL #5   Title Pt will ambulate 1,000 feet with no AD with equal weightbearing, equal step length, and <2/10 pain to allow her to complete grocery shopping without rest break.    Time 6   Period Weeks   Status Achieved               Plan - 12/07/15 1654    Clinical Impression Statement Continue wtih functional exercises and stretches today; also worked on functional gait pattern without  device. Finished session today with manual to quad to reduce tissue tightness and improve overall function. Improved pain at end of session.    Pt will benefit from skilled therapeutic  intervention in order to improve on the following deficits Abnormal gait;Decreased activity tolerance;Decreased balance;Decreased endurance;Decreased strength;Decreased range of motion;Difficulty walking;Hypomobility;Increased edema;Pain   Rehab Potential Good   PT Frequency 3x / week   PT Duration 4 weeks   PT Treatment/Interventions ADLs/Self Care Home Management;Cryotherapy;Gait training;Stair training;Functional mobility training;Therapeutic activities;Therapeutic exercise;Balance training;Neuromuscular re-education;Patient/family education;Manual techniques;Passive range of motion   PT Next Visit Plan Continue to work on gait, strength and balance.  Begin stairs on 7" when ready   PT Home Exercise Plan No changes   Consulted and Agree with Plan of Care Patient        Problem List Patient Active Problem List   Diagnosis Date Noted  . OA (osteoarthritis) of knee 10/12/2015  . Pancytopenia ([place]) 10/17/2014  . Rheumatoid arthritis ([place]) 10/17/2014    Deniece Ree PT, DPT [place] [address] [place], [place], [address] Phone: [phone]   Fax:  [phone]  Name: Melissa Villanueva MRN: [id number] Date of Birth: July 28, 1946

## 2015-12-09 ENCOUNTER — Ambulatory Visit (HOSPITAL_COMMUNITY): Payer: Medicare Other

## 2015-12-09 DIAGNOSIS — M25661 Stiffness of right knee, not elsewhere classified: Secondary | ICD-10-CM | POA: Diagnosis not present

## 2015-12-09 DIAGNOSIS — R262 Difficulty in walking, not elsewhere classified: Secondary | ICD-10-CM | POA: Diagnosis not present

## 2015-12-09 DIAGNOSIS — M25561 Pain in right knee: Secondary | ICD-10-CM | POA: Diagnosis not present

## 2015-12-09 DIAGNOSIS — R29898 Other symptoms and signs involving the musculoskeletal system: Secondary | ICD-10-CM | POA: Diagnosis not present

## 2015-12-09 DIAGNOSIS — R600 Localized edema: Secondary | ICD-10-CM | POA: Diagnosis not present

## 2015-12-09 NOTE — Therapy (Signed)
[place], [place], [address] Phone: [phone]   Fax:  [phone]  Physical Therapy Treatment  Patient Details  Name: Melissa Villanueva MRN: [id number] Date of Birth: 1946-02-22 Referring Provider: Maureen Ralphs  Encounter Date: 12/09/2015      PT End of Session - 12/09/15 1606    Visit Number 12   Number of Visits 16   Date for PT Re-Evaluation 12/21/14   Authorization Type Medicare G code 9th visit   Authorization Time Period 10/28/15-12/28/15   Authorization - Visit Number 12   Authorization - Number of Visits 19   PT Start Time 1603   PT Stop Time [id number]   PT Time Calculation (min) 55 min   Activity Tolerance Patient tolerated treatment well   Behavior During Therapy [facility] for tasks assessed/performed      Past Medical History  Diagnosis Date  . Arthritis   . GERD (gastroesophageal reflux disease)   . Pancytopenia ([place]) 2016  . PONV (postoperative nausea and vomiting)   . Stress incontinence   . Headache     Past Surgical History  Procedure Laterality Date  . Total hip arthroplasty Left   . Cholecystectomy    . Cesarean section    . Eye surgery  Sept 2015    Bilateral Glaucoma with surgery  . Total knee arthroplasty Right 10/12/2015    Procedure: RIGHT TOTAL KNEE ARTHROPLASTY;  Surgeon: Gaynelle Arabian, MD;  Location: [facility];  Service: Orthopedics;  Laterality: Right;    There were no vitals filed for this visit.  Visit Diagnosis:  Right knee pain  Stiffness of right knee  Weakness of right leg  Difficulty walking      Subjective Assessment - 12/09/15 1605    Subjective Pt reports she drove today, first day of driving.  No reports of pain.   Pertinent History Pt reports that she had R TKA on 11/14. Pt reports that she is still having a lot of pain and swelling in her knee. She is unable to walk long distances, and has been ambulating with a cane for the past year and a half. She has one step to  get into her home, which she is able to do if she does it slowly. Pt reports that her daughter was helping her for the past 2 weeks, but she has now moved back to her home, so the pt is on her own again. She reports that she is able to complete all ADLs independently, but it takes her a little more time.    Patient Stated Goals Return to PLOF, be able to go grocery shopping without pain   Currently in Pain? No/denies            [facility] Adult PT Treatment/Exercise - 12/09/15 0001    Knee/Hip Exercises: Stretches   Active Hamstring Stretch 3 reps;30 seconds   Active Hamstring Stretch Limitations standing 12" step   Knee: Self-Stretch to increase Flexion Right;10 seconds   Knee: Self-Stretch Limitations 12" 10 reps x 10 seconds   Gastroc Stretch 3 reps;30 seconds   Gastroc Stretch Limitations slant board   Knee/Hip Exercises: Aerobic   Recumbent Bike 8" seat 6 for ROM and reduce stiffness; end of session   Knee/Hip Exercises: Standing   Heel Raises Both;15 reps   Heel Raises Limitations heel and toe    Lateral Step Up Right;10 reps;Hand Hold: 1;Step Height: 4"   Forward Step Up 10 reps;Step Height: 4";Hand Hold: 1  Stairs 4" cueing to reduce UE pulling up stairs x 2RT   Rocker Board 2 minutes   Rocker Board Limitations AP and lateral    Gait Training 226 ft no AD, cueing to reduce circumduction and heel to toe pattern   Knee/Hip Exercises: Supine   Heel Slides 10 reps   Manual Therapy   Manual Therapy Myofascial release   Manual therapy comments Supine position with LE elevated end of session   Edema Management Retro massage with LE elevated for edema   Joint Mobilization Medial Tibial Rotation/ Lateral tibial rotation (knee at 60 degrees   Soft tissue mobilization to decrease adhesions and tightness on quadriceps             PT Short Term Goals - 11/24/15 1422    PT SHORT TERM GOAL #1   Title Pt will be independent with HEP.   Time 3   Period Weeks   Status Achieved    PT SHORT TERM GOAL #2   Title Improve R knee extension to -5 degrees or greater to improve gait mechanics.   Time 3   Period Weeks   Status Achieved   PT SHORT TERM GOAL #3   Title Increase R knee flexion ROM to 105 degrees or greater to improve ability to ascend and descend stairs.    Time 3   Period Weeks   Status Achieved   PT SHORT TERM GOAL #4   Title Improve BLE strength evidenced by five time sit to stand time of 22 seconds or less.    Time 3   Period Weeks   Status On-going   PT SHORT TERM GOAL #5   Title Pt will complete TUG in 17 seconds or less with no AD to demonstrate decreased fall risk.    Time 3   Period Weeks   Status Achieved           PT Long Term Goals - 11/24/15 1423    PT LONG TERM GOAL #1   Title Improve R knee ROM to 0-120 degrees to restore normal gait mechanics and improve stair navigation.   Time 6   Period Weeks   Status On-going   PT LONG TERM GOAL #2   Title Increase RLE strength to 4+/5 or greater evidenced by five time sit to stand time of 15 seconds or less.    Time 6   Period Weeks   Status On-going   PT LONG TERM GOAL #3   Title Pt will complete TUG in 12 seconds or less with no AD to demonstrate reduced fall risk.   Time 6   Period Weeks   Status On-going   PT LONG TERM GOAL #4   Title Reduce edema in R knee to be within 2 cm of L knee to decrease difficulty with dressing.    Time 6   Period Weeks   Status Achieved   PT LONG TERM GOAL #5   Title Pt will ambulate 1,000 feet with no AD with equal weightbearing, equal step length, and <2/10 pain to allow her to complete grocery shopping without rest break.    Time 6   Period Weeks   Status Achieved               Plan - 12/09/15 1655    Clinical Impression Statement Session focus on improving gait mechanics, AROM and functional strengthening.  Verbal cueing to reduce circumduction and improve heel to toe gait mechanics.  Pt stated she felt a  leg length discrepancy  following her Lt TKR 2009.  Measurements taken with Rt femor 18.5 in and Lt 17.5in.  Pt educated on benefits of slight heel lift on Lt LE and increasing gradually to improve gait mechanics.  Continued functional stretches to reduce knee stiffness and improve ROM, AROM 3-115 degrees folllowing stretches and ROM based activities.  Pt continues to show significant limitations with balance and functional activities including stairs due to weakness, verbal cueing to reduce HHA with stair training completed on 4in step height today.  Ended session with manual retro massage for edema control. myofascial release technqiues on incision to reduce adhesion and soft tissue mobilization technqiues to reduce tightness and pain on quadricpes.  Pt reported pain free at end of session.     PT Next Visit Plan F/U on heel support for Lt LE LLD.  Continue to work on gait, strength and balance.  Begin stairs on 7" when ready        Problem List Patient Active Problem List   Diagnosis Date Noted  . OA (osteoarthritis) of knee 10/12/2015  . Pancytopenia ([place]) 10/17/2014  . Rheumatoid arthritis [facility]) 10/17/2014   Ihor Austin, LPTA; [place]  Aldona Lento 12/09/2015, 6:36 PM  [place] [address] [place], [place], [address] Phone: [phone]   Fax:  [phone]  Name: Melissa Villanueva MRN: [id number] Date of Birth: January 19, 1946

## 2015-12-10 ENCOUNTER — Ambulatory Visit (HOSPITAL_COMMUNITY): Payer: Medicare Other | Admitting: Physical Therapy

## 2015-12-10 DIAGNOSIS — R262 Difficulty in walking, not elsewhere classified: Secondary | ICD-10-CM

## 2015-12-10 DIAGNOSIS — R600 Localized edema: Secondary | ICD-10-CM | POA: Diagnosis not present

## 2015-12-10 DIAGNOSIS — M25661 Stiffness of right knee, not elsewhere classified: Secondary | ICD-10-CM | POA: Diagnosis not present

## 2015-12-10 DIAGNOSIS — R29898 Other symptoms and signs involving the musculoskeletal system: Secondary | ICD-10-CM | POA: Diagnosis not present

## 2015-12-10 DIAGNOSIS — M25561 Pain in right knee: Secondary | ICD-10-CM

## 2015-12-10 NOTE — Therapy (Signed)
[place], [place], [address] Phone: [phone]   Fax:  [phone]  Physical Therapy Treatment  Patient Details  Name: Brady Armond MRN: [id number] Date of Birth: 1946-10-25 Referring Provider: Maureen Ralphs  Encounter Date: 12/10/2015      PT End of Session - 12/10/15 1646    Visit Number 13   Number of Visits 16   Date for PT Re-Evaluation 12/21/14   Authorization Type Medicare G code 9th visit   Authorization Time Period 10/28/15-12/28/15   Authorization - Visit Number 13   Authorization - Number of Visits 19   PT Start Time [id number]   PT Stop Time 1655   PT Time Calculation (min) 51 min   Activity Tolerance Patient tolerated treatment well   Behavior During Therapy [facility] for tasks assessed/performed      Past Medical History  Diagnosis Date  . Arthritis   . GERD (gastroesophageal reflux disease)   . Pancytopenia ([place]) 2016  . PONV (postoperative nausea and vomiting)   . Stress incontinence   . Headache     Past Surgical History  Procedure Laterality Date  . Total hip arthroplasty Left   . Cholecystectomy    . Cesarean section    . Eye surgery  Sept 2015    Bilateral Glaucoma with surgery  . Total knee arthroplasty Right 10/12/2015    Procedure: RIGHT TOTAL KNEE ARTHROPLASTY;  Surgeon: Gaynelle Arabian, MD;  Location: [facility];  Service: Orthopedics;  Laterality: Right;    There were no vitals filed for this visit.  Visit Diagnosis:  Right knee pain  Stiffness of right knee  Weakness of right leg  Edema of right lower extremity  Difficulty walking      Subjective Assessment - 12/10/15 1605    Subjective PT states she is hurting today.  States 5/10 when she first got up and took meds to bring it down to 3/10.    Currently in Pain? Yes   Pain Score 3    Pain Location Knee   Pain Orientation Right;Posterior                         [facility] Adult PT Treatment/Exercise - 12/10/15  1607    Knee/Hip Exercises: Stretches   Active Hamstring Stretch 3 reps;30 seconds   Active Hamstring Stretch Limitations standing 12" step   Knee: Self-Stretch to increase Flexion Right;10 seconds   Knee: Self-Stretch Limitations 12" 10 reps x 10 seconds   Gastroc Stretch 3 reps;30 seconds   Gastroc Stretch Limitations slant board   Knee/Hip Exercises: Aerobic   Recumbent Bike 8" seat 6 for ROM and reduce stiffness; end of session   Knee/Hip Exercises: Standing   Heel Raises Both;15 reps   Heel Raises Limitations heel and toe    Forward Lunges 10 reps   Forward Lunges Limitations 4" step 1 UE only   Side Lunges Both;1 set;10 reps   Side Lunges Limitations 4 inch step    Lateral Step Up Right;10 reps;Hand Hold: 1;Step Height: 4"   Forward Step Up 10 reps;Step Height: 4";Hand Hold: 1   Gait Training 226 ft no AD, cueing to reduce circumduction and heel to toe pattern   Manual Therapy   Manual Therapy Myofascial release   Manual therapy comments Supine position with LE elevated end of session   Edema Management Retro massage with LE elevated for edema   Joint Mobilization Medial Tibial Rotation/ Lateral tibial rotation (  knee at 60 degrees   Soft tissue mobilization to decrease adhesions and tightness on quadriceps                  PT Short Term Goals - 11/24/15 1422    PT SHORT TERM GOAL #1   Title Pt will be independent with HEP.   Time 3   Period Weeks   Status Achieved   PT SHORT TERM GOAL #2   Title Improve R knee extension to -5 degrees or greater to improve gait mechanics.   Time 3   Period Weeks   Status Achieved   PT SHORT TERM GOAL #3   Title Increase R knee flexion ROM to 105 degrees or greater to improve ability to ascend and descend stairs.    Time 3   Period Weeks   Status Achieved   PT SHORT TERM GOAL #4   Title Improve BLE strength evidenced by five time sit to stand time of 22 seconds or less.    Time 3   Period Weeks   Status On-going   PT  SHORT TERM GOAL #5   Title Pt will complete TUG in 17 seconds or less with no AD to demonstrate decreased fall risk.    Time 3   Period Weeks   Status Achieved           PT Long Term Goals - 11/24/15 1423    PT LONG TERM GOAL #1   Title Improve R knee ROM to 0-120 degrees to restore normal gait mechanics and improve stair navigation.   Time 6   Period Weeks   Status On-going   PT LONG TERM GOAL #2   Title Increase RLE strength to 4+/5 or greater evidenced by five time sit to stand time of 15 seconds or less.    Time 6   Period Weeks   Status On-going   PT LONG TERM GOAL #3   Title Pt will complete TUG in 12 seconds or less with no AD to demonstrate reduced fall risk.   Time 6   Period Weeks   Status On-going   PT LONG TERM GOAL #4   Title Reduce edema in R knee to be within 2 cm of L knee to decrease difficulty with dressing.    Time 6   Period Weeks   Status Achieved   PT LONG TERM GOAL #5   Title Pt will ambulate 1,000 feet with no AD with equal weightbearing, equal step length, and <2/10 pain to allow her to complete grocery shopping without rest break.    Time 6   Period Weeks   Status Achieved               Plan - 12/10/15 1646    Clinical Impression Statement Continued focus on improving gait mechanics, AROM and strength.  Pt with decreased circumduction of Rt LE with gait despite increased symptoms.  Pt is to look into getting a heel lift on Lt LE this weekend.  Most scar tissue and adhesions located directly over scar on Rt knee.  did not increase or progress actitieis today due to flaired up from yesterdays visit.    PT Next Visit Plan F/U on heel support for Lt LE LLD.  Continue to work on gait, strength and balance.  Begin stairs on 7" when ready.  Progress balance activities if pain decreased next session.        Problem List Patient Active Problem List   Diagnosis Date  Noted  . OA (osteoarthritis) of knee 10/12/2015  . Pancytopenia ([place])  10/17/2014  . Rheumatoid arthritis [facility]) 10/17/2014    Teena Irani, PTA/CLT [phone]  12/10/2015, 4:50 PM  [place] [address] [place], [place], [address] Phone: [phone]   Fax:  [phone]  Name: Melissa Villanueva MRN: [id number] Date of Birth: September 17, 1946

## 2015-12-14 ENCOUNTER — Ambulatory Visit (HOSPITAL_COMMUNITY): Payer: Medicare Other | Admitting: Physical Therapy

## 2015-12-14 DIAGNOSIS — R29898 Other symptoms and signs involving the musculoskeletal system: Secondary | ICD-10-CM | POA: Diagnosis not present

## 2015-12-14 DIAGNOSIS — R600 Localized edema: Secondary | ICD-10-CM

## 2015-12-14 DIAGNOSIS — M25561 Pain in right knee: Secondary | ICD-10-CM

## 2015-12-14 DIAGNOSIS — M25661 Stiffness of right knee, not elsewhere classified: Secondary | ICD-10-CM

## 2015-12-14 DIAGNOSIS — R262 Difficulty in walking, not elsewhere classified: Secondary | ICD-10-CM | POA: Diagnosis not present

## 2015-12-14 NOTE — Therapy (Signed)
[place], [place], [address] Phone: [phone]   Fax:  [phone]  Physical Therapy Treatment  Patient Details  Name: Melissa Villanueva MRN: [id number] Date of Birth: January 17, 1946 Referring Provider: Maureen Ralphs  Encounter Date: 12/14/2015      PT End of Session - 12/14/15 1648    Visit Number 14   Number of Visits 16   Date for PT Re-Evaluation 12/21/14   Authorization Type Medicare G code 9th visit   Authorization Time Period 10/28/15-12/28/15   Authorization - Visit Number 14   Authorization - Number of Visits 19   PT Start Time 1602   PT Stop Time [id number]   PT Time Calculation (min) 43 min   Activity Tolerance Patient tolerated treatment well   Behavior During Therapy [facility] for tasks assessed/performed      Past Medical History  Diagnosis Date  . Arthritis   . GERD (gastroesophageal reflux disease)   . Pancytopenia ([place]) 2016  . PONV (postoperative nausea and vomiting)   . Stress incontinence   . Headache     Past Surgical History  Procedure Laterality Date  . Total hip arthroplasty Left   . Cholecystectomy    . Cesarean section    . Eye surgery  Sept 2015    Bilateral Glaucoma with surgery  . Total knee arthroplasty Right 10/12/2015    Procedure: RIGHT TOTAL KNEE ARTHROPLASTY;  Surgeon: Gaynelle Arabian, MD;  Location: [facility];  Service: Orthopedics;  Laterality: Right;    There were no vitals filed for this visit.  Visit Diagnosis:  Right knee pain  Stiffness of right knee  Weakness of right leg  Edema of right lower extremity  Difficulty walking      Subjective Assessment - 12/14/15 1603    Subjective Patient reports she is doing much better today, had a good weekend    Pertinent History Pt reports that she had R TKA on 11/14. Pt reports that she is still having a lot of pain and swelling in her knee. She is unable to walk long distances, and has been ambulating with a cane for the past year  and a half. She has one step to get into her home, which she is able to do if she does it slowly. Pt reports that her daughter was helping her for the past 2 weeks, but she has now moved back to her home, so the pt is on her own again. She reports that she is able to complete all ADLs independently, but it takes her a little more time.    Currently in Pain? No/denies            [facility] PT Assessment - 12/14/15 0001    AROM   Right Knee Extension 3   Right Knee Flexion 113   Strength   Right Hip Flexion 3+/5   Right Hip Extension 3/5   Right Hip ABduction 3+/5   Left Hip Flexion 3+/5   Left Hip Extension 3/5   Left Hip ABduction 4+/5   Right Knee Flexion 4+/5   Right Knee Extension 4+/5   Left Knee Flexion 4+/5   Left Knee Extension 5/5                     [facility] Adult PT Treatment/Exercise - 12/14/15 0001    Knee/Hip Exercises: Stretches   Active Hamstring Stretch 3 reps;30 seconds   Active Hamstring Stretch Limitations standing 12" step   Knee: Self-Stretch  to increase Flexion Right;10 seconds   Knee: Self-Stretch Limitations 12" 10 reps x 10 seconds   Gastroc Stretch 3 reps;30 seconds   Gastroc Stretch Limitations slant board   Knee/Hip Exercises: Standing   Heel Raises Both;15 reps   Heel Raises Limitations heel and toe    Forward Lunges 1 set;15 reps;Right   Forward Lunges Limitations 4 inch box    Side Lunges 1 set;15 reps;Right   Side Lunges Limitations 4 inch box    Forward Step Up Right;1 set;15 reps;Hand Hold: 1   Forward Step Up Limitations 4 inch box    Rocker Board 2 minutes   Rocker Board Limitations AP and lateral    Other Standing Knee Exercises 3D hip excursions 1x10             Balance Exercises - 12/14/15 1609    Balance Exercises: Standing   Standing Eyes Closed Narrow base of support (BOS);3 reps;20 secs;Other (comment)  eyes closed    Tandem Stance Eyes closed;Foam/compliant surface;3 reps;15 secs           PT Education -  12/14/15 1648    Education provided Yes   Education Details re-assess next session   Person(s) Educated Patient   Methods Explanation   Comprehension Verbalized understanding          PT Short Term Goals - 11/24/15 1422    PT SHORT TERM GOAL #1   Title Pt will be independent with HEP.   Time 3   Period Weeks   Status Achieved   PT SHORT TERM GOAL #2   Title Improve R knee extension to -5 degrees or greater to improve gait mechanics.   Time 3   Period Weeks   Status Achieved   PT SHORT TERM GOAL #3   Title Increase R knee flexion ROM to 105 degrees or greater to improve ability to ascend and descend stairs.    Time 3   Period Weeks   Status Achieved   PT SHORT TERM GOAL #4   Title Improve BLE strength evidenced by five time sit to stand time of 22 seconds or less.    Time 3   Period Weeks   Status On-going   PT SHORT TERM GOAL #5   Title Pt will complete TUG in 17 seconds or less with no AD to demonstrate decreased fall risk.    Time 3   Period Weeks   Status Achieved           PT Long Term Goals - 11/24/15 1423    PT LONG TERM GOAL #1   Title Improve R knee ROM to 0-120 degrees to restore normal gait mechanics and improve stair navigation.   Time 6   Period Weeks   Status On-going   PT LONG TERM GOAL #2   Title Increase RLE strength to 4+/5 or greater evidenced by five time sit to stand time of 15 seconds or less.    Time 6   Period Weeks   Status On-going   PT LONG TERM GOAL #3   Title Pt will complete TUG in 12 seconds or less with no AD to demonstrate reduced fall risk.   Time 6   Period Weeks   Status On-going   PT LONG TERM GOAL #4   Title Reduce edema in R knee to be within 2 cm of L knee to decrease difficulty with dressing.    Time 6   Period Weeks   Status Achieved  PT LONG TERM GOAL #5   Title Pt will ambulate 1,000 feet with no AD with equal weightbearing, equal step length, and <2/10 pain to allow her to complete grocery shopping without  rest break.    Time 6   Period Weeks   Status Achieved               Plan - 12/14/15 1649    Clinical Impression Statement Mini-reassess performed today due to upcoming MD appointment, which patient reported mid session. Patient does show improvements in ROM however does continue to lack some motion and is especially lacking strength in key proximal muscle groups. Otherwise focused on functional strengthening today with cues needed for correct performance of tasks today, especially during step ups during which patient shows tendency to use UE musculature to assist in compensating for weakness in lower extremity. Re-assess next session.    Pt will benefit from skilled therapeutic intervention in order to improve on the following deficits Abnormal gait;Decreased activity tolerance;Decreased balance;Decreased endurance;Decreased strength;Decreased range of motion;Difficulty walking;Hypomobility;Increased edema;Pain   Rehab Potential Good   PT Frequency 3x / week   PT Duration 4 weeks   PT Treatment/Interventions ADLs/Self Care Home Management;Cryotherapy;Gait training;Stair training;Functional mobility training;Therapeutic activities;Therapeutic exercise;Balance training;Neuromuscular re-education;Patient/family education;Manual techniques;Passive range of motion   PT Next Visit Plan F/U on heel support. Full Re-assess.    PT Home Exercise Plan No changes   Consulted and Agree with Plan of Care Patient        Problem List Patient Active Problem List   Diagnosis Date Noted  . OA (osteoarthritis) of knee 10/12/2015  . Pancytopenia ([place]) 10/17/2014  . Rheumatoid arthritis ([place]) 10/17/2014     Deniece Ree PT, DPT [place] [address] [place], [place], [address] Phone: [phone]   Fax:  [phone]  Name: Lahni Neifert MRN: [id number] Date of Birth: 11-03-1946

## 2015-12-15 DIAGNOSIS — Z471 Aftercare following joint replacement surgery: Secondary | ICD-10-CM | POA: Diagnosis not present

## 2015-12-15 DIAGNOSIS — Z96651 Presence of right artificial knee joint: Secondary | ICD-10-CM | POA: Diagnosis not present

## 2015-12-15 DIAGNOSIS — M25551 Pain in right hip: Secondary | ICD-10-CM | POA: Diagnosis not present

## 2015-12-15 DIAGNOSIS — M1611 Unilateral primary osteoarthritis, right hip: Secondary | ICD-10-CM | POA: Diagnosis not present

## 2015-12-16 ENCOUNTER — Ambulatory Visit (HOSPITAL_COMMUNITY): Payer: Medicare Other | Admitting: Physical Therapy

## 2015-12-16 DIAGNOSIS — M25561 Pain in right knee: Secondary | ICD-10-CM | POA: Diagnosis not present

## 2015-12-16 DIAGNOSIS — R29898 Other symptoms and signs involving the musculoskeletal system: Secondary | ICD-10-CM | POA: Diagnosis not present

## 2015-12-16 DIAGNOSIS — R600 Localized edema: Secondary | ICD-10-CM

## 2015-12-16 DIAGNOSIS — R262 Difficulty in walking, not elsewhere classified: Secondary | ICD-10-CM | POA: Diagnosis not present

## 2015-12-16 DIAGNOSIS — M25661 Stiffness of right knee, not elsewhere classified: Secondary | ICD-10-CM | POA: Diagnosis not present

## 2015-12-16 NOTE — Therapy (Signed)
[place], [place], [address] Phone: [phone]   Fax:  [phone]  Physical Therapy Treatment ( Full Re-Assessment)  Patient Details  Name: Melissa Villanueva MRN: [id number] Date of Birth: 1946/09/29 Referring Provider: Maureen Ralphs  Encounter Date: 12/16/2015      PT End of Session - 12/16/15 1750    Visit Number 15   Number of Visits 19   Date for PT Re-Evaluation 01/13/16   Authorization Type Medicare G code 9th visit   Authorization Time Period 10/28/15-12/28/15   Authorization - Visit Number 15   Authorization - Number of Visits 25   PT Start Time 1603   PT Stop Time 1643   PT Time Calculation (min) 40 min   Activity Tolerance Patient tolerated treatment well   Behavior During Therapy [facility] for tasks assessed/performed      Past Medical History  Diagnosis Date  . Arthritis   . GERD (gastroesophageal reflux disease)   . Pancytopenia ([place]) 2016  . PONV (postoperative nausea and vomiting)   . Stress incontinence   . Headache     Past Surgical History  Procedure Laterality Date  . Total hip arthroplasty Left   . Cholecystectomy    . Cesarean section    . Eye surgery  Sept 2015    Bilateral Glaucoma with surgery  . Total knee arthroplasty Right 10/12/2015    Procedure: RIGHT TOTAL KNEE ARTHROPLASTY;  Surgeon: Gaynelle Arabian, MD;  Location: [facility];  Service: Orthopedics;  Laterality: Right;    There were no vitals filed for this visit.  Visit Diagnosis:  Right knee pain - Plan: PT plan of care cert/re-cert  Stiffness of right knee - Plan: PT plan of care cert/re-cert  Weakness of right leg - Plan: PT plan of care cert/re-cert  Edema of right lower extremity - Plan: PT plan of care cert/re-cert  Difficulty walking - Plan: PT plan of care cert/re-cert      Subjective Assessment - 12/16/15 1603    Subjective Patient reports that she had a bit of trouble driving due to pain in her knee but was  ultimately able to get here OK; reports MD only wants 2-3 more weeks of PT    Pertinent History Pt reports that she had R TKA on 11/14. Pt reports that she is still having a lot of pain and swelling in her knee. She is unable to walk long distances, and has been ambulating with a cane for the past year and a half. She has one step to get into her home, which she is able to do if she does it slowly. Pt reports that her daughter was helping her for the past 2 weeks, but she has now moved back to her home, so the pt is on her own again. She reports that she is able to complete all ADLs independently, but it takes her a little more time.    Currently in Pain? No/denies            [facility] PT Assessment - 12/16/15 0001    AROM   Right Knee Extension 3   Right Knee Flexion 113   Strength   Right Hip Flexion 3+/5   Right Hip Extension 3/5   Right Hip ABduction 3+/5   Left Hip Flexion 3+/5   Left Hip Extension 3/5   Left Hip ABduction 4+/5   Right Knee Flexion 4+/5   Right Knee Extension 4+/5   Left Knee Flexion 4+/5  Left Knee Extension 5/5   Transfers   Five time sit to stand comments  15.5    Ambulation/Gait   Gait Comments TUG 12.5, 11.8, 11.02                     [facility] Adult PT Treatment/Exercise - 12/16/15 0001    Knee/Hip Exercises: Standing   Rocker Board 2 minutes   Rocker Board Limitations AP and lateral    Other Standing Knee Exercises step ups on 2 inch and 4 inch steps with minimal use of UEs and min guard for focus on activation of quad musculature    Other Standing Knee Exercises step downs from 2 inch step with minimal use of UEs for optimal use of quad muscle, min guard                 PT Education - 12/16/15 1749    Education provided Yes   Education Details plan of care moving forward, progress with skilled PT services, proper form for steps    Person(s) Educated Patient   Methods Explanation   Comprehension Verbalized understanding           PT Short Term Goals - 12/16/15 1608    PT SHORT TERM GOAL #1   Title Pt will be independent with HEP.   Time 3   Period Weeks   Status Achieved   PT SHORT TERM GOAL #2   Title Improve R knee extension to -5 degrees or greater to improve gait mechanics.   Time 3   Period Weeks   Status Achieved   PT SHORT TERM GOAL #3   Title Increase R knee flexion ROM to 105 degrees or greater to improve ability to ascend and descend stairs.    Time 3   Period Weeks   Status Achieved   PT SHORT TERM GOAL #4   Title Improve BLE strength evidenced by five time sit to stand time of 22 seconds or less.    Time 3   Period Weeks   Status Achieved   PT SHORT TERM GOAL #5   Title Pt will complete TUG in 17 seconds or less with no AD to demonstrate decreased fall risk.    Time 3   Period Weeks   Status Achieved           PT Long Term Goals - 12/16/15 1608    PT LONG TERM GOAL #1   Title Improve R knee ROM to 0-120 degrees to restore normal gait mechanics and improve stair navigation.   Time 6   Period Weeks   Status On-going   PT LONG TERM GOAL #2   Title Increase RLE strength to 4+/5 or greater evidenced by five time sit to stand time of 15 seconds or less.    Time 6   Period Weeks   Status Partially Met   PT LONG TERM GOAL #3   Title Pt will complete TUG in 12 seconds or less with no AD to demonstrate reduced fall risk.   Time 6   Period Weeks   Status Achieved   PT LONG TERM GOAL #4   Title Reduce edema in R knee to be within 2 cm of L knee to decrease difficulty with dressing.    Time 6   Period Weeks   Status Achieved   PT LONG TERM GOAL #5   Title Pt will ambulate 1,000 feet with no AD with equal weightbearing, equal step length, and <2/10  pain to allow her to complete grocery shopping without rest break.    Time 6   Period Weeks   Status Achieved               Plan - 25-Dec-2015 1751    Clinical Impression Statement Complete re-assess performed today with  strength/ROM measures carried over from last session. Patient has met majority of goals however does continue to demosntrate signficant apprehension with stairs (Stating "I just can't do it" when attempting to ascend/descend steps with surgical LE) and with her balance. Noted that patient tends to rely heavily on UEs for step ups and stair navigation, and so focused treatment session  today on step ups and step downs with very limited use of UEs (fingertip touch only) so as to focus on LE musculature. At end of session balance and LE muscle activation on stairs improved as was overall performance of stairs although patient does continue to require focused coaching on this Recommend 4 more skilled sessions for strong focus on stairs and balance, then DC to home program.    Pt will benefit from skilled therapeutic intervention in order to improve on the following deficits Abnormal gait;Decreased activity tolerance;Decreased balance;Decreased endurance;Decreased strength;Decreased range of motion;Difficulty walking;Hypomobility;Increased edema;Pain   Rehab Potential Good   PT Frequency Other (comment)  4 more sessions    PT Duration Other (comment)  4 more sessions    PT Treatment/Interventions ADLs/Self Care Home Management;Cryotherapy;Gait training;Stair training;Functional mobility training;Therapeutic activities;Therapeutic exercise;Balance training;Neuromuscular re-education;Patient/family education;Manual techniques;Passive range of motion   PT Next Visit Plan Focus on step ups, step downs, and stairs with miinimal to no UE use to reduce compensations and improve functional balance; balance work; DC in 4 sessions    PT Home Exercise Plan No changes   Consulted and Agree with Plan of Care Patient          G-Codes - 12/25/2015 1756    Functional Assessment Tool Used Based on skilled clincal assessment of strength, ROM, gait, stairs, balance    Functional Limitation Mobility: Walking and moving  around   Mobility: Walking and Moving Around Current Status (Y0511) At least 40 percent but less than 60 percent impaired, limited or restricted   Mobility: Walking and Moving Around Goal Status [phone]) At least 20 percent but less than 40 percent impaired, limited or restricted      Problem List Patient Active Problem List   Diagnosis Date Noted  . OA (osteoarthritis) of knee 10/12/2015  . Pancytopenia ([place]) 10/17/2014  . Rheumatoid arthritis ([place]) 10/17/2014    Physical Therapy Progress Note  Dates of Reporting Period: 11/24/15 to 2015/12/25  Objective Reports of Subjective Statement: see above   Objective Measurements: see above   Goal Update: see above   Plan: see above   Reason Skilled Services are Required: focus on step ups and downs with no UE use, balance, stairs, advanced HEP     Deniece Ree PT, DPT [place], [place], [address] Phone: [phone]   Fax:  [phone]  Name: Debbi Strandberg MRN: [id number] Date of Birth: [birth year]

## 2015-12-23 ENCOUNTER — Ambulatory Visit (HOSPITAL_COMMUNITY): Payer: Medicare Other

## 2015-12-23 DIAGNOSIS — R29898 Other symptoms and signs involving the musculoskeletal system: Secondary | ICD-10-CM

## 2015-12-23 DIAGNOSIS — R262 Difficulty in walking, not elsewhere classified: Secondary | ICD-10-CM

## 2015-12-23 DIAGNOSIS — M25661 Stiffness of right knee, not elsewhere classified: Secondary | ICD-10-CM

## 2015-12-23 DIAGNOSIS — M25561 Pain in right knee: Secondary | ICD-10-CM | POA: Diagnosis not present

## 2015-12-23 DIAGNOSIS — R600 Localized edema: Secondary | ICD-10-CM

## 2015-12-23 NOTE — Therapy (Signed)
[place], [place], [address] Phone: [phone]   Fax:  [phone]  Physical Therapy Treatment  Patient Details  Name: Melissa Villanueva MRN: [id number] Date of Birth: [birth year] Referring Provider: Maureen Ralphs  Encounter Date: 12/23/2015      PT End of Session - 12/23/15 1304    Visit Number 16   Number of Visits 19   Date for PT Re-Evaluation 01/13/16   Authorization Type Medicare G code 9th visit   Authorization Time Period 10/28/15-12/28/15   Authorization - Visit Number 16   Authorization - Number of Visits 25   PT Start Time 1302   PT Stop Time 1350   PT Time Calculation (min) 48 min   Activity Tolerance Patient tolerated treatment well   Behavior During Therapy [facility] for tasks assessed/performed      Past Medical History  Diagnosis Date  . Arthritis   . GERD (gastroesophageal reflux disease)   . Pancytopenia ([place]) 2016  . PONV (postoperative nausea and vomiting)   . Stress incontinence   . Headache     Past Surgical History  Procedure Laterality Date  . Total hip arthroplasty Left   . Cholecystectomy    . Cesarean section    . Eye surgery  Sept 2015    Bilateral Glaucoma with surgery  . Total knee arthroplasty Right 10/12/2015    Procedure: RIGHT TOTAL KNEE ARTHROPLASTY;  Surgeon: Gaynelle Arabian, MD;  Location: [facility];  Service: Orthopedics;  Laterality: Right;    There were no vitals filed for this visit.  Visit Diagnosis:  Right knee pain  Stiffness of right knee  Weakness of right leg  Edema of right lower extremity  Difficulty walking      Subjective Assessment - 12/23/15 1258    Subjective No reports of pain today, continues to have difficulty with balance and stairs.   Pertinent History Pt reports that she had R TKA on 11/14. Pt reports that she is still having a lot of pain and swelling in her knee. She is unable to walk long distances, and has been ambulating with a cane for  the past year and a half. She has one step to get into her home, which she is able to do if she does it slowly. Pt reports that her daughter was helping her for the past 2 weeks, but she has now moved back to her home, so the pt is on her own again. She reports that she is able to complete all ADLs independently, but it takes her a little more time.    Patient Stated Goals Return to PLOF, be able to go grocery shopping without pain   Currently in Pain? No/denies            [facility] Adult PT Treatment/Exercise - 12/23/15 0001    Knee/Hip Exercises: Standing   Forward Lunges Both;15 reps   Forward Lunges Limitations 4 inch box    Side Lunges 1 set;15 reps;Both   Side Lunges Limitations 4 inch box    Lateral Step Up Right;2 sets;10 reps;Hand Hold: 1;Hand Hold: 0;Step Height: 2";Step Height: 4"   Forward Step Up Right;2 sets;Hand Hold: 1;Hand Hold: 0;10 reps;Step Height: 4"   Step Down Right;2 sets;10 reps;Hand Hold: 1;Hand Hold: 0;Step Height: 2";Step Height: 4"   Step Down Limitations cueing to reduce rotation with step down   Rocker Board 2 minutes   Rocker Board Limitations AP and lateral    SLS Lt 5", Rt 3"  max of 3   Other Standing Knee Exercises tandem stance on airex 2x30"   Other Standing Knee Exercises Tandem gait 2RT            PT Short Term Goals - 12/16/15 1608    PT SHORT TERM GOAL #1   Title Pt will be independent with HEP.   Time 3   Period Weeks   Status Achieved   PT SHORT TERM GOAL #2   Title Improve R knee extension to -5 degrees or greater to improve gait mechanics.   Time 3   Period Weeks   Status Achieved   PT SHORT TERM GOAL #3   Title Increase R knee flexion ROM to 105 degrees or greater to improve ability to ascend and descend stairs.    Time 3   Period Weeks   Status Achieved   PT SHORT TERM GOAL #4   Title Improve BLE strength evidenced by five time sit to stand time of 22 seconds or less.    Time 3   Period Weeks   Status Achieved   PT SHORT  TERM GOAL #5   Title Pt will complete TUG in 17 seconds or less with no AD to demonstrate decreased fall risk.    Time 3   Period Weeks   Status Achieved           PT Long Term Goals - 12/16/15 1608    PT LONG TERM GOAL #1   Title Improve R knee ROM to 0-120 degrees to restore normal gait mechanics and improve stair navigation.   Time 6   Period Weeks   Status On-going   PT LONG TERM GOAL #2   Title Increase RLE strength to 4+/5 or greater evidenced by five time sit to stand time of 15 seconds or less.    Time 6   Period Weeks   Status Partially Met   PT LONG TERM GOAL #3   Title Pt will complete TUG in 12 seconds or less with no AD to demonstrate reduced fall risk.   Time 6   Period Weeks   Status Achieved   PT LONG TERM GOAL #4   Title Reduce edema in R knee to be within 2 cm of L knee to decrease difficulty with dressing.    Time 6   Period Weeks   Status Achieved   PT LONG TERM GOAL #5   Title Pt will ambulate 1,000 feet with no AD with equal weightbearing, equal step length, and <2/10 pain to allow her to complete grocery shopping without rest break.    Time 6   Period Weeks   Status Achieved               Plan - 12/23/15 1351    Clinical Impression Statement Session focus on improving functional strengthening especially stair training with least HHA for LE strenghtening.  Pt continues to demosntrate significant weakness with functional activities with multimodal cueing required to improve form with stairs, reduce HHA and reduce rotation due to compensation descending stairs though does improve with reps.  Balance continues to be a deficitis with 3" and 5" max SLS.  No reports of pain through session.  Pt given advanced HEP to progress quad strengthening.     PT Next Visit Plan Focus on step ups, step downs, and stairs with miinimal to no UE use to reduce compensations and improve functional balance; balance work; DC end of next week.   PT Home Exercise Plan  Given update this session.        Problem List Patient Active Problem List   Diagnosis Date Noted  . OA (osteoarthritis) of knee 10/12/2015  . Pancytopenia ([place]) 10/17/2014  . Rheumatoid arthritis [facility]) 10/17/2014   Ihor Austin, [place]; [place]  Aldona Lento 12/23/2015, 2:05 PM  [place] [address] [place], [place], [address] Phone: [phone]   Fax:  [phone]  Name: Melissa Villanueva MRN: [id number] Date of Birth: 1946-06-04

## 2015-12-23 NOTE — Patient Instructions (Signed)
FUNCTIONAL MOBILITY: Squat    Stance: shoulder-width on floor. Bend hips and knees. Keep back straight. Do not allow knees to bend past toes. Squeeze glutes and quads to stand. 10-20 reps per set, 1-2 sets per day.  Copyright  VHI. All rights reserved.   Single Leg Balance: Eyes Open    Stand on right leg with eyes open. Hold 30-60 seconds. 5 reps 1-2 times per day.  http://ggbe.exer.us/5   Copyright  VHI. All rights reserved.   Forward Lunge    Standing with feet shoulder width apart and stomach tight, step forward with left leg. Repeat 10-20 times per set. Do 1-2 sets per session. http://orth.exer.us/1147   Copyright  VHI. All rights reserved.

## 2015-12-28 ENCOUNTER — Ambulatory Visit (HOSPITAL_COMMUNITY): Payer: Medicare Other

## 2015-12-28 ENCOUNTER — Telehealth (HOSPITAL_COMMUNITY): Payer: Self-pay

## 2015-12-28 NOTE — Telephone Encounter (Signed)
No show, called and spoke to pt. who thought apt time was at 1:00 rather than 11:00 today.  Pt remineded next apt date and time as well as contact info.  [address], [place]; [place] [phone]'

## 2015-12-30 ENCOUNTER — Ambulatory Visit (HOSPITAL_COMMUNITY): Payer: Medicare Other | Attending: Orthopedic Surgery | Admitting: Physical Therapy

## 2015-12-30 DIAGNOSIS — R29898 Other symptoms and signs involving the musculoskeletal system: Secondary | ICD-10-CM | POA: Diagnosis not present

## 2015-12-30 DIAGNOSIS — R600 Localized edema: Secondary | ICD-10-CM

## 2015-12-30 DIAGNOSIS — M25561 Pain in right knee: Secondary | ICD-10-CM | POA: Diagnosis not present

## 2015-12-30 DIAGNOSIS — R262 Difficulty in walking, not elsewhere classified: Secondary | ICD-10-CM | POA: Diagnosis not present

## 2015-12-30 DIAGNOSIS — M25661 Stiffness of right knee, not elsewhere classified: Secondary | ICD-10-CM | POA: Diagnosis not present

## 2015-12-30 NOTE — Therapy (Signed)
[facility] [address] [place], [place], [address] Phone: [phone]   Fax:  [phone]  Physical Therapy Treatment (Discharge)  Patient Details  Name: Melissa Villanueva MRN: [id number] Date of Birth: 10/27/1946 Referring Provider: Alusio  Encounter Date: 12/30/2015      PT End of Session - 12/30/15 1741    Visit Number 17   Number of Visits 17   Date for PT Re-Evaluation 01/13/16   Authorization Type Medicare G code 9th visit   Authorization Time Period 10/28/15-12/28/15   Authorization - Visit Number 17   Authorization - Number of Visits 25   PT Start Time 1433   PT Stop Time 1513   PT Time Calculation (min) 40 min   Activity Tolerance Patient tolerated treatment well   Behavior During Therapy WFL for tasks assessed/performed      Past Medical History  Diagnosis Date  . Arthritis   . GERD (gastroesophageal reflux disease)   . Pancytopenia (HCC) 2016  . PONV (postoperative nausea and vomiting)   . Stress incontinence   . Headache     Past Surgical History  Procedure Laterality Date  . Total hip arthroplasty Left   . Cholecystectomy    . Cesarean section    . Eye surgery  Sept 2015    Bilateral Glaucoma with surgery  . Total knee arthroplasty Right 10/12/2015    Procedure: RIGHT TOTAL KNEE ARTHROPLASTY;  Surgeon: Frank Aluisio, MD;  Location: [facility];  Service: Orthopedics;  Laterality: Right;    There were no vitals filed for this visit.  Visit Diagnosis:  Right knee pain  Stiffness of right knee  Weakness of right leg  Edema of right lower extremity  Difficulty walking      Subjective Assessment - 12/30/15 1444    Subjective Patient reports she is doing ok today, drove herself here but is having some hip pain today. Reports that she feels she is doing pretty well right now and is feeling better about her function in general.    Pertinent History Pt reports that she had R TKA on 11/14. Pt reports that she  is still having a lot of pain and swelling in her knee. She is unable to walk long distances, and has been ambulating with a cane for the past year and a half. She has one step to get into her home, which she is able to do if she does it slowly. Pt reports that her daughter was helping her for the past 2 weeks, but she has now moved back to her home, so the pt is on her own again. She reports that she is able to complete all ADLs independently, but it takes her a little more time.    How long can you sit comfortably? 2/1- no limits    How long can you stand comfortably? 2/1- still shifts weight to L leg with extended standing    How long can you walk comfortably? 2/1- 20-30 minutes    Patient Stated Goals Return to PLOF, be able to go grocery shopping without pain   Currently in Pain? Yes   Pain Score 4    Pain Location Hip   Pain Orientation Right   Pain Descriptors / Indicators Constant            [facility] PT Assessment - 12/30/15 0001    Observation/Other Assessments   Focus on Therapeutic Outcomes (FOTO)  56% limited    AROM   Right Knee Extension 4     Right Knee Flexion 116   Strength   Right Hip Flexion 3+/5   Right Hip ABduction 2+/5  R hip pain today limiting strength   Left Hip Flexion 3+/5   Left Hip ABduction 4/5   Right Knee Flexion 4-/5  hip pain    Right Knee Extension 4+/5   Left Knee Flexion 4/5   Left Knee Extension 4+/5   Transfers   Five time sit to stand comments  15.32   Ambulation/Gait   Gait Comments TUG 12.78                     [facility] Adult PT Treatment/Exercise - 12/30/15 0001    Knee/Hip Exercises: Stretches   Active Hamstring Stretch 3 reps;30 seconds   Active Hamstring Stretch Limitations standing 12" step   Knee: Self-Stretch to increase Flexion Right;10 seconds   Knee: Self-Stretch Limitations 12" 10 reps x 10 seconds   Gastroc Stretch 3 reps;30 seconds   Gastroc Stretch Limitations slant board                PT  Education - 12/30/15 1741    Education provided Yes   Education Details advanced HEP, DC today    Person(s) Educated Patient   Methods Explanation   Comprehension Verbalized understanding          PT Short Term Goals - 12/30/15 1501    PT SHORT TERM GOAL #1   Title Pt will be independent with HEP.   Time 3   Period Weeks   Status Achieved   PT SHORT TERM GOAL #2   Title Improve R knee extension to -5 degrees or greater to improve gait mechanics.   Time 3   Period Weeks   Status Achieved   PT SHORT TERM GOAL #3   Title Increase R knee flexion ROM to 105 degrees or greater to improve ability to ascend and descend stairs.    Time 3   Period Weeks   Status Achieved   PT SHORT TERM GOAL #4   Title Improve BLE strength evidenced by five time sit to stand time of 22 seconds or less.    Time 3   Period Weeks   Status Achieved   PT SHORT TERM GOAL #5   Title Pt will complete TUG in 17 seconds or less with no AD to demonstrate decreased fall risk.    Time 3   Period Weeks   Status Achieved           PT Long Term Goals - 12/30/15 1502    PT LONG TERM GOAL #1   Title Improve R knee ROM to 0-120 degrees to restore normal gait mechanics and improve stair navigation.   Baseline 2/1- 4-116   Time 6   Period Weeks   Status On-going   PT LONG TERM GOAL #2   Title Increase RLE strength to 4+/5 or greater evidenced by five time sit to stand time of 15 seconds or less.    Time 6   Period Weeks   Status Partially Met   PT LONG TERM GOAL #3   Title Pt will complete TUG in 12 seconds or less with no AD to demonstrate reduced fall risk.   Time 6   Period Weeks   Status Achieved   PT LONG TERM GOAL #4   Title Reduce edema in R knee to be within 2 cm of L knee to decrease difficulty with dressing.    Time 6     Period Weeks   Status Achieved   PT LONG TERM GOAL #5   Title Pt will ambulate 1,000 feet with no AD with equal weightbearing, equal step length, and <2/10 pain to allow  her to complete grocery shopping without rest break.    Time 6   Period Weeks   Status Achieved               Plan - 12/30/15 1742    Clinical Impression Statement Patient arrived today reporting that she is feeling better about her level of function, as well as about the two factors she was concerned about last re-assess (stairs and balance), and is OK with DC at this point. Performed re-assessment and did nto identify any functional measures or factors that have improved since time of last re-assess- patient continues to demosntrate functional weakness, balance impairment, and difficulty with stairs. She was also experiencing quite a bit of hip pain today which did appear to limit some of her functional performance during measures and gait. Pateint has beeen receiving updates to HEP as she has continued along, however ws given a couple more updates today. DC today.    Pt will benefit from skilled therapeutic intervention in order to improve on the following deficits Abnormal gait;Decreased activity tolerance;Decreased balance;Decreased endurance;Decreased strength;Decreased range of motion;Difficulty walking;Hypomobility;Increased edema;Pain   Rehab Potential Good   PT Next Visit Plan DC today    PT Home Exercise Plan Given update this session.   Consulted and Agree with Plan of Care Patient          G-Codes - 12/30/15 1745    Functional Assessment Tool Used Based on skilled clincal assessment of strength, ROM, gait, stairs, balance    Functional Limitation Mobility: Walking and moving around   Mobility: Walking and Moving Around Goal Status (G8979) At least 20 percent but less than 40 percent impaired, limited or restricted   Mobility: Walking and Moving Around Discharge Status (G8980) At least 40 percent but less than 60 percent impaired, limited or restricted      Problem List Patient Active Problem List   Diagnosis Date Noted  . OA (osteoarthritis) of knee 10/12/2015  .  Pancytopenia (HCC) 10/17/2014  . Rheumatoid arthritis (HCC) 10/17/2014    PHYSICAL THERAPY DISCHARGE SUMMARY  Visits from Start of Care: 17  Current functional level related to goals / functional outcomes: Patient reports she is feeling pretty good about her current level of function right now, feels better about balance and stairs. Considers that she's pretty much ready for DC now. Has not made significant functional improvement since last re-assess.    Remaining deficits: Ongoing functional weakness, unsteadiness, difficulty with stairs    Education / Equipment: Updated HEP Plan: Patient agrees to discharge.  Patient goals were partially met. Patient is being discharged due to being pleased with the current functional level.  ?????        Kristen Unger PT, DPT [phone]   [facility] [address] [place], [place], [address] Phone: [phone]   Fax:  [phone]  Name: Melissa Villanueva MRN: [id number] Date of Birth: 09/20/1946     

## 2015-12-30 NOTE — Patient Instructions (Signed)
   QUAD SET WITH TOWEL UNDER HEEL  While lying or sitting with a small towel roll under your ankle, tighten your top thigh muscle to press the back of your knee downward towards the ground.  Repeat 10-15 times, twice a day with 3 second hold each repetition.    TANDEM STANCE WITH SUPPORT  Stand in front of a chair, table or counter top for support. Then place the heel of one foot so that it is touching the toes of the other foot. Maintain your balance in this position.  Make sure that you are not placing your left hip across midline.  Hold for as long as you can, then switch your legs. Repeat 3 times each side, twice a day.

## 2016-01-27 DIAGNOSIS — F339 Major depressive disorder, recurrent, unspecified: Secondary | ICD-10-CM | POA: Diagnosis not present

## 2016-01-27 DIAGNOSIS — D709 Neutropenia, unspecified: Secondary | ICD-10-CM | POA: Diagnosis not present

## 2016-01-27 DIAGNOSIS — E785 Hyperlipidemia, unspecified: Secondary | ICD-10-CM | POA: Diagnosis not present

## 2016-01-27 DIAGNOSIS — N183 Chronic kidney disease, stage 3 (moderate): Secondary | ICD-10-CM | POA: Diagnosis not present

## 2016-02-06 IMAGING — MR MR KNEE*R* W/O CM
4 of 5 series · 19 of 40 positions shown · non-contrast
Comparison: None.

CLINICAL DATA: Right knee pain

EXAM:
MRI OF THE RIGHT KNEE WITHOUT CONTRAST
TECHNIQUE: Multiplanar, multisequence MR imaging of the knee was performed. No
intravenous contrast was administered.

[Series 4: PD fat-sat · axial · 4.0mm · 0.31mm/px · z∈[-85,+21]mm · 8 of 25 slices shown (1 of 3)]
[im 1/25]
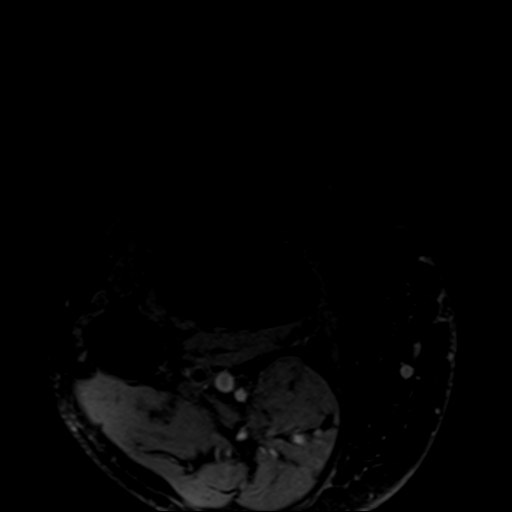
[im 4/25]
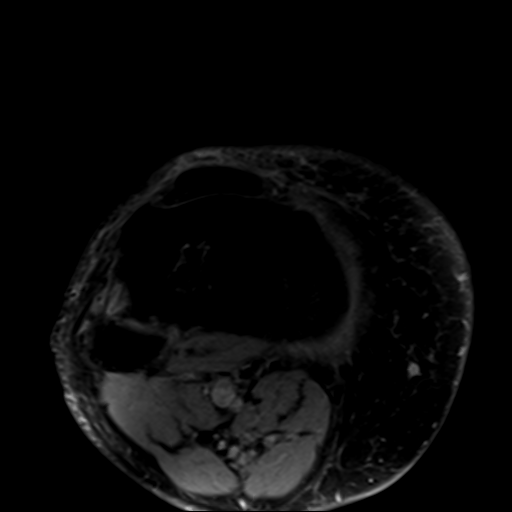
[im 7/25]
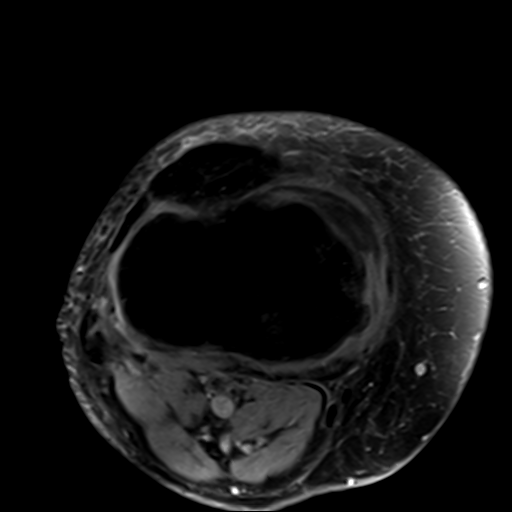
[im 11/25]
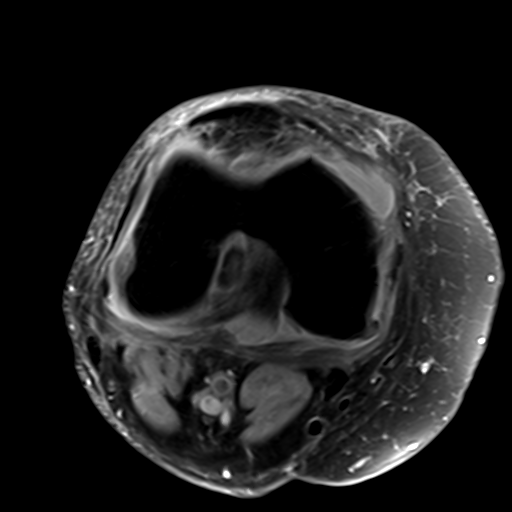
[im 14/25]
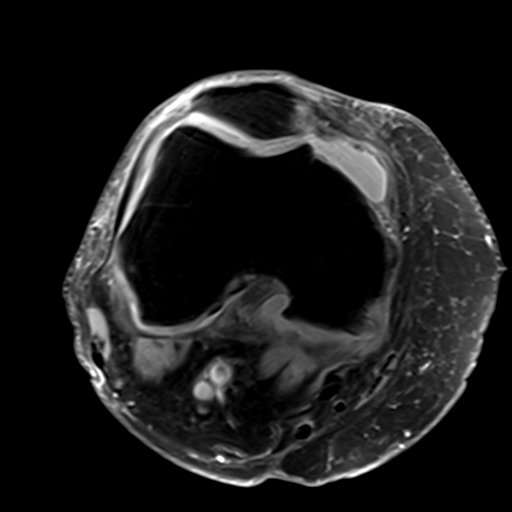
[im 18/25]
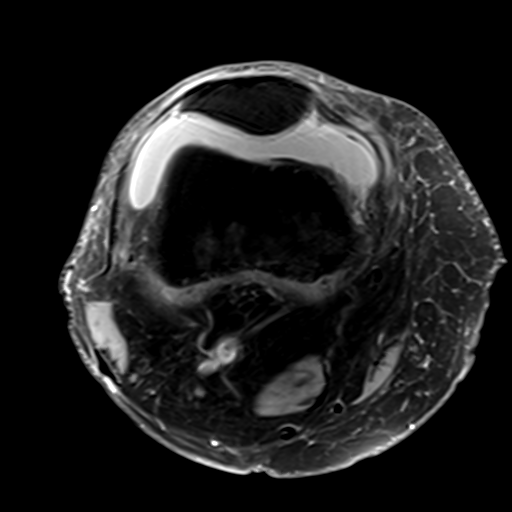
[im 21/25]
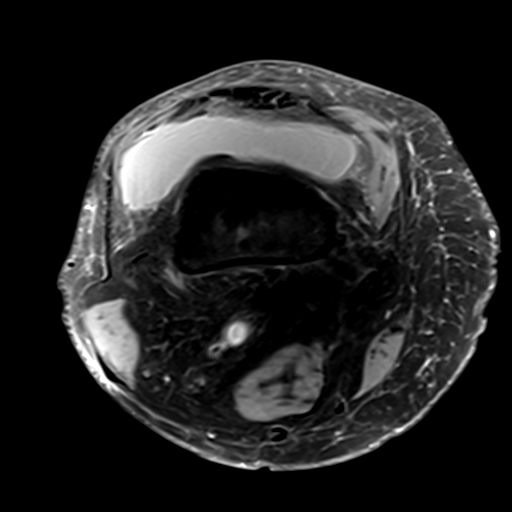
[im 25/25]
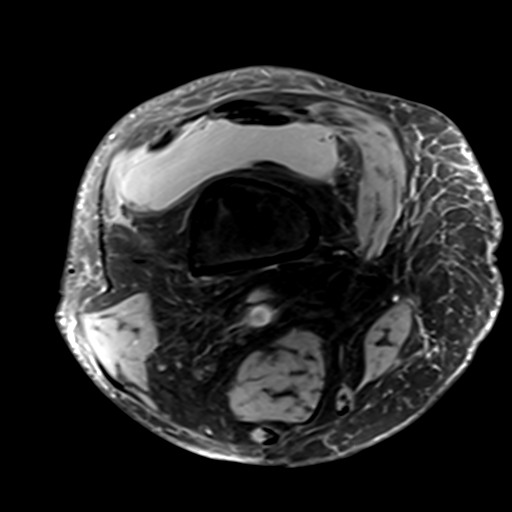

[Series 6: T2 fat-sat · coronal · 3.5mm · 0.31mm/px · 3 of 24 slices shown]
[im 4/24]
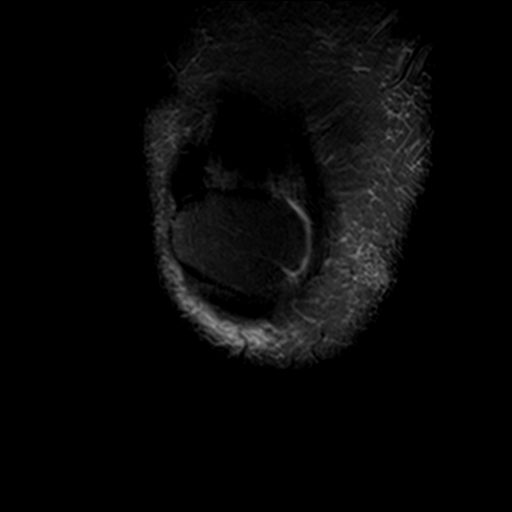
[im 14/24]
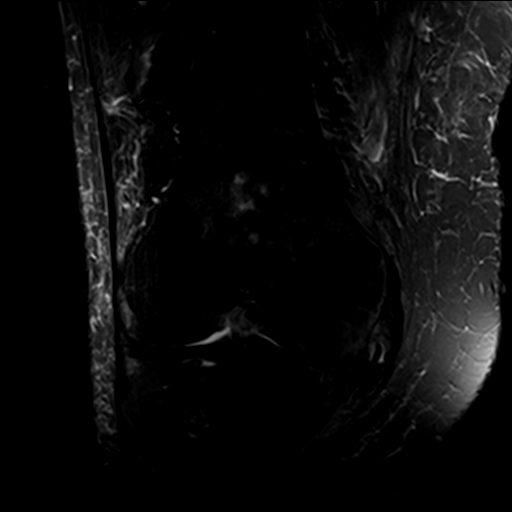
[im 20/24]
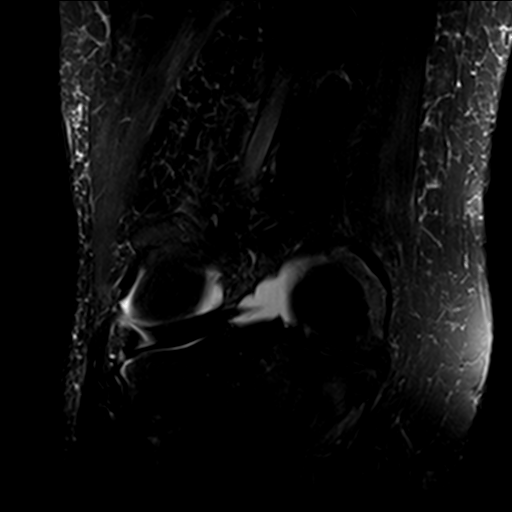

[Series 7: PD fat-sat · coronal · 3.5mm · 0.31mm/px · 5 of 24 slices shown (2 of 3)]
[im 1/24]
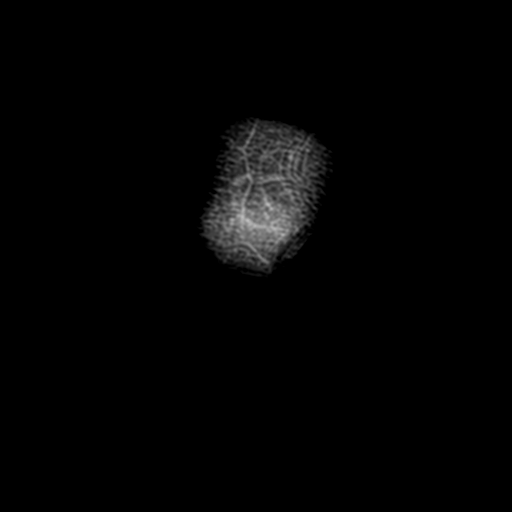
[im 4/24]
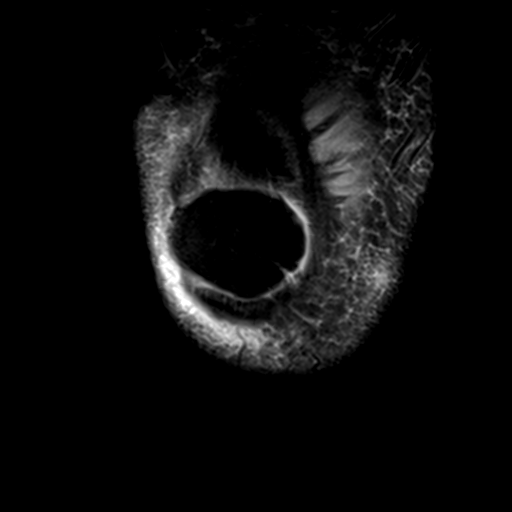
[im 7/24]
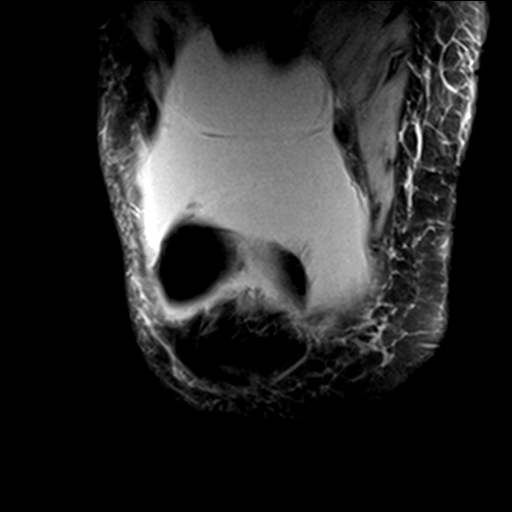
[im 14/24]
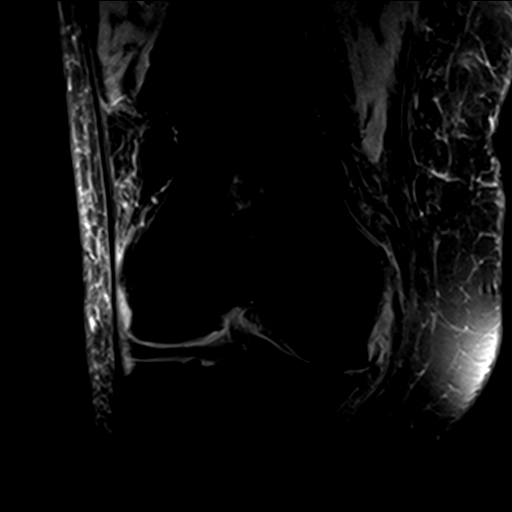
[im 20/24]
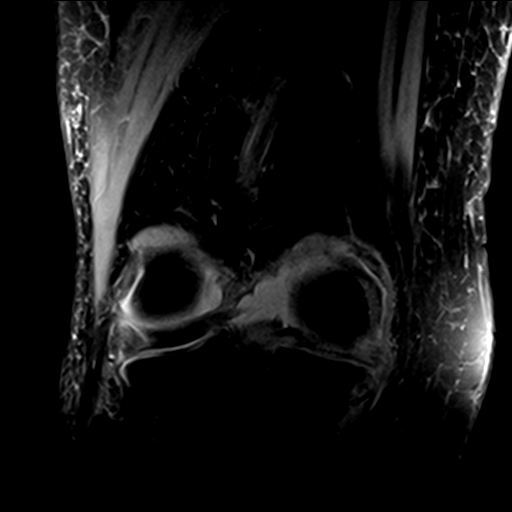

[Series 8: PD fat-sat · sagittal · 3.5mm · 0.31mm/px · 3 of 24 slices shown (3 of 3)]
[im 4/24]
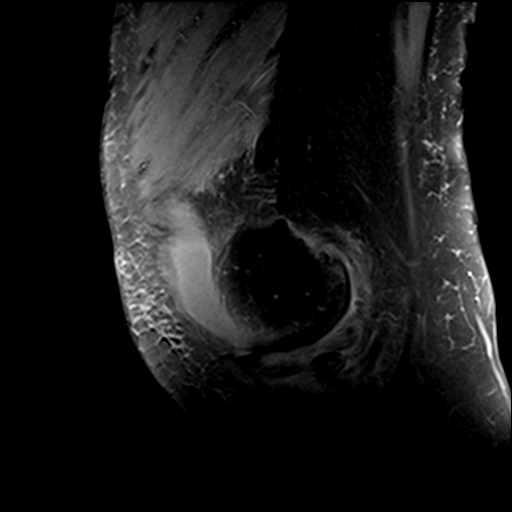
[im 14/24]
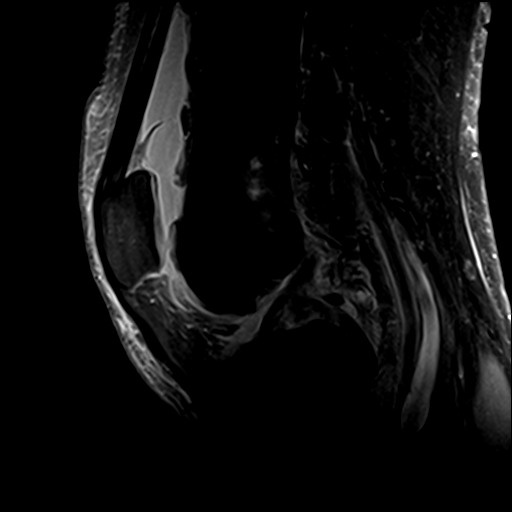
[im 20/24]
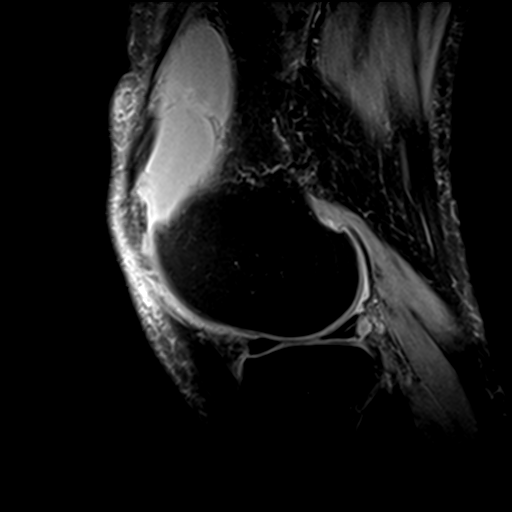

[19 of 40 positions shown; findings below may reference images not displayed]

FINDINGS: MENISCI

Medial meniscus:  Maceration of the body of the medial meniscus.

Lateral meniscus:  Intact.

LIGAMENTS

Cruciates:  Intact ACL and PCL.

Collaterals: Medial collateral ligament is intact. Lateral
collateral ligament complex is intact.

CARTILAGE

Patellofemoral: Partial thickness cartilage loss of the medial and
lateral patellar facets. Partial thickness cartilage loss of the
trochlear groove.

Medial: Full-thickness cartilage loss throughout the medial femoral
condyle and medial tibial plateau.

Lateral:  Cartilage irregularity without focal chondral defect.

Joint:  Large joint effusion.  Normal Hoffa's fat.

Popliteal Fossa:  Intact popliteus tendon.  No Baker cyst.

Extensor Mechanism:  Intact.

Bones: 16 x 19 mm lobulated mildly T2 hyperintense medial proximal
tibial metaphysis lesion with stippled low signal likely
representing a benign chondroid lesion such as an enchondroma.
IMPRESSION: 1. Maceration of the body of the medial meniscus
2. Tricompartmental cartilage abnormalities as described above most
severe in the medial femorotibial compartment.
3. Large joint effusion.

## 2016-03-31 DIAGNOSIS — M0589 Other rheumatoid arthritis with rheumatoid factor of multiple sites: Secondary | ICD-10-CM | POA: Diagnosis not present

## 2016-03-31 DIAGNOSIS — D61818 Other pancytopenia: Secondary | ICD-10-CM | POA: Diagnosis not present

## 2016-03-31 DIAGNOSIS — M8580 Other specified disorders of bone density and structure, unspecified site: Secondary | ICD-10-CM | POA: Diagnosis not present

## 2016-03-31 DIAGNOSIS — M150 Primary generalized (osteo)arthritis: Secondary | ICD-10-CM | POA: Diagnosis not present

## 2016-06-15 ENCOUNTER — Encounter (HOSPITAL_COMMUNITY): Payer: Medicare Other

## 2016-06-15 ENCOUNTER — Encounter (HOSPITAL_COMMUNITY): Payer: Medicare Other | Attending: Oncology | Admitting: Oncology

## 2016-06-15 ENCOUNTER — Ambulatory Visit (HOSPITAL_COMMUNITY): Payer: Self-pay | Admitting: Oncology

## 2016-06-15 ENCOUNTER — Encounter (HOSPITAL_COMMUNITY): Payer: Self-pay | Admitting: Oncology

## 2016-06-15 VITALS — BP 141/68 | HR 63 | Temp 97.9°F | Resp 16 | Wt 192.0 lb

## 2016-06-15 DIAGNOSIS — M069 Rheumatoid arthritis, unspecified: Secondary | ICD-10-CM | POA: Diagnosis not present

## 2016-06-15 DIAGNOSIS — Z96659 Presence of unspecified artificial knee joint: Secondary | ICD-10-CM | POA: Insufficient documentation

## 2016-06-15 DIAGNOSIS — Z96649 Presence of unspecified artificial hip joint: Secondary | ICD-10-CM | POA: Diagnosis not present

## 2016-06-15 DIAGNOSIS — Z87891 Personal history of nicotine dependence: Secondary | ICD-10-CM | POA: Diagnosis not present

## 2016-06-15 DIAGNOSIS — Z0000 Encounter for general adult medical examination without abnormal findings: Secondary | ICD-10-CM

## 2016-06-15 DIAGNOSIS — Z9049 Acquired absence of other specified parts of digestive tract: Secondary | ICD-10-CM | POA: Diagnosis not present

## 2016-06-15 DIAGNOSIS — D61818 Other pancytopenia: Secondary | ICD-10-CM | POA: Diagnosis not present

## 2016-06-15 LAB — COMPREHENSIVE METABOLIC PANEL
ALT: 11 U/L — ABNORMAL LOW (ref 14–54)
AST: 20 U/L (ref 15–41)
Albumin: 4.3 g/dL (ref 3.5–5.0)
Alkaline Phosphatase: 54 U/L (ref 38–126)
Anion gap: 4 — ABNORMAL LOW (ref 5–15)
BUN: 12 mg/dL (ref 6–20)
CHLORIDE: 103 mmol/L (ref 101–111)
CO2: 31 mmol/L (ref 22–32)
Calcium: 9.5 mg/dL (ref 8.9–10.3)
Creatinine, Ser: 0.85 mg/dL (ref 0.44–1.00)
GFR calc Af Amer: >60 mL/min (ref >60)
Glucose, Bld: 97 mg/dL (ref 65–99)
POTASSIUM: 4 mmol/L (ref 3.5–5.1)
SODIUM: 138 mmol/L (ref 135–145)
Total Bilirubin: 0.7 mg/dL (ref 0.3–1.2)
Total Protein: 7.2 g/dL (ref 6.5–8.1)

## 2016-06-15 LAB — CBC WITH DIFFERENTIAL/PLATELET
Basophils Absolute: 0 10*3/uL (ref 0.0–0.1)
Basophils Relative: 1 %
EOS ABS: 0 10*3/uL (ref 0.0–0.7)
Eosinophils Relative: 1 %
HEMATOCRIT: 39.1 % (ref 36.0–46.0)
HEMOGLOBIN: 12.9 g/dL (ref 12.0–15.0)
LYMPHS ABS: 1 10*3/uL (ref 0.7–4.0)
LYMPHS PCT: 18 %
MCH: 32.3 pg (ref 26.0–34.0)
MCHC: 33 g/dL (ref 30.0–36.0)
MCV: 97.8 fL (ref 78.0–100.0)
MONOS PCT: 6 %
Monocytes Absolute: 0.4 10*3/uL (ref 0.1–1.0)
NEUTROS ABS: 4.2 10*3/uL (ref 1.7–7.7)
NEUTROS PCT: 75 %
Platelets: 157 10*3/uL (ref 150–400)
RBC: 4 MIL/uL (ref 3.87–5.11)
RDW: 14.4 % (ref 11.5–15.5)
WBC: 5.6 10*3/uL (ref 4.0–10.5)

## 2016-06-15 NOTE — Patient Instructions (Addendum)
[place] at [facility] Discharge Instructions  RECOMMENDATIONS MADE BY THE CONSULTANT AND ANY TEST RESULTS WILL BE SENT TO YOUR REFERRING PHYSICIAN.  Exam done and seen today by [facility] today and in 6 months Get Mammogram Follow up with Dr.Beekman as directed Return to see the Doctor in 6 months Call the clinic for any concerns or questions.   Thank you for choosing [place] at [facility] to provide your oncology and hematology care.  To afford each patient quality time with our provider, please arrive at least 15 minutes before your scheduled appointment time.   Beginning January 23rd 2017 lab work for the [facility] will be done in the  Main lab at [facility] on 1st floor. If you have a lab appointment with the [facility] please come in thru the  Main Entrance and check in at the main information desk  You need to re-schedule your appointment should you arrive 10 or more minutes late.  We strive to give you quality time with our providers, and arriving late affects you and other patients whose appointments are after yours.  Also, if you no show three or more times for appointments you may be dismissed from the clinic at the providers discretion.     Again, thank you for choosing [facility].  Our hope is that these requests will decrease the amount of time that you wait before being seen by our physicians.       _____________________________________________________________  Should you have questions after your visit to [facility], please contact our office at [phone] between the hours of 8:30 a.m. and 4:30 p.m.  Voicemails left after 4:30 p.m. will not be returned until the following business day.  For prescription refill requests, have your pharmacy contact our office.         Resources For Cancer Patients and their Caregivers ? American Cancer Society: Can assist with  transportation, wigs, general needs, runs Look Good Feel Better.        7701500628 ? Cancer Care: Provides financial assistance, online support groups, medication/co-pay assistance.  1-800-813-HOPE 214 717 7948) ? [place] Assists [place] cancer patients and their families through emotional , educational and financial support.  (850)301-9387 ? [place] DSS Where to apply for food stamps, Medicaid and utility assistance. 801-639-3300 ? [facility]: Transportation to medical appointments. 4356876924 ? Social Security Administration: May apply for disability if have a Stage IV cancer. (540)474-8220 650-641-9413 ? [employer], Disability and Transit Services: Assists with nutrition, care and transit needs. [place] Support Programs: @10RELATIVEDAYS @ > Cancer Support Group  2nd Tuesday of the month 1pm-2pm, Journey Room  > Creative Journey  3rd Tuesday of the month 1130am-1pm, Journey Room  > Look Good Feel Better  1st Wednesday of the month 10am-12 noon, Journey Room (Call [place] to register [phone])

## 2016-06-15 NOTE — Assessment & Plan Note (Addendum)
H/O pancytopenia in the setting of RA on treatment with Plaquenil, previously treated with Plaquenil. Now with only mild thrombocytopenia since discontinuation of MTX. Her Rheumatologist is Dr. Amil Amen in [place].  She recently (2-3 months ago- April or May 2017) discontinued her Plaquenil due to cost- unbeknownst to Dr. Amil Amen.  Labs today: CBC diff, CMET.  I personally reviewed and went over laboratory results with the patient.  The results are noted within this dictation.  Labs in 6 months: CBC diff, CMET.    She reports to me today that in April/May 2017, she discontinued a number of her medications on her own including Plaquenil.  Dr. Amil Amen (Rheumatology) is unaware of this.  She has an appointment with Rheumatology next month and she is encouraged to follow-up as directed.  She denies any arthritic flares.  I personally reviewed and went over radiographic studies with the patient.  The results are noted within this dictation.  Last mammogram last performed in May 2016.  She is overdue for mammography.  She refuses to allow me to set this annual screening exam up for her.  "I got a letter and I will get it set-up myself."  Return in 6 months for follow-up. Based upon future labs, we can consider decreasing appointment frequency.

## 2016-06-15 NOTE — Progress Notes (Signed)
Melissa Skates, MD [place] [address]  Pancytopenia ([place]) - Plan: CBC with Differential, Comprehensive metabolic panel, CBC with Differential, Comprehensive metabolic panel  Preventative health care - Plan: MM SCREENING BREAST TOMO BILATERAL  CURRENT THERAPY: Observation  INTERVAL HISTORY: Melissa Villanueva 70 y.o. female returns for followup of h/o pancytopenia in the setting of RA on treatment with Plaquenil, previously treated with Plaquenil and MTX.   She reports that she feels well.  She denies any worsening or exacerbation of her arthritic pain.  She notes that she feels great.  She admits to discontinuing quite a few of her medications without medical provider guidance or recommendation.    Review of Systems  Constitutional: Negative.  Negative for fever, chills, weight loss and malaise/fatigue.  HENT: Negative.   Eyes: Negative.   Cardiovascular: Negative.   Gastrointestinal: Negative.   Genitourinary: Negative.   Musculoskeletal: Negative.  Negative for myalgias and joint pain.  Skin: Negative.   Neurological: Negative.  Negative for weakness.  Endo/Heme/Allergies: Negative.   Psychiatric/Behavioral: Negative.     Past Medical History  Diagnosis Date  . Arthritis   . GERD (gastroesophageal reflux disease)   . Pancytopenia ([place]) 2016  . PONV (postoperative nausea and vomiting)   . Stress incontinence   . Headache     Past Surgical History  Procedure Laterality Date  . Total hip arthroplasty Left   . Cholecystectomy    . Cesarean section    . Eye surgery  Sept 2015    Bilateral Glaucoma with surgery  . Total knee arthroplasty Right 10/12/2015    Procedure: RIGHT TOTAL KNEE ARTHROPLASTY;  Surgeon: Gaynelle Arabian, MD;  Location: [facility];  Service: Orthopedics;  Laterality: Right;    Family History  Problem Relation Age of Onset  . Cancer Maternal Aunt   . Cancer Paternal Aunt   . Cancer Brother     Social History    Social History  . Marital Status: Widowed    Spouse Name: N/A  . Number of Children: N/A  . Years of Education: N/A   Social History Main Topics  . Smoking status: Former Smoker    Quit date: 07/11/1986  . Smokeless tobacco: Never Used  . Alcohol Use: Yes     Comment: occasional   . Drug Use: No  . Sexual Activity: Not Asked   Other Topics Concern  . None   Social History Narrative     PHYSICAL EXAMINATION  ECOG PERFORMANCE STATUS: 1 - Symptomatic but completely ambulatory  Filed Vitals:   06/15/16 1018  BP: 141/68  Pulse: 63  Temp: 97.9 F (36.6 C)  Resp: 16    GENERAL:alert, no distress, well nourished, well developed, comfortable, cooperative, obese, smiling and unaccompanied SKIN: skin color, texture, turgor are normal, no rashes or significant lesions HEAD: Normocephalic, No masses, lesions, tenderness or abnormalities EYES: normal, EOMI, Conjunctiva are pink and non-injected EARS: External ears normal OROPHARYNX:lips, buccal mucosa, and tongue normal and mucous membranes are moist  NECK: supple, trachea midline LYMPH:  no palpable lymphadenopathy BREAST:not examined LUNGS: clear to auscultation  HEART: regular rate & rhythm ABDOMEN:abdomen soft and normal bowel sounds BACK: Back symmetric, no curvature. EXTREMITIES:less then 2 second capillary refill, no joint deformities, effusion, or inflammation, no skin discoloration, no cyanosis  NEURO: alert & oriented x 3 with fluent speech, no focal motor/sensory deficits, gait normal   LABORATORY DATA: CBC    Component Value Date/Time   WBC 5.6 06/15/2016  1053   RBC 4.00 06/15/2016 1053   RBC 3.75* 09/22/2014 1604   HGB 12.9 06/15/2016 1053   HCT 39.1 06/15/2016 1053   PLT 157 06/15/2016 1053   MCV 97.8 06/15/2016 1053   MCH 32.3 06/15/2016 1053   MCHC 33.0 06/15/2016 1053   RDW 14.4 06/15/2016 1053   LYMPHSABS 1.0 06/15/2016 1053   MONOABS 0.4 06/15/2016 1053   EOSABS 0.0 06/15/2016 1053    BASOSABS 0.0 06/15/2016 1053      Chemistry      Component Value Date/Time   NA 138 06/15/2016 1053   K 4.0 06/15/2016 1053   CL 103 06/15/2016 1053   CO2 31 06/15/2016 1053   BUN 12 06/15/2016 1053   CREATININE 0.85 06/15/2016 1053      Component Value Date/Time   CALCIUM 9.5 06/15/2016 1053   ALKPHOS 54 06/15/2016 1053   AST 20 06/15/2016 1053   ALT 11* 06/15/2016 1053   BILITOT 0.7 06/15/2016 1053        PENDING LABS:   RADIOGRAPHIC STUDIES:  No results found.   PATHOLOGY:    ASSESSMENT AND PLAN:  Pancytopenia ([place]) H/O pancytopenia in the setting of RA on treatment with Plaquenil, previously treated with Plaquenil. Now with only mild thrombocytopenia since discontinuation of MTX. Her Rheumatologist is Dr. Amil Amen in [place].  She recently (2-3 months ago- April or May 2017) discontinued her Plaquenil due to cost- unbeknownst to Dr. Amil Amen.  Labs today: CBC diff, CMET.  I personally reviewed and went over laboratory results with the patient.  The results are noted within this dictation.  Labs in 6 months: CBC diff, CMET.    She reports to me today that in April/May 2017, she discontinued a number of her medications on her own including Plaquenil.  Dr. Amil Amen (Rheumatology) is unaware of this.  She has an appointment with Rheumatology next month and she is encouraged to follow-up as directed.  She denies any arthritic flares.  I personally reviewed and went over radiographic studies with the patient.  The results are noted within this dictation.  Last mammogram last performed in May 2016.  She is overdue for mammography.  She refuses to allow me to set this annual screening exam up for her.  "I got a letter and I will get it set-up myself."  Return in 6 months for follow-up. Based upon future labs, we can consider decreasing appointment frequency.    ORDERS PLACED FOR THIS ENCOUNTER: Orders Placed This Encounter  Procedures  . MM SCREENING BREAST TOMO  BILATERAL  . CBC with Differential  . Comprehensive metabolic panel    MEDICATIONS PRESCRIBED THIS ENCOUNTER: No orders of the defined types were placed in this encounter.    THERAPY PLAN:  We will continue to monitor counts.  I am concerned that she discontinued her RA treatment on her own.  All questions were answered. The patient knows to call the clinic with any problems, questions or concerns. We can certainly see the patient much sooner if necessary.  Patient and plan discussed with Dr. Ancil Linsey and she is in agreement with the aforementioned.   This note is electronically signed by: Robynn Pane, PA-C 06/15/2016 10:00 PM

## 2016-06-28 DIAGNOSIS — Z96651 Presence of right artificial knee joint: Secondary | ICD-10-CM | POA: Diagnosis not present

## 2016-06-28 DIAGNOSIS — Z471 Aftercare following joint replacement surgery: Secondary | ICD-10-CM | POA: Diagnosis not present

## 2016-07-04 DIAGNOSIS — M0589 Other rheumatoid arthritis with rheumatoid factor of multiple sites: Secondary | ICD-10-CM | POA: Diagnosis not present

## 2016-07-04 DIAGNOSIS — D61818 Other pancytopenia: Secondary | ICD-10-CM | POA: Diagnosis not present

## 2016-07-04 DIAGNOSIS — M8580 Other specified disorders of bone density and structure, unspecified site: Secondary | ICD-10-CM | POA: Diagnosis not present

## 2016-07-04 DIAGNOSIS — Z79899 Other long term (current) drug therapy: Secondary | ICD-10-CM | POA: Diagnosis not present

## 2016-07-04 DIAGNOSIS — M150 Primary generalized (osteo)arthritis: Secondary | ICD-10-CM | POA: Diagnosis not present

## 2016-07-07 ENCOUNTER — Other Ambulatory Visit (HOSPITAL_COMMUNITY): Payer: Self-pay | Admitting: Hematology & Oncology

## 2016-07-07 DIAGNOSIS — Z1231 Encounter for screening mammogram for malignant neoplasm of breast: Secondary | ICD-10-CM

## 2016-07-14 ENCOUNTER — Ambulatory Visit (HOSPITAL_COMMUNITY)
Admission: RE | Admit: 2016-07-14 | Discharge: 2016-07-14 | Disposition: A | Discharge status: Home / Self Care | Payer: Medicare Other | Source: Ambulatory Visit | Attending: Hematology & Oncology | Admitting: Hematology & Oncology

## 2016-07-14 DIAGNOSIS — Z1231 Encounter for screening mammogram for malignant neoplasm of breast: Secondary | ICD-10-CM | POA: Insufficient documentation

## 2016-07-29 DIAGNOSIS — Z23 Encounter for immunization: Secondary | ICD-10-CM | POA: Diagnosis not present

## 2016-07-29 DIAGNOSIS — N390 Urinary tract infection, site not specified: Secondary | ICD-10-CM | POA: Diagnosis not present

## 2016-07-29 DIAGNOSIS — M8580 Other specified disorders of bone density and structure, unspecified site: Secondary | ICD-10-CM | POA: Diagnosis not present

## 2016-07-29 DIAGNOSIS — Z0000 Encounter for general adult medical examination without abnormal findings: Secondary | ICD-10-CM | POA: Diagnosis not present

## 2016-07-29 DIAGNOSIS — E559 Vitamin D deficiency, unspecified: Secondary | ICD-10-CM | POA: Diagnosis not present

## 2016-07-29 DIAGNOSIS — M859 Disorder of bone density and structure, unspecified: Secondary | ICD-10-CM | POA: Diagnosis not present

## 2016-07-29 DIAGNOSIS — E785 Hyperlipidemia, unspecified: Secondary | ICD-10-CM | POA: Diagnosis not present

## 2016-07-29 DIAGNOSIS — Z1389 Encounter for screening for other disorder: Secondary | ICD-10-CM | POA: Diagnosis not present

## 2016-08-02 DIAGNOSIS — K219 Gastro-esophageal reflux disease without esophagitis: Secondary | ICD-10-CM | POA: Diagnosis not present

## 2016-08-02 DIAGNOSIS — M069 Rheumatoid arthritis, unspecified: Secondary | ICD-10-CM | POA: Diagnosis not present

## 2016-08-02 DIAGNOSIS — R32 Unspecified urinary incontinence: Secondary | ICD-10-CM | POA: Diagnosis not present

## 2016-08-04 DIAGNOSIS — E785 Hyperlipidemia, unspecified: Secondary | ICD-10-CM | POA: Diagnosis not present

## 2016-08-04 DIAGNOSIS — N183 Chronic kidney disease, stage 3 (moderate): Secondary | ICD-10-CM | POA: Diagnosis not present

## 2016-08-04 DIAGNOSIS — D709 Neutropenia, unspecified: Secondary | ICD-10-CM | POA: Diagnosis not present

## 2016-08-04 DIAGNOSIS — F339 Major depressive disorder, recurrent, unspecified: Secondary | ICD-10-CM | POA: Diagnosis not present

## 2016-08-23 DIAGNOSIS — R42 Dizziness and giddiness: Secondary | ICD-10-CM | POA: Diagnosis not present

## 2016-09-05 DIAGNOSIS — M0589 Other rheumatoid arthritis with rheumatoid factor of multiple sites: Secondary | ICD-10-CM | POA: Diagnosis not present

## 2016-09-05 DIAGNOSIS — D61818 Other pancytopenia: Secondary | ICD-10-CM | POA: Diagnosis not present

## 2016-09-05 DIAGNOSIS — M150 Primary generalized (osteo)arthritis: Secondary | ICD-10-CM | POA: Diagnosis not present

## 2016-09-05 DIAGNOSIS — M8580 Other specified disorders of bone density and structure, unspecified site: Secondary | ICD-10-CM | POA: Diagnosis not present

## 2016-09-05 DIAGNOSIS — Z79899 Other long term (current) drug therapy: Secondary | ICD-10-CM | POA: Diagnosis not present

## 2016-09-20 ENCOUNTER — Encounter: Payer: Self-pay | Admitting: Neurology

## 2016-09-20 ENCOUNTER — Ambulatory Visit (INDEPENDENT_AMBULATORY_CARE_PROVIDER_SITE_OTHER): Payer: Medicare Other | Admitting: Neurology

## 2016-09-20 DIAGNOSIS — R42 Dizziness and giddiness: Secondary | ICD-10-CM

## 2016-09-20 DIAGNOSIS — M069 Rheumatoid arthritis, unspecified: Secondary | ICD-10-CM | POA: Diagnosis not present

## 2016-09-20 DIAGNOSIS — R269 Unspecified abnormalities of gait and mobility: Secondary | ICD-10-CM | POA: Diagnosis not present

## 2016-09-20 NOTE — Progress Notes (Signed)
PATIENT: Melissa Villanueva DOB: Dec 11, 1945  Chief Complaint  Patient presents with  . Dizziness    Orthostatic vitals:  Lying: 162/76, 73, Sitting: 171/79, 74, Standing: 172/85, 74.  She started having intermittent dizziness, with positional changes, about four weeks ago.  Marland Kitchen PCP    [name], MD     HISTORICAL  Melissa Villanueva is a 70 years old right-handed female, seen in refer by her primary care Dr. [name] for evaluation of sudden onset dizziness, initial evaluation was September 20 2016.  She had a past medical history of rheumatoid arthritis, acid reflux, she had a history of left hip replacement, right knee replacement, is a candidate for right hip replacement.  She has baseline gait abnormality, walking with a cane, still lives by herself independently.  She went to bed at her baseline August 22 2016, woke up next morning September 26 notice severe vertigo, nauseous, lasting for a few hours, she was evaluated by her primary care physician Dr. Maudie Mercury, is referred to our clinic for further evaluation.  Over the past 3 weeks, symptoms overall has much improved, but with sudden positional change she continued to experience transient dizziness, she complains of gradual onset slow mild hearing loss bilaterally, she noticed she has to turn up volume on her TV, she denies tinnitus.  REVIEW OF SYSTEMS: Full 14 system review of systems performed and notable only for easy brusing,  Swelling in legs, dizziness, memory loss, joints swelling, aching muscles.  ALLERGIES: Allergies  Allergen Reactions  . Leflunomide Itching and Rash    HOME MEDICATIONS: Current Outpatient Prescriptions  Medication Sig Dispense Refill  . alendronate (FOSAMAX) 70 MG tablet once a week.    . Calcium Citrate-Vitamin D (CALCIUM + D PO) Take 1,200 mg by mouth daily.    . famotidine (PEPCID) 40 MG tablet Take 40 mg by mouth daily.    . folic acid (FOLVITE) A999333 MCG tablet Take 400 mcg by mouth daily.    .  meclizine (ANTIVERT) 25 MG tablet as needed.    . naproxen (NAPROSYN) 500 MG tablet Take 500 mg by mouth 2 (two) times daily as needed.    . Probiotic Product (PROBIOTIC PO) Take 1 capsule by mouth daily.    . solifenacin (VESICARE) 5 MG tablet Take 5 mg by mouth daily.     No current facility-administered medications for this visit.     PAST MEDICAL HISTORY: Past Medical History:  Diagnosis Date  . Arthritis   . Dizziness   . GERD (gastroesophageal reflux disease)   . Headache   . Pancytopenia ([place]) 2016  . PONV (postoperative nausea and vomiting)   . Stress incontinence     PAST SURGICAL HISTORY: Past Surgical History:  Procedure Laterality Date  . CESAREAN SECTION    . CHOLECYSTECTOMY    . EYE SURGERY  Sept 2015   Bilateral Glaucoma with surgery  . TOTAL HIP ARTHROPLASTY Left   . TOTAL KNEE ARTHROPLASTY Right 10/12/2015   Procedure: RIGHT TOTAL KNEE ARTHROPLASTY;  Surgeon: Gaynelle Arabian, MD;  Location: [facility];  Service: Orthopedics;  Laterality: Right;    FAMILY HISTORY: Family History  Problem Relation Age of Onset  . Heart disease Mother   . Cirrhosis Father   . Heart attack Brother   . Cancer Maternal Aunt     Lung  . Cancer Paternal Aunt     Gastric  . Cancer Brother     SOCIAL HISTORY:  Social History   Social History  .  Marital status: Widowed    Spouse name: N/A  . Number of children: 5  . Years of education: HS   Occupational History  . Retired    Social History Main Topics  . Smoking status: Former Smoker    Quit date: 07/11/1986  . Smokeless tobacco: Never Used  . Alcohol use Yes     Comment: occasional   . Drug use: No  . Sexual activity: Not on file   Other Topics Concern  . Not on file   Social History Narrative   Lives at home alone.   Right-handed.   1 cup caffeine daily.     PHYSICAL EXAM   Vitals:   09/20/16 1035  BP: (!) 162/76  Pulse: 73  Weight: 192 lb 8 oz (87.3 kg)  Height: 5\' 1"  (1.549 m)    Not recorded       Body mass index is 36.37 kg/m.  PHYSICAL EXAMNIATION:  Gen: NAD, conversant, well nourised, obese, well groomed                     Cardiovascular: Regular rate rhythm, no peripheral edema, warm, nontender. Eyes: Conjunctivae clear without exudates or hemorrhage Neck: Supple, no carotid bruits. Pulmonary: Clear to auscultation bilaterally   NEUROLOGICAL EXAM:  MENTAL STATUS: Speech:    Speech is normal; fluent and spontaneous with normal comprehension.  Cognition:     Orientation to time, place and person     Normal recent and remote memory     Normal Attention span and concentration     Normal Language, naming, repeating,spontaneous speech     Fund of knowledge   CRANIAL NERVES: CN II: Visual fields are full to confrontation. Fundoscopic exam is normal with sharp discs and no vascular changes. Pupils are round equal and briskly reactive to light. CN III, IV, VI: extraocular movement are normal. No ptosis. CN V: Facial sensation is intact to pinprick in all 3 divisions bilaterally. Corneal responses are intact.  CN VII: Face is symmetric with normal eye closure and smile. CN VIII: Hearing is normal to rubbing fingers CN IX, X: Palate elevates symmetrically. Phonation is normal. CN XI: Head turning and shoulder shrug are intact CN XII: Tongue is midline with normal movements and no atrophy.  MOTOR: There is no pronator drift of out-stretched arms. Muscle bulk and tone are normal. Muscle strength is normal.  REFLEXES: Reflexes are 2+ and symmetric at the biceps, triceps, knees, and ankles. Plantar responses are flexor.  SENSORY: Intact to light touch, pinprick, positional sensation and vibratory sensation are intact in fingers and toes.  COORDINATION: Rapid alternating movements and fine finger movements are intact. There is no dysmetria on finger-to-nose and heel-knee-shin.    GAIT/STANCE: She needs push up to get up from seated position, antalgic cautious, unsteady  gait   Romberg is absent.  I performed Apley's maneuver, there was no significant vertigo, or nystagmus induced with right, or left ear dependent position.  DIAGNOSTIC DATA (LABS, IMAGING, TESTING) - I reviewed patient records, labs, notes, testing and imaging myself where available.   ASSESSMENT AND PLAN  Bunice Delahanty is a 70 y.o. female   acute onset of vertigo   need to rule out brainstem/cerebellar stroke   MRI of brain Aspirin 81 mg daily  Differentiation diagnosis also includes benign positional vertigo, I have advised her continue the positional maneuver at home, right ear dependent position first,    Marcial Pacas, M.D. Ph.D.  [facility] [phone] [address]rd  [address], [place], [place] [address] Ph: [phone] Fax: [phone]  CC: Referring Provider

## 2016-09-26 ENCOUNTER — Ambulatory Visit (HOSPITAL_COMMUNITY)
Admission: RE | Admit: 2016-09-26 | Discharge: 2016-09-26 | Disposition: A | Discharge status: Home / Self Care | Payer: Medicare Other | Source: Ambulatory Visit | Attending: Neurology | Admitting: Neurology

## 2016-09-26 DIAGNOSIS — I679 Cerebrovascular disease, unspecified: Secondary | ICD-10-CM | POA: Diagnosis not present

## 2016-09-26 DIAGNOSIS — R42 Dizziness and giddiness: Secondary | ICD-10-CM | POA: Insufficient documentation

## 2016-09-26 DIAGNOSIS — R269 Unspecified abnormalities of gait and mobility: Secondary | ICD-10-CM | POA: Insufficient documentation

## 2016-10-19 DIAGNOSIS — M1611 Unilateral primary osteoarthritis, right hip: Secondary | ICD-10-CM | POA: Diagnosis not present

## 2016-10-19 DIAGNOSIS — M25552 Pain in left hip: Secondary | ICD-10-CM | POA: Diagnosis not present

## 2016-11-01 ENCOUNTER — Encounter: Payer: Self-pay | Admitting: Neurology

## 2016-11-01 ENCOUNTER — Ambulatory Visit (INDEPENDENT_AMBULATORY_CARE_PROVIDER_SITE_OTHER): Payer: Medicare Other | Admitting: Neurology

## 2016-11-01 VITALS — BP 163/75 | HR 66 | Ht 61.0 in | Wt 190.2 lb

## 2016-11-01 DIAGNOSIS — M25512 Pain in left shoulder: Secondary | ICD-10-CM

## 2016-11-01 DIAGNOSIS — R269 Unspecified abnormalities of gait and mobility: Secondary | ICD-10-CM

## 2016-11-01 DIAGNOSIS — R42 Dizziness and giddiness: Secondary | ICD-10-CM

## 2016-11-01 DIAGNOSIS — G8929 Other chronic pain: Secondary | ICD-10-CM

## 2016-11-01 NOTE — Progress Notes (Signed)
PATIENT: Melissa Villanueva DOB: 1946/08/12  Chief Complaint  Patient presents with  . Dizziness    She would like to review her MRI results.  Says her dizzy spells have become less frequent.  She has developed neck pain that is radiating into her left arm.  This is a new symptoms since last seen.     HISTORICAL  Melissa Villanueva is a 70 years old right-handed female, seen in refer by her primary care Dr. [name] for evaluation of sudden onset dizziness, initial evaluation was on September 20 2016.  She had a past medical history of rheumatoid arthritis, acid reflux, she had a history of left hip replacement, right knee replacement, is a candidate for right hip replacement.  She has baseline gait abnormality, walking with a cane, still lives by herself independently.  She went to bed at her baseline on August 22 2016, woke up next morning September 26 notice severe vertigo, nauseous, lasting for a few hours, she was evaluated by her primary care physician Dr. Maudie Mercury, is referred to our clinic for further evaluation.  Over the past 3 weeks, symptoms overall has much improved, but with sudden positional change she continued to experience transient dizziness, she complains of gradual onset slow mild hearing loss bilaterally, she noticed she has to turn up volume on her TV, she denies tinnitus.  UPDATE Dec 5th 2017: She has pain at her felt left shoulder, radiating down to her left arm, left elbow, is a candidate for right hip replacement,  Her dizziness overall has much improved, we have personally reviewed MRI of the brain in October 2017, generalized atrophy, mild supratentorium small vessel disease  REVIEW OF SYSTEMS: Full 14 system review of systems performed and notable only for memory loss, dizziness, incontinence of bladder  ALLERGIES: Allergies  Allergen Reactions  . Leflunomide Itching and Rash    HOME MEDICATIONS: Current Outpatient Prescriptions  Medication Sig Dispense  Refill  . alendronate (FOSAMAX) 70 MG tablet once a week.    . Calcium Citrate-Vitamin D (CALCIUM + D PO) Take 1,200 mg by mouth daily.    . famotidine (PEPCID) 40 MG tablet Take 40 mg by mouth daily.    . folic acid (FOLVITE) A999333 MCG tablet Take 400 mcg by mouth daily.    . meclizine (ANTIVERT) 25 MG tablet as needed.    . naproxen (NAPROSYN) 500 MG tablet Take 500 mg by mouth 2 (two) times daily as needed.    . Probiotic Product (PROBIOTIC PO) Take 1 capsule by mouth daily.    . solifenacin (VESICARE) 5 MG tablet Take 5 mg by mouth daily.     No current facility-administered medications for this visit.     PAST MEDICAL HISTORY: Past Medical History:  Diagnosis Date  . Arthritis   . Dizziness   . GERD (gastroesophageal reflux disease)   . Headache   . Pancytopenia ([place]) 2016  . PONV (postoperative nausea and vomiting)   . Stress incontinence     PAST SURGICAL HISTORY: Past Surgical History:  Procedure Laterality Date  . CESAREAN SECTION    . CHOLECYSTECTOMY    . EYE SURGERY  Sept 2015   Bilateral Glaucoma with surgery  . TOTAL HIP ARTHROPLASTY Left   . TOTAL KNEE ARTHROPLASTY Right 10/12/2015   Procedure: RIGHT TOTAL KNEE ARTHROPLASTY;  Surgeon: Gaynelle Arabian, MD;  Location: [facility];  Service: Orthopedics;  Laterality: Right;    FAMILY HISTORY: Family History  Problem Relation Age of Onset  .  Heart disease Mother   . Cirrhosis Father   . Heart attack Brother   . Cancer Maternal Aunt     Lung  . Cancer Paternal Aunt     Gastric  . Cancer Brother     SOCIAL HISTORY:  Social History   Social History  . Marital status: Widowed    Spouse name: N/A  . Number of children: 5  . Years of education: HS   Occupational History  . Retired    Social History Main Topics  . Smoking status: Former Smoker    Quit date: 07/11/1986  . Smokeless tobacco: Never Used  . Alcohol use Yes     Comment: occasional   . Drug use: No  . Sexual activity: Not on file   Other  Topics Concern  . Not on file   Social History Narrative   Lives at home alone.   Right-handed.   1 cup caffeine daily.     PHYSICAL EXAM   Vitals:   11/01/16 1029  BP: (!) 163/75  Pulse: 66  Weight: 190 lb 4 oz (86.3 kg)  Height: 5\' 1"  (1.549 m)    Not recorded      Body mass index is 35.95 kg/m.  PHYSICAL EXAMNIATION:  Gen: NAD, conversant, well nourised, obese, well groomed                     Cardiovascular: Regular rate rhythm, no peripheral edema, warm, nontender. Eyes: Conjunctivae clear without exudates or hemorrhage Neck: Supple, no carotid bruits. Pulmonary: Clear to auscultation bilaterally   NEUROLOGICAL EXAM:  MENTAL STATUS: Speech:    Speech is normal; fluent and spontaneous with normal comprehension.  Cognition:     Orientation to time, place and person     Normal recent and remote memory     Normal Attention span and concentration     Normal Language, naming, repeating,spontaneous speech     Fund of knowledge   CRANIAL NERVES: CN II: Visual fields are full to confrontation. Fundoscopic exam is normal with sharp discs and no vascular changes. Pupils are round equal and briskly reactive to light. CN III, IV, VI: extraocular movement are normal. No ptosis. CN V: Facial sensation is intact to pinprick in all 3 divisions bilaterally. Corneal responses are intact.  CN VII: Face is symmetric with normal eye closure and smile. CN VIII: Hearing is normal to rubbing fingers CN IX, X: Palate elevates symmetrically. Phonation is normal. CN XI: Head turning and shoulder shrug are intact CN XII: Tongue is midline with normal movements and no atrophy.  MOTOR: There is no pronator drift of out-stretched arms. Muscle bulk and tone are normal. Muscle strength is normal.  REFLEXES: Reflexes are 2+ and symmetric at the biceps, triceps, knees, and ankles. Plantar responses are flexor.  SENSORY: Intact to light touch, pinprick, positional sensation and  vibratory sensation are intact in fingers and toes.  COORDINATION: Rapid alternating movements and fine finger movements are intact. There is no dysmetria on finger-to-nose and heel-knee-shin.    GAIT/STANCE: She needs push up to get up from seated position, antalgic cautious, unsteady gait  Romberg is absent.   DIAGNOSTIC DATA (LABS, IMAGING, TESTING) - I reviewed patient records, labs, notes, testing and imaging myself where available.   ASSESSMENT AND PLAN  Melissa Villanueva is a 70 y.o. female   Acute onset of vertigo  MRI of brain showed generalized atrophy, no acute abnormality Keep Aspirin 81 mg daily  Differentiation diagnosis remains  benign positional vertigo repositional maneuver, as needed meclizine   Marcial Pacas, M.D. Ph.D.  [facility] [address] [place], [place] [address] Ph: [phone] Fax: [phone]  CC: Referring Provider

## 2016-12-02 DIAGNOSIS — M1611 Unilateral primary osteoarthritis, right hip: Secondary | ICD-10-CM | POA: Diagnosis not present

## 2016-12-05 DIAGNOSIS — Z6835 Body mass index (BMI) 35.0-35.9, adult: Secondary | ICD-10-CM | POA: Diagnosis not present

## 2016-12-05 DIAGNOSIS — M0589 Other rheumatoid arthritis with rheumatoid factor of multiple sites: Secondary | ICD-10-CM | POA: Diagnosis not present

## 2016-12-05 DIAGNOSIS — R5383 Other fatigue: Secondary | ICD-10-CM | POA: Diagnosis not present

## 2016-12-05 DIAGNOSIS — D61818 Other pancytopenia: Secondary | ICD-10-CM | POA: Diagnosis not present

## 2016-12-05 DIAGNOSIS — E669 Obesity, unspecified: Secondary | ICD-10-CM | POA: Diagnosis not present

## 2016-12-05 DIAGNOSIS — Z79899 Other long term (current) drug therapy: Secondary | ICD-10-CM | POA: Diagnosis not present

## 2016-12-05 DIAGNOSIS — M150 Primary generalized (osteo)arthritis: Secondary | ICD-10-CM | POA: Diagnosis not present

## 2016-12-05 DIAGNOSIS — M8580 Other specified disorders of bone density and structure, unspecified site: Secondary | ICD-10-CM | POA: Diagnosis not present

## 2016-12-16 ENCOUNTER — Other Ambulatory Visit (HOSPITAL_COMMUNITY): Payer: Self-pay

## 2016-12-16 ENCOUNTER — Ambulatory Visit (HOSPITAL_COMMUNITY): Payer: Self-pay | Admitting: Oncology

## 2016-12-21 DIAGNOSIS — M0589 Other rheumatoid arthritis with rheumatoid factor of multiple sites: Secondary | ICD-10-CM | POA: Diagnosis not present

## 2017-01-05 DIAGNOSIS — M0589 Other rheumatoid arthritis with rheumatoid factor of multiple sites: Secondary | ICD-10-CM | POA: Diagnosis not present

## 2017-01-18 DIAGNOSIS — R42 Dizziness and giddiness: Secondary | ICD-10-CM | POA: Diagnosis not present

## 2017-01-18 DIAGNOSIS — M810 Age-related osteoporosis without current pathological fracture: Secondary | ICD-10-CM | POA: Diagnosis not present

## 2017-01-18 DIAGNOSIS — K219 Gastro-esophageal reflux disease without esophagitis: Secondary | ICD-10-CM | POA: Diagnosis not present

## 2017-01-18 DIAGNOSIS — M069 Rheumatoid arthritis, unspecified: Secondary | ICD-10-CM | POA: Diagnosis not present

## 2017-01-19 ENCOUNTER — Ambulatory Visit (HOSPITAL_COMMUNITY): Payer: Self-pay | Admitting: Oncology

## 2017-01-19 ENCOUNTER — Other Ambulatory Visit (HOSPITAL_COMMUNITY): Payer: Self-pay

## 2017-01-19 DIAGNOSIS — M0589 Other rheumatoid arthritis with rheumatoid factor of multiple sites: Secondary | ICD-10-CM | POA: Diagnosis not present

## 2017-01-30 DIAGNOSIS — M810 Age-related osteoporosis without current pathological fracture: Secondary | ICD-10-CM | POA: Diagnosis not present

## 2017-01-30 DIAGNOSIS — Z0000 Encounter for general adult medical examination without abnormal findings: Secondary | ICD-10-CM | POA: Diagnosis not present

## 2017-01-30 DIAGNOSIS — M069 Rheumatoid arthritis, unspecified: Secondary | ICD-10-CM | POA: Diagnosis not present

## 2017-02-07 DIAGNOSIS — E0590 Thyrotoxicosis, unspecified without thyrotoxic crisis or storm: Secondary | ICD-10-CM | POA: Diagnosis not present

## 2017-02-07 DIAGNOSIS — E7800 Pure hypercholesterolemia, unspecified: Secondary | ICD-10-CM | POA: Diagnosis not present

## 2017-02-07 DIAGNOSIS — M069 Rheumatoid arthritis, unspecified: Secondary | ICD-10-CM | POA: Diagnosis not present

## 2017-02-16 ENCOUNTER — Other Ambulatory Visit (HOSPITAL_COMMUNITY): Payer: Self-pay | Admitting: *Deleted

## 2017-02-16 DIAGNOSIS — M0589 Other rheumatoid arthritis with rheumatoid factor of multiple sites: Secondary | ICD-10-CM | POA: Diagnosis not present

## 2017-02-16 DIAGNOSIS — D61818 Other pancytopenia: Secondary | ICD-10-CM

## 2017-02-17 ENCOUNTER — Encounter (HOSPITAL_BASED_OUTPATIENT_CLINIC_OR_DEPARTMENT_OTHER): Payer: Medicare Other | Admitting: Oncology

## 2017-02-17 ENCOUNTER — Encounter (HOSPITAL_COMMUNITY): Payer: Self-pay | Admitting: Oncology

## 2017-02-17 ENCOUNTER — Encounter (HOSPITAL_COMMUNITY): Payer: Medicare Other | Attending: Oncology

## 2017-02-17 VITALS — BP 125/51 | HR 62 | Temp 98.1°F | Resp 16 | Wt 178.2 lb

## 2017-02-17 DIAGNOSIS — D61818 Other pancytopenia: Secondary | ICD-10-CM

## 2017-02-17 DIAGNOSIS — Z79899 Other long term (current) drug therapy: Secondary | ICD-10-CM | POA: Insufficient documentation

## 2017-02-17 DIAGNOSIS — M069 Rheumatoid arthritis, unspecified: Secondary | ICD-10-CM | POA: Diagnosis not present

## 2017-02-17 DIAGNOSIS — Z0000 Encounter for general adult medical examination without abnormal findings: Secondary | ICD-10-CM

## 2017-02-17 LAB — CBC WITH DIFFERENTIAL/PLATELET
Basophils Absolute: 0 10*3/uL (ref 0.0–0.1)
Basophils Relative: 0 %
EOS ABS: 0 10*3/uL (ref 0.0–0.7)
Eosinophils Relative: 0 %
HEMATOCRIT: 36.1 % (ref 36.0–46.0)
Hemoglobin: 11.4 g/dL — ABNORMAL LOW (ref 12.0–15.0)
LYMPHS ABS: 1.1 10*3/uL (ref 0.7–4.0)
Lymphocytes Relative: 18 %
MCH: 28.1 pg (ref 26.0–34.0)
MCHC: 31.6 g/dL (ref 30.0–36.0)
MCV: 89.1 fL (ref 78.0–100.0)
MONO ABS: 0.4 10*3/uL (ref 0.1–1.0)
MONOS PCT: 7 %
NEUTROS PCT: 75 %
Neutro Abs: 4.5 10*3/uL (ref 1.7–7.7)
Platelets: 235 10*3/uL (ref 150–400)
RBC: 4.05 MIL/uL (ref 3.87–5.11)
RDW: 15.7 % — AB (ref 11.5–15.5)
WBC: 6 10*3/uL (ref 4.0–10.5)

## 2017-02-17 LAB — COMPREHENSIVE METABOLIC PANEL
ALT: 12 U/L — ABNORMAL LOW (ref 14–54)
ANION GAP: 13 (ref 5–15)
AST: 28 U/L (ref 15–41)
Albumin: 4.2 g/dL (ref 3.5–5.0)
Alkaline Phosphatase: 47 U/L (ref 38–126)
BILIRUBIN TOTAL: 0.5 mg/dL (ref 0.3–1.2)
BUN: 17 mg/dL (ref 6–20)
CALCIUM: 10.7 mg/dL — AB (ref 8.9–10.3)
CO2: 24 mmol/L (ref 22–32)
Chloride: 103 mmol/L (ref 101–111)
Creatinine, Ser: 0.89 mg/dL (ref 0.44–1.00)
Glucose, Bld: 87 mg/dL (ref 65–99)
Potassium: 3.5 mmol/L (ref 3.5–5.1)
Sodium: 140 mmol/L (ref 135–145)
TOTAL PROTEIN: 7.8 g/dL (ref 6.5–8.1)

## 2017-02-17 LAB — VITAMIN B12: VITAMIN B 12: 434 pg/mL (ref 180–914)

## 2017-02-17 LAB — FOLATE: FOLATE: 42.7 ng/mL (ref >5.9)

## 2017-02-17 LAB — IRON AND TIBC
Iron: 43 ug/dL (ref 28–170)
Saturation Ratios: 13 % (ref 10.4–31.8)
TIBC: 326 ug/dL (ref 250–450)
UIBC: 283 ug/dL

## 2017-02-17 LAB — FERRITIN: FERRITIN: 189 ng/mL (ref 11–307)

## 2017-02-17 NOTE — Progress Notes (Signed)
Melissa Gravel, MD [address] [place] [address]  Pancytopenia [facility]) - Plan: CBC with Differential, Comprehensive metabolic panel, Vitamin Q11, Folate, Iron and TIBC, Ferritin, Vitamin B12, Folate, Iron and TIBC, Ferritin  Preventative health care - Plan: MM SCREENING BREAST TOMO BILATERAL  CURRENT THERAPY: Observation  INTERVAL HISTORY: Melissa Villanueva 71 y.o. female returns for followup of h/o pancytopenia in the setting of RA on treatment with Plaquenil, previously treated with Plaquenil and MTX.   Patient is doing very well. She denies any issues at this point in time. She denies any recurrent antibiotic needs or hospitalizations. She continues to follow with rheumatology for her rheumatoid arthritis. She recently started Orencia treatment. She sees Dr. Amil Amen.  She is interested in being released from our clinic since her blood counts have stabilized and nearly normalized.  She denies any blood in her stools or hematuria. She denies any hemoptysis. Rheumatoid arthritis currently controlled.  Review of Systems  Constitutional: Negative.  Negative for chills, fever and weight loss.  HENT: Negative.   Eyes: Negative.   Respiratory: Negative.  Negative for cough.   Cardiovascular: Negative.  Negative for chest pain.  Gastrointestinal: Negative.  Negative for blood in stool, constipation, diarrhea, melena, nausea and vomiting.  Genitourinary: Negative.   Musculoskeletal: Negative.   Skin: Negative.   Neurological: Negative.  Negative for weakness.  Endo/Heme/Allergies: Negative.   Psychiatric/Behavioral: Negative.     Past Medical History:  Diagnosis Date  . Arthritis   . Dizziness   . GERD (gastroesophageal reflux disease)   . Headache   . Pancytopenia ([place]) 2016  . PONV (postoperative nausea and vomiting)   . Stress incontinence     Past Surgical History:  Procedure Laterality Date  . CESAREAN SECTION    . CHOLECYSTECTOMY    . EYE  SURGERY  Sept 2015   Bilateral Glaucoma with surgery  . TOTAL HIP ARTHROPLASTY Left   . TOTAL KNEE ARTHROPLASTY Right 10/12/2015   Procedure: RIGHT TOTAL KNEE ARTHROPLASTY;  Surgeon: Gaynelle Arabian, MD;  Location: [facility];  Service: Orthopedics;  Laterality: Right;    Family History  Problem Relation Age of Onset  . Heart disease Mother   . Cirrhosis Father   . Heart attack Brother   . Cancer Maternal Aunt     Lung  . Cancer Paternal Aunt     Gastric  . Cancer Brother     Social History   Social History  . Marital status: Widowed    Spouse name: N/A  . Number of children: 5  . Years of education: HS   Occupational History  . Retired    Social History Main Topics  . Smoking status: Former Smoker    Quit date: 07/11/1986  . Smokeless tobacco: Never Used  . Alcohol use Yes     Comment: occasional   . Drug use: No  . Sexual activity: Not on file   Other Topics Concern  . Not on file   Social History Narrative   Lives at home alone.   Right-handed.   1 cup caffeine daily.     PHYSICAL EXAMINATION  ECOG PERFORMANCE STATUS: 1 - Symptomatic but completely ambulatory  Vitals:   02/17/17 1448  BP: (!) 125/51  Pulse: 62  Resp: 16  Temp: 98.1 F (36.7 C)    GENERAL:alert, no distress, well nourished, well developed, comfortable, cooperative, obese, smiling and accompanied by granddaughter SKIN: skin color, texture, turgor are normal, no rashes  or significant lesions HEAD: Normocephalic, No masses, lesions, tenderness or abnormalities EYES: normal, EOMI, Conjunctiva are pink and non-injected EARS: External ears normal OROPHARYNX:lips, buccal mucosa, and tongue normal and mucous membranes are moist  NECK: supple, trachea midline LYMPH:  no palpable lymphadenopathy BREAST:not examined LUNGS: clear to auscultation  HEART: regular rate & rhythm ABDOMEN:abdomen soft and normal bowel sounds, obese BACK: Back symmetric, no curvature. EXTREMITIES:less then 2 second  capillary refill, no joint deformities, effusion, or inflammation, no skin discoloration, no cyanosis  NEURO: alert & oriented x 3 with fluent speech, no focal motor/sensory deficits, gait normal   LABORATORY DATA: CBC    Component Value Date/Time   WBC 6.0 02/17/2017 1402   RBC 4.05 02/17/2017 1402   HGB 11.4 (L) 02/17/2017 1402   HCT 36.1 02/17/2017 1402   PLT 235 02/17/2017 1402   MCV 89.1 02/17/2017 1402   MCH 28.1 02/17/2017 1402   MCHC 31.6 02/17/2017 1402   RDW 15.7 (H) 02/17/2017 1402   LYMPHSABS 1.1 02/17/2017 1402   MONOABS 0.4 02/17/2017 1402   EOSABS 0.0 02/17/2017 1402   BASOSABS 0.0 02/17/2017 1402      Chemistry      Component Value Date/Time   NA 138 06/15/2016 1053   K 4.0 06/15/2016 1053   CL 103 06/15/2016 1053   CO2 31 06/15/2016 1053   BUN 12 06/15/2016 1053   CREATININE 0.85 06/15/2016 1053      Component Value Date/Time   CALCIUM 9.5 06/15/2016 1053   ALKPHOS 54 06/15/2016 1053   AST 20 06/15/2016 1053   ALT 11 (L) 06/15/2016 1053   BILITOT 0.7 06/15/2016 1053        PENDING LABS:   RADIOGRAPHIC STUDIES:  No results found.   PATHOLOGY:    ASSESSMENT AND PLAN:  Pancytopenia ([place]) H/O pancytopenia in the setting of RA on treatment with Plaquenil, previously treated with Plaquenil. Now with only mild thrombocytopenia since discontinuation of MTX. Her Rheumatologist is Dr. Amil Amen in [place].  She recently (2-3 months ago- April or May 2017) discontinued her Plaquenil due to cost- unbeknownst to Dr. Amil Amen.  Labs today: CBC diff, CMET, anemia panel.  I personally reviewed and went over laboratory results with the patient.  The results are noted within this dictation.  Labs in 9 months: CBC diff, CMET, anemia panel.    She is currently on Orencia (abatacept) for her RA, managed by Dr. Amil Amen (Rheumatology).  I personally reviewed and went over radiographic studies with the patient.  The results are noted within this dictation.  Last  mammogram was in August 2017 and was negative.  She will be due for her next screening mammogram in August 2018.    Order is placed for mammogram in August 2018.  Return in 9 months for follow-up. If labs are stable, we can consider annual appointment visit x 1-2 years versus release from the clinic.   ORDERS PLACED FOR THIS ENCOUNTER: Orders Placed This Encounter  Procedures  . MM SCREENING BREAST TOMO BILATERAL  . CBC with Differential  . Comprehensive metabolic panel  . Vitamin B12  . Folate  . Iron and TIBC  . Ferritin  . Vitamin B12  . Folate  . Iron and TIBC  . Ferritin    MEDICATIONS PRESCRIBED THIS ENCOUNTER: No orders of the defined types were placed in this encounter.   THERAPY PLAN:  We will continue to monitor counts.   All questions were answered. The patient knows to call the clinic with  any problems, questions or concerns. We can certainly see the patient much sooner if necessary.  Patient and plan discussed with Dr. Twana First and she is in agreement with the aforementioned.   This note is electronically signed by: Doy Mince 02/17/2017 3:20 PM

## 2017-02-17 NOTE — Patient Instructions (Signed)
[place] at [facility] Discharge Instructions  RECOMMENDATIONS MADE BY THE CONSULTANT AND ANY TEST RESULTS WILL BE SENT TO YOUR REFERRING PHYSICIAN.  You saw Kirby Crigler, PA-C, today. Labs and follow up in 9 months. See Amy at checkout for appointments.  Thank you for choosing [place] at [facility] to provide your oncology and hematology care.  To afford each patient quality time with our provider, please arrive at least 15 minutes before your scheduled appointment time.    If you have a lab appointment with the [facility] please come in thru the  Main Entrance and check in at the main information desk  You need to re-schedule your appointment should you arrive 10 or more minutes late.  We strive to give you quality time with our providers, and arriving late affects you and other patients whose appointments are after yours.  Also, if you no show three or more times for appointments you may be dismissed from the clinic at the providers discretion.     Again, thank you for choosing [facility].  Our hope is that these requests will decrease the amount of time that you wait before being seen by our physicians.       _____________________________________________________________  Should you have questions after your visit to [facility], please contact our office at [phone] between the hours of 8:30 a.m. and 4:30 p.m.  Voicemails left after 4:30 p.m. will not be returned until the following business day.  For prescription refill requests, have your pharmacy contact our office.       Resources For Cancer Patients and their Caregivers ? American Cancer Society: Can assist with transportation, wigs, general needs, runs Look Good Feel Better.        470-637-8226 ? Cancer Care: Provides financial assistance, online support groups, medication/co-pay assistance.  1-800-813-HOPE 7544190865) ? [place] Assists [place] cancer patients and their families through emotional , educational and financial support.  431-849-4097 ? [place] DSS Where to apply for food stamps, Medicaid and utility assistance. (843) 687-3759 ? [facility]: Transportation to medical appointments. (858) 546-5307 ? Social Security Administration: May apply for disability if have a Stage IV cancer. 937-327-9990 (772)207-7751 ? [employer], Disability and Transit Services: Assists with nutrition, care and transit needs. [place] Support Programs: @10RELATIVEDAYS @ > Cancer Support Group  2nd Tuesday of the month 1pm-2pm, Journey Room  > Creative Journey  3rd Tuesday of the month 1130am-1pm, Journey Room  > Look Good Feel Better  1st Wednesday of the month 10am-12 noon, Journey Room (Call [place] to register [phone])

## 2017-02-17 NOTE — Assessment & Plan Note (Addendum)
H/O pancytopenia in the setting of RA on treatment with Plaquenil, previously treated with Plaquenil. Now with only mild thrombocytopenia since discontinuation of MTX. Her Rheumatologist is Dr. Amil Amen in [place].  She recently (2-3 months ago- April or May 2017) discontinued her Plaquenil due to cost- unbeknownst to Dr. Amil Amen.  Labs today: CBC diff, CMET, anemia panel.  I personally reviewed and went over laboratory results with the patient.  The results are noted within this dictation.  Labs in 9 months: CBC diff, CMET, anemia panel.    She is currently on Orencia (abatacept) for her RA, managed by Dr. Amil Amen (Rheumatology).  I personally reviewed and went over radiographic studies with the patient.  The results are noted within this dictation.  Last mammogram was in August 2017 and was negative.  She will be due for her next screening mammogram in August 2018.    Order is placed for mammogram in August 2018.  Return in 9 months for follow-up. If labs are stable, we can consider annual appointment visit x 1-2 years versus release from the clinic.

## 2017-02-27 DIAGNOSIS — E0590 Thyrotoxicosis, unspecified without thyrotoxic crisis or storm: Secondary | ICD-10-CM | POA: Diagnosis not present

## 2017-03-07 DIAGNOSIS — R739 Hyperglycemia, unspecified: Secondary | ICD-10-CM | POA: Diagnosis not present

## 2017-03-07 DIAGNOSIS — K219 Gastro-esophageal reflux disease without esophagitis: Secondary | ICD-10-CM | POA: Diagnosis not present

## 2017-03-07 DIAGNOSIS — M069 Rheumatoid arthritis, unspecified: Secondary | ICD-10-CM | POA: Diagnosis not present

## 2017-03-07 DIAGNOSIS — R32 Unspecified urinary incontinence: Secondary | ICD-10-CM | POA: Diagnosis not present

## 2017-03-08 DIAGNOSIS — E669 Obesity, unspecified: Secondary | ICD-10-CM | POA: Diagnosis not present

## 2017-03-08 DIAGNOSIS — Z79899 Other long term (current) drug therapy: Secondary | ICD-10-CM | POA: Diagnosis not present

## 2017-03-08 DIAGNOSIS — Z6833 Body mass index (BMI) 33.0-33.9, adult: Secondary | ICD-10-CM | POA: Diagnosis not present

## 2017-03-08 DIAGNOSIS — D61818 Other pancytopenia: Secondary | ICD-10-CM | POA: Diagnosis not present

## 2017-03-08 DIAGNOSIS — M150 Primary generalized (osteo)arthritis: Secondary | ICD-10-CM | POA: Diagnosis not present

## 2017-03-08 DIAGNOSIS — R5383 Other fatigue: Secondary | ICD-10-CM | POA: Diagnosis not present

## 2017-03-08 DIAGNOSIS — M0589 Other rheumatoid arthritis with rheumatoid factor of multiple sites: Secondary | ICD-10-CM | POA: Diagnosis not present

## 2017-03-08 DIAGNOSIS — M8580 Other specified disorders of bone density and structure, unspecified site: Secondary | ICD-10-CM | POA: Diagnosis not present

## 2017-04-03 ENCOUNTER — Ambulatory Visit: Payer: Self-pay | Admitting: Orthopedic Surgery

## 2017-04-03 NOTE — H&P (Signed)
Melissa Villanueva DOB: 04-20-1946 Widowed / Language: [name] / Race: White Female Date of Admission:  04/26/2017 CC:  Right hip pain History of Present Illness The patient is a 71 year old female who comes in for a preoperative History and Physical. The patient is scheduled for a right total hip arthroplasty (anterior) to be performed by Dr. Dione Plover. Aluisio, MD at [facility] on 04-26-2017. The patient is a 71 year old female who presented for follow up of their hip. The patient is being followed for their right hip pain. They are over a year out from IA injection. Symptoms reported include: pain, pain after sitting, pain with weightbearing and difficulty ambulating. The patient feels that they are doing poorly and report their pain level to be moderate to severe. The patient has not gotten any relief of their symptoms with Cortisone injections or physical therapy. The patient indicates that they have questions or concerns today regarding their progress at this point and surgery. Her right hip continues to be very problematic and if anything, is getting worse over time. The intraarticular injection did not help. Hip is limiting what she can and cannot do. She has had her other hip replaced and did very well with that in the past. I replaced her knee last year and that is doing fine. The hip continues to be problematic. AP pelvis and lateral of the right hip. She has got bone on bone arthritis in that hip with subchondral cystic formation, osteophyte formation. An x-ray was taken today. Her left hip prosthesis is in good position with no abnormalities. She has got advanced arthritis of the right hip with progressively worsening pain and dysfunction. She has stated that she wants to get this fixed. The most predictable means of improving her pain and function will be total hip arthroplasty. She is having a very difficulty time walking and functioning. She has had an injection without benefit.  She is ready to proceed with surgery. They have been treated conservatively in the past for the above stated problem and despite conservative measures, they continue to have progressive pain and severe functional limitations and dysfunction. They have failed non-operative management including home exercise, medications, and injections. It is felt that they would benefit from undergoing total joint replacement. Risks and benefits of the procedure have been discussed with the patient and they elect to proceed with surgery. There are no active contraindications to surgery such as ongoing infection or rapidly progressive neurological disease.  Problem List/Past Medical Diastolic congestive heart failure (I50.30)  CRF (chronic renal failure) (N18.9)  Chronic Neutropenia  Chronic Thrombocytopenia  Right knee pain (M25.561)  Primary osteoarthritis of right hip (M16.11)  Pain of right hip joint (M25.551)  Osteoporosis  Rheumatoid Arthritis  Varicose veins  Hemorrhoids  Urinary Incontinence  Gastroesophageal Reflux Disease  Hyperlipidemia  Primary osteoarthritis of right knee (M17.11)  Urinary tract infection in female (N39.0)  Vertigo   Allergies No Known Drug Allergies   Family History Cancer  Brother. Drug / Alcohol Addiction  Father. First Degree Relatives  reported Heart Disease  Mother. Heart disease in female family member before age 54   Social History Marital status  widowed Living situation  live alone Exercise  Exercises rarely; does running / walking Current work status  retired Current drinker  07/01/2014: Currently drinks wine only occasionally per week Tobacco / smoke exposure  07/01/2014: no Number of flights of stairs before winded  1 Tobacco use  Former smoker. 07/01/2014: smoke(d) 1  pack(s) per day Children  5 or more Not under pain contract  Advance Directives  Living Will  Medication History  Probiotic (Oral)  Active. Hydroxychloroquine Sulfate (200MG  Tablet, Oral) Active. VESIcare (5MG  Tablet, Oral) Active. Famotidine (40MG  Tablet, Oral) Active. Aspirin (81MG  Tablet, Oral) Active. Folic Acid (Oral) Specific strength unknown - Active. Naproxen (500MG  Tablet, Oral) Active. Alendronate Sodium (70MG  Tablet, Oral) Active. Glucosamine 1500 Complex (Oral) Active. Calcium-Vitamin D (Oral) Specific strength unknown - Active. Meclizine HCl (25MG  Tablet, Oral) Active.  Past Surgical History Gallbladder Surgery  Date: 1989. open Total Knee Replacement - Right [10/12/2015]: Total Hip Replacement  Date: 2009. left C-Section  Date: 39. Cataract Surgery - Bilateral  Date: 2016.   Review of Systems General Not Present- Chills, Fatigue, Fever, Memory Loss, Night Sweats, Weight Gain and Weight Loss. Skin Not Present- Eczema, Hives, Itching, Lesions and Rash. HEENT Present- Hearing Loss. Not Present- Dentures, Double Vision, Headache, Tinnitus and Visual Loss. Respiratory Not Present- Allergies, Chronic Cough, Coughing up blood, Shortness of breath at rest and Shortness of breath with exertion. Cardiovascular Present- Swelling. Not Present- Chest Pain, Difficulty Breathing Lying Down, Murmur, Palpitations and Racing/skipping heartbeats. Gastrointestinal Not Present- Abdominal Pain, Bloody Stool, Constipation, Diarrhea, Difficulty Swallowing, Heartburn, Jaundice, Loss of appetitie, Nausea and Vomiting. Female Genitourinary Present- Urinating at Night. Not Present- Blood in Urine, Discharge, Flank Pain, Incontinence, Painful Urination, Urgency, Urinary frequency, Urinary Retention and Weak urinary stream. Musculoskeletal Present- Back Pain, Joint Pain, Joint Swelling, Morning Stiffness and Muscle Weakness. Not Present- Muscle Pain and Spasms. Neurological Not Present- Blackout spells, Difficulty with balance, Dizziness, Paralysis, Tremor and Weakness. Psychiatric Not Present-  Insomnia.  Vitals Weight: 175 lb Height: 62in Weight was reported by patient. Height was reported by patient. Body Surface Area: 1.81 m Body Mass Index: 32.01 kg/m  Pulse: 60 (Regular)  Resp.: 12 (Unlabored)  BP: 148/78 (Sitting, Right Arm, Standard)   Physical Exam  General Mental Status -Alert, cooperative and good historian. General Appearance-pleasant, Not in acute distress. Orientation-Oriented X3. Build & Nutrition-Well nourished and Well developed. Gait-Use of assistive device(cane).  Head and Neck Head-normocephalic, atraumatic . Neck Global Assessment - supple, no bruit auscultated on the right, no bruit auscultated on the left.  Eye Vision-Wears corrective lenses. Pupil - Bilateral-Regular and Round. Motion - Bilateral-EOMI.  ENMT Note: upper dentures   Chest and Lung Exam Auscultation Breath sounds - clear at anterior chest wall and clear at posterior chest wall. Adventitious sounds - No Adventitious sounds.  Cardiovascular Auscultation Rhythm - Regular rate and rhythm. Heart Sounds - S1 WNL and S2 WNL. Murmurs & Other Heart Sounds - Auscultation of the heart reveals - No Murmurs.  Abdomen Palpation/Percussion Tenderness - Abdomen is non-tender to palpation. Rigidity (guarding) - Abdomen is soft. Auscultation Auscultation of the abdomen reveals - Bowel sounds normal.  Female Genitourinary Note: Not done, not pertinent to present illness   Musculoskeletal Note: On exam, she is in no apparent distress. Her right hip can be flexed to 90 with no internal rotation, about 10 to 20 of external rotation, 20 abduction. She is walking with an antalgic gait pattern. Her right knee shows no swelling, range 0 to 120 with no tenderness or instability.  RADIOGRAPHS I reviewed her radiograph AP pelvis and lateral of the right hip. She has got bone on bone arthritis in that hip with subchondral cystic formation, osteophyte formation.  An x-ray was taken today. Her left hip prosthesis is in good position with no abnormalities.  Assessment & Plan Primary osteoarthritis  of right hip (M16.11) Status post total right knee replacement (P12.258)  Note:Surgical Plans: Right Total Hip Replacement - Anterior Approach  Disposition: Home with son, Straight to outpatient therapy at [facility] on June 4th. Arrangements are being made preop. Patient states that she has all of her equiopment from her previous surgeries  PCP: Dr. [name] - Patient has been seen preoperatively and felt to be stable for surgery.  IV TXA  Anesthesia Issues: Sometimes nausea and vomiting  Patient was instructed on what medications to stop prior to surgery.  Signed electronically by Joelene Millin, III PA-C

## 2017-04-13 NOTE — Progress Notes (Signed)
Please place orders in EPIC as patient has an appointment with pre-op on 04/19/2017! Thank you!

## 2017-04-14 ENCOUNTER — Ambulatory Visit: Payer: Self-pay | Admitting: Orthopedic Surgery

## 2017-04-17 NOTE — Patient Instructions (Addendum)
Melissa Villanueva  04/17/2017   Your procedure is scheduled on: 04/26/2017    Report to [facility] Main  Entrance   Report to admitting at  [place] AM   Call this number if you have problems the morning of surgery [phone]   Remember: ONLY 1 PERSON MAY GO WITH YOU TO SHORT STAY TO GET  READY MORNING OF YOUR SURGERY.  Do not eat food or drink liquids :After Midnight.     Take these medicines the morning of surgery with A SIP OF WATER: pepcid                                 You may not have any metal on your body including hair pins and              piercings  Do not wear jewelry, make-up, lotions, powders or perfumes, deodorant             Do not wear nail polish.  Do not shave  48 hours prior to surgery.                Do not bring valuables to the hospital. [place].  Contacts, dentures or bridgework may not be worn into surgery.  Leave suitcase in the car. After surgery it may be brought to your room.                   Please read over the following fact sheets you were given: _____________________________________________________________________             [facility] - Preparing for Surgery Before surgery, you can play an important role.  Because skin is not sterile, your skin needs to be as free of germs as possible.  You can reduce the number of germs on your skin by washing with CHG (chlorahexidine gluconate) soap before surgery.  CHG is an antiseptic cleaner which kills germs and bonds with the skin to continue killing germs even after washing. Please DO NOT use if you have an allergy to CHG or antibacterial soaps.  If your skin becomes reddened/irritated stop using the CHG and inform your nurse when you arrive at Short Stay. Do not shave (including legs and underarms) for at least 48 hours prior to the first CHG shower.  You may shave your face/neck. Please follow these instructions  carefully:  1.  Shower with CHG Soap the night before surgery and the  morning of Surgery.  2.  If you choose to wash your hair, wash your hair first as usual with your  normal  shampoo.  3.  After you shampoo, rinse your hair and body thoroughly to remove the  shampoo.                           4.  Use CHG as you would any other liquid soap.  You can apply chg directly  to the skin and wash                       Gently with a scrungie or clean washcloth.  5.  Apply the CHG Soap to your body ONLY FROM THE  NECK DOWN.   Do not use on face/ open                           Wound or open sores. Avoid contact with eyes, ears mouth and genitals (private parts).                       Wash face,  Genitals (private parts) with your normal soap.             6.  Wash thoroughly, paying special attention to the area where your surgery  will be performed.  7.  Thoroughly rinse your body with warm water from the neck down.  8.  DO NOT shower/wash with your normal soap after using and rinsing off  the CHG Soap.                9.  Pat yourself dry with a clean towel.            10.  Wear clean pajamas.            11.  Place clean sheets on your bed the night of your first shower and do not  sleep with pets. Day of Surgery : Do not apply any lotions/deodorants the morning of surgery.  Please wear clean clothes to the hospital/surgery center.  FAILURE TO FOLLOW THESE INSTRUCTIONS MAY RESULT IN THE CANCELLATION OF YOUR SURGERY PATIENT SIGNATURE_________________________________  NURSE SIGNATURE__________________________________  ________________________________________________________________________  WHAT IS A BLOOD TRANSFUSION? Blood Transfusion Information  A transfusion is the replacement of blood or some of its parts. Blood is made up of multiple cells which provide different functions.  Red blood cells carry oxygen and are used for blood loss replacement.  White blood cells fight against  infection.  Platelets control bleeding.  Plasma helps clot blood.  Other blood products are available for specialized needs, such as hemophilia or other clotting disorders. BEFORE THE TRANSFUSION  Who gives blood for transfusions?   Healthy volunteers who are fully evaluated to make sure their blood is safe. This is blood bank blood. Transfusion therapy is the safest it has ever been in the practice of medicine. Before blood is taken from a donor, a complete history is taken to make sure that person has no history of diseases nor engages in risky social behavior (examples are intravenous drug use or sexual activity with multiple partners). The donor's travel history is screened to minimize risk of transmitting infections, such as malaria. The donated blood is tested for signs of infectious diseases, such as HIV and hepatitis. The blood is then tested to be sure it is compatible with you in order to minimize the chance of a transfusion reaction. If you or a relative donates blood, this is often done in anticipation of surgery and is not appropriate for emergency situations. It takes many days to process the donated blood. RISKS AND COMPLICATIONS Although transfusion therapy is very safe and saves many lives, the main dangers of transfusion include:   Getting an infectious disease.  Developing a transfusion reaction. This is an allergic reaction to something in the blood you were given. Every precaution is taken to prevent this. The decision to have a blood transfusion has been considered carefully by your caregiver before blood is given. Blood is not given unless the benefits outweigh the risks. AFTER THE TRANSFUSION  Right after receiving a blood transfusion, you will usually feel much better and more  energetic. This is especially true if your red blood cells have gotten low (anemic). The transfusion raises the level of the red blood cells which carry oxygen, and this usually causes an energy  increase.  The nurse administering the transfusion will monitor you carefully for complications. HOME CARE INSTRUCTIONS  No special instructions are needed after a transfusion. You may find your energy is better. Speak with your caregiver about any limitations on activity for underlying diseases you may have. SEEK MEDICAL CARE IF:   Your condition is not improving after your transfusion.  You develop redness or irritation at the intravenous (IV) site. SEEK IMMEDIATE MEDICAL CARE IF:  Any of the following symptoms occur over the next 12 hours:  Shaking chills.  You have a temperature by mouth above 102 F (38.9 C), not controlled by medicine.  Chest, back, or muscle pain.  People around you feel you are not acting correctly or are confused.  Shortness of breath or difficulty breathing.  Dizziness and fainting.  You get a rash or develop hives.  You have a decrease in urine output.  Your urine turns a dark color or changes to pink, red, or brown. Any of the following symptoms occur over the next 10 days:  You have a temperature by mouth above 102 F (38.9 C), not controlled by medicine.  Shortness of breath.  Weakness after normal activity.  The white part of the eye turns yellow (jaundice).  You have a decrease in the amount of urine or are urinating less often.  Your urine turns a dark color or changes to pink, red, or brown. Document Released: 11/11/2000 Document Revised: 02/06/2012 Document Reviewed: 06/30/2008 ExitCare Patient Information 2014 [place].  _______________________________________________________________________  Incentive Spirometer  An incentive spirometer is a tool that can help keep your lungs clear and active. This tool measures how well you are filling your lungs with each breath. Taking long deep breaths may help reverse or decrease the chance of developing breathing (pulmonary) problems (especially infection) following:  A long  period of time when you are unable to move or be active. BEFORE THE PROCEDURE   If the spirometer includes an indicator to show your best effort, your nurse or respiratory therapist will set it to a desired goal.  If possible, sit up straight or lean slightly forward. Try not to slouch.  Hold the incentive spirometer in an upright position. INSTRUCTIONS FOR USE  1. Sit on the edge of your bed if possible, or sit up as far as you can in bed or on a chair. 2. Hold the incentive spirometer in an upright position. 3. Breathe out normally. 4. Place the mouthpiece in your mouth and seal your lips tightly around it. 5. Breathe in slowly and as deeply as possible, raising the piston or the ball toward the top of the column. 6. Hold your breath for 3-5 seconds or for as long as possible. Allow the piston or ball to fall to the bottom of the column. 7. Remove the mouthpiece from your mouth and breathe out normally. 8. Rest for a few seconds and repeat Steps 1 through 7 at least 10 times every 1-2 hours when you are awake. Take your time and take a few normal breaths between deep breaths. 9. The spirometer may include an indicator to show your best effort. Use the indicator as a goal to work toward during each repetition. 10. After each set of 10 deep breaths, practice coughing to be sure your lungs are  clear. If you have an incision (the cut made at the time of surgery), support your incision when coughing by placing a pillow or rolled up towels firmly against it. Once you are able to get out of bed, walk around indoors and cough well. You may stop using the incentive spirometer when instructed by your caregiver.  RISKS AND COMPLICATIONS  Take your time so you do not get dizzy or light-headed.  If you are in pain, you may need to take or ask for pain medication before doing incentive spirometry. It is harder to take a deep breath if you are having pain. AFTER USE  Rest and breathe slowly and  easily.  It can be helpful to keep track of a log of your progress. Your caregiver can provide you with a simple table to help with this. If you are using the spirometer at home, follow these instructions: [place] IF:   You are having difficultly using the spirometer.  You have trouble using the spirometer as often as instructed.  Your pain medication is not giving enough relief while using the spirometer.  You develop fever of 100.5 F (38.1 C) or higher. SEEK IMMEDIATE MEDICAL CARE IF:   You cough up bloody sputum that had not been present before.  You develop fever of 102 F (38.9 C) or greater.  You develop worsening pain at or near the incision site. MAKE SURE YOU:   Understand these instructions.  Will watch your condition.  Will get help right away if you are not doing well or get worse. Document Released: 03/27/2007 Document Revised: 02/06/2012 Document Reviewed: 05/28/2007 [facility] Patient Information 2014 [place], [place].   ________________________________________________________________________

## 2017-04-19 ENCOUNTER — Encounter (HOSPITAL_COMMUNITY)
Admission: RE | Admit: 2017-04-19 | Discharge: 2017-04-19 | Disposition: A | Discharge status: Home / Self Care | Payer: Medicare Other | Source: Ambulatory Visit | Attending: Orthopedic Surgery | Admitting: Orthopedic Surgery

## 2017-04-19 ENCOUNTER — Encounter (HOSPITAL_COMMUNITY): Payer: Self-pay

## 2017-04-19 DIAGNOSIS — M1710 Unilateral primary osteoarthritis, unspecified knee: Secondary | ICD-10-CM | POA: Insufficient documentation

## 2017-04-19 DIAGNOSIS — Z01812 Encounter for preprocedural laboratory examination: Secondary | ICD-10-CM | POA: Diagnosis not present

## 2017-04-19 DIAGNOSIS — K219 Gastro-esophageal reflux disease without esophagitis: Secondary | ICD-10-CM | POA: Insufficient documentation

## 2017-04-19 DIAGNOSIS — M069 Rheumatoid arthritis, unspecified: Secondary | ICD-10-CM | POA: Diagnosis not present

## 2017-04-19 LAB — APTT: APTT: 34 s (ref 24–36)

## 2017-04-19 LAB — COMPREHENSIVE METABOLIC PANEL
ALBUMIN: 4.1 g/dL (ref 3.5–5.0)
ALK PHOS: 54 U/L (ref 38–126)
ALT: 10 U/L — AB (ref 14–54)
AST: 19 U/L (ref 15–41)
Anion gap: 5 (ref 5–15)
BUN: 13 mg/dL (ref 6–20)
CHLORIDE: 106 mmol/L (ref 101–111)
CO2: 30 mmol/L (ref 22–32)
Calcium: 10 mg/dL (ref 8.9–10.3)
Creatinine, Ser: 0.77 mg/dL (ref 0.44–1.00)
GFR calc Af Amer: >60 mL/min (ref >60)
GFR calc non Af Amer: >60 mL/min (ref >60)
GLUCOSE: 94 mg/dL (ref 65–99)
POTASSIUM: 4.4 mmol/L (ref 3.5–5.1)
SODIUM: 141 mmol/L (ref 135–145)
Total Bilirubin: 0.6 mg/dL (ref 0.3–1.2)
Total Protein: 7.6 g/dL (ref 6.5–8.1)

## 2017-04-19 LAB — SURGICAL PCR SCREEN
MRSA, PCR: NEGATIVE
STAPHYLOCOCCUS AUREUS: POSITIVE — AB

## 2017-04-19 LAB — CBC
HCT: 36.7 % (ref 36.0–46.0)
Hemoglobin: 11.4 g/dL — ABNORMAL LOW (ref 12.0–15.0)
MCH: 28.3 pg (ref 26.0–34.0)
MCHC: 31.1 g/dL (ref 30.0–36.0)
MCV: 91.1 fL (ref 78.0–100.0)
Platelets: 185 10*3/uL (ref 150–400)
RBC: 4.03 MIL/uL (ref 3.87–5.11)
RDW: 17 % — ABNORMAL HIGH (ref 11.5–15.5)
WBC: 6 10*3/uL (ref 4.0–10.5)

## 2017-04-19 LAB — PROTIME-INR
INR: 1.05
Prothrombin Time: 13.7 seconds (ref 11.4–15.2)

## 2017-04-24 ENCOUNTER — Ambulatory Visit: Payer: Self-pay | Admitting: Orthopedic Surgery

## 2017-04-24 NOTE — H&P (Signed)
Melissa Villanueva DOB: 09-13-1946 Widowed / Language: Melissa Villanueva / Race: White Female Date of Admission:  04/26/2017 CC:  Right hip pain History of Present Illness The patient is a 71 year old female who comes in for a preoperative History and Physical. The patient is scheduled for a right total hip arthroplasty (anterior) to be performed by Dr. Dione Plover. Aluisio, MD at [facility] on 04-26-2017. The patient is a 71 year old female who presented for follow up of their hip. The patient is being followed for their right hip pain. They are over a year out from IA injection. Symptoms reported include: pain, pain after sitting, pain with weightbearing and difficulty ambulating. The patient feels that they are doing poorly and report their pain level to be moderate to severe. The patient has not gotten any relief of their symptoms with Cortisone injections or physical therapy. The patient indicates that they have questions or concerns today regarding their progress at this point and surgery. Her right hip continues to be very problematic and if anything, is getting worse over time. The intraarticular injection did not help. Hip is limiting what she can and cannot do. She has had her other hip replaced and did very well with that in the past. I replaced her knee last year and that is doing fine. The hip continues to be problematic. AP pelvis and lateral of the right hip. She has got bone on bone arthritis in that hip with subchondral cystic formation, osteophyte formation. An x-ray was taken today. Her left hip prosthesis is in good position with no abnormalities. She has got advanced arthritis of the right hip with progressively worsening pain and dysfunction. She has stated that she wants to get this fixed. The most predictable means of improving her pain and function will be total hip arthroplasty. She is having a very difficulty time walking and functioning. She has had an injection without benefit.  She is ready to proceed with surgery. They have been treated conservatively in the past for the above stated problem and despite conservative measures, they continue to have progressive pain and severe functional limitations and dysfunction. They have failed non-operative management including home exercise, medications, and injections. It is felt that they would benefit from undergoing total joint replacement. Risks and benefits of the procedure have been discussed with the patient and they elect to proceed with surgery. There are no active contraindications to surgery such as ongoing infection or rapidly progressive neurological disease.  Problem List/Past Medical Diastolic congestive heart failure (I50.30)  CRF (chronic renal failure) (N18.9)  Chronic Neutropenia  Chronic Thrombocytopenia  Right knee pain (M25.561)  Primary osteoarthritis of right hip (M16.11)  Pain of right hip joint (M25.551)  Osteoporosis  Rheumatoid Arthritis  Varicose veins  Hemorrhoids  Urinary Incontinence  Gastroesophageal Reflux Disease  Hyperlipidemia  Primary osteoarthritis of right knee (M17.11)  Urinary tract infection in female (N39.0)  Vertigo   Allergies No Known Drug Allergies   Family History Cancer  Brother. Drug / Alcohol Addiction  Father. First Degree Relatives  reported Heart Disease  Mother. Heart disease in female family member before age 67   Social History Marital status  widowed Living situation  live alone Exercise  Exercises rarely; does running / walking Current work status  retired Current drinker  07/01/2014: Currently drinks wine only occasionally per week Tobacco / smoke exposure  07/01/2014: no Number of flights of stairs before winded  1 Tobacco use  Former smoker. 07/01/2014: smoke(d) 1  pack(s) per day Children  5 or more Not under pain contract  Advance Directives  Living Will  Medication History  Probiotic (Oral)  Active. Hydroxychloroquine Sulfate (200MG  Tablet, Oral) Active. VESIcare (5MG  Tablet, Oral) Active. Famotidine (40MG  Tablet, Oral) Active. Aspirin (81MG  Tablet, Oral) Active. Folic Acid (Oral) Specific strength unknown - Active. Naproxen (500MG  Tablet, Oral) Active. Alendronate Sodium (70MG  Tablet, Oral) Active. Glucosamine 1500 Complex (Oral) Active. Calcium-Vitamin D (Oral) Specific strength unknown - Active. Meclizine HCl (25MG  Tablet, Oral) Active.  Past Surgical History Gallbladder Surgery  Date: 1989. open Total Knee Replacement - Right [10/12/2015]: Total Hip Replacement  Date: 2009. left C-Section  Date: 75. Cataract Surgery - Bilateral  Date: 2016.   Review of Systems General Not Present- Chills, Fatigue, Fever, Memory Loss, Night Sweats, Weight Gain and Weight Loss. Skin Not Present- Eczema, Hives, Itching, Lesions and Rash. HEENT Present- Hearing Loss. Not Present- Dentures, Double Vision, Headache, Tinnitus and Visual Loss. Respiratory Not Present- Allergies, Chronic Cough, Coughing up blood, Shortness of breath at rest and Shortness of breath with exertion. Cardiovascular Present- Swelling. Not Present- Chest Pain, Difficulty Breathing Lying Down, Murmur, Palpitations and Racing/skipping heartbeats. Gastrointestinal Not Present- Abdominal Pain, Bloody Stool, Constipation, Diarrhea, Difficulty Swallowing, Heartburn, Jaundice, Loss of appetitie, Nausea and Vomiting. Female Genitourinary Present- Urinating at Night. Not Present- Blood in Urine, Discharge, Flank Pain, Incontinence, Painful Urination, Urgency, Urinary frequency, Urinary Retention and Weak urinary stream. Musculoskeletal Present- Back Pain, Joint Pain, Joint Swelling, Morning Stiffness and Muscle Weakness. Not Present- Muscle Pain and Spasms. Neurological Not Present- Blackout spells, Difficulty with balance, Dizziness, Paralysis, Tremor and Weakness. Psychiatric Not Present-  Insomnia.  Vitals Weight: 175 lb Height: 62in Weight was reported by patient. Height was reported by patient. Body Surface Area: 1.81 m Body Mass Index: 32.01 kg/m  Pulse: 60 (Regular)  Resp.: 12 (Unlabored)  BP: 148/78 (Sitting, Right Arm, Standard)   Physical Exam  General Mental Status -Alert, cooperative and good historian. General Appearance-pleasant, Not in acute distress. Orientation-Oriented X3. Build & Nutrition-Well nourished and Well developed. Gait-Use of assistive device(cane).  Head and Neck Head-normocephalic, atraumatic . Neck Global Assessment - supple, no bruit auscultated on the right, no bruit auscultated on the left.  Eye Vision-Wears corrective lenses. Pupil - Bilateral-Regular and Round. Motion - Bilateral-EOMI.  ENMT Note: upper dentures   Chest and Lung Exam Auscultation Breath sounds - clear at anterior chest wall and clear at posterior chest wall. Adventitious sounds - No Adventitious sounds.  Cardiovascular Auscultation Rhythm - Regular rate and rhythm. Heart Sounds - S1 WNL and S2 WNL. Murmurs & Other Heart Sounds - Auscultation of the heart reveals - No Murmurs.  Abdomen Palpation/Percussion Tenderness - Abdomen is non-tender to palpation. Rigidity (guarding) - Abdomen is soft. Auscultation Auscultation of the abdomen reveals - Bowel sounds normal.  Female Genitourinary Note: Not done, not pertinent to present illness   Musculoskeletal Note: On exam, she is in no apparent distress. Her right hip can be flexed to 90 with no internal rotation, about 10 to 20 of external rotation, 20 abduction. She is walking with an antalgic gait pattern. Her right knee shows no swelling, range 0 to 120 with no tenderness or instability.  RADIOGRAPHS I reviewed her radiograph AP pelvis and lateral of the right hip. She has got bone on bone arthritis in that hip with subchondral cystic formation,  osteophyte formation. An x-ray was taken today. Her left hip prosthesis is in good position with no abnormalities.  Assessment & Plan Primary osteoarthritis  of right hip (M16.11) Status post total right knee replacement (Z61.096)  Note:Surgical Plans: Right Total Hip Replacement - Anterior Approach  Disposition: Home with son, Straight to outpatient therapy at [facility] on June 4th. Arrangements are being made preop. Patient states that she has all of her equiopment from her previous surgeries  PCP: Dr. [name] - Patient has been seen preoperatively and felt to be stable for surgery.  IV TXA  Anesthesia Issues: Sometimes nausea and vomiting  Patient was instructed on what medications to stop prior to surgery.  Signed electronically by Joelene Millin, III PA-C

## 2017-04-26 ENCOUNTER — Inpatient Hospital Stay (HOSPITAL_COMMUNITY)
Admission: RE | Admit: 2017-04-26 | Discharge: 2017-04-27 | DRG: 470 | Disposition: A | Discharge status: Home / Self Care | Payer: Medicare Other | Source: Ambulatory Visit | Attending: Orthopedic Surgery | Admitting: Orthopedic Surgery

## 2017-04-26 ENCOUNTER — Inpatient Hospital Stay (HOSPITAL_COMMUNITY): Payer: Medicare Other

## 2017-04-26 ENCOUNTER — Encounter (HOSPITAL_COMMUNITY): Payer: Self-pay

## 2017-04-26 ENCOUNTER — Inpatient Hospital Stay (HOSPITAL_COMMUNITY): Payer: Medicare Other | Admitting: Anesthesiology

## 2017-04-26 ENCOUNTER — Encounter (HOSPITAL_COMMUNITY)
Admission: RE | Disposition: A | Discharge status: Home / Self Care | Payer: Self-pay | Source: Ambulatory Visit | Attending: Orthopedic Surgery

## 2017-04-26 DIAGNOSIS — I5030 Unspecified diastolic (congestive) heart failure: Secondary | ICD-10-CM | POA: Diagnosis not present

## 2017-04-26 DIAGNOSIS — E785 Hyperlipidemia, unspecified: Secondary | ICD-10-CM | POA: Diagnosis present

## 2017-04-26 DIAGNOSIS — Z7982 Long term (current) use of aspirin: Secondary | ICD-10-CM

## 2017-04-26 DIAGNOSIS — R269 Unspecified abnormalities of gait and mobility: Secondary | ICD-10-CM | POA: Diagnosis not present

## 2017-04-26 DIAGNOSIS — M069 Rheumatoid arthritis, unspecified: Secondary | ICD-10-CM | POA: Diagnosis not present

## 2017-04-26 DIAGNOSIS — Z8249 Family history of ischemic heart disease and other diseases of the circulatory system: Secondary | ICD-10-CM

## 2017-04-26 DIAGNOSIS — K219 Gastro-esophageal reflux disease without esophagitis: Secondary | ICD-10-CM | POA: Diagnosis present

## 2017-04-26 DIAGNOSIS — M169 Osteoarthritis of hip, unspecified: Secondary | ICD-10-CM | POA: Diagnosis present

## 2017-04-26 DIAGNOSIS — Z87891 Personal history of nicotine dependence: Secondary | ICD-10-CM

## 2017-04-26 DIAGNOSIS — Z96641 Presence of right artificial hip joint: Secondary | ICD-10-CM | POA: Diagnosis not present

## 2017-04-26 DIAGNOSIS — Z471 Aftercare following joint replacement surgery: Secondary | ICD-10-CM | POA: Diagnosis not present

## 2017-04-26 DIAGNOSIS — M25551 Pain in right hip: Secondary | ICD-10-CM | POA: Diagnosis not present

## 2017-04-26 DIAGNOSIS — M810 Age-related osteoporosis without current pathological fracture: Secondary | ICD-10-CM | POA: Diagnosis present

## 2017-04-26 DIAGNOSIS — M1710 Unilateral primary osteoarthritis, unspecified knee: Secondary | ICD-10-CM | POA: Diagnosis not present

## 2017-04-26 DIAGNOSIS — Z96649 Presence of unspecified artificial hip joint: Secondary | ICD-10-CM

## 2017-04-26 DIAGNOSIS — N189 Chronic kidney disease, unspecified: Secondary | ICD-10-CM | POA: Diagnosis not present

## 2017-04-26 DIAGNOSIS — M1611 Unilateral primary osteoarthritis, right hip: Principal | ICD-10-CM | POA: Diagnosis present

## 2017-04-26 HISTORY — PX: TOTAL HIP ARTHROPLASTY: SHX124

## 2017-04-26 LAB — TYPE AND SCREEN
ABO/RH(D): O POS
ANTIBODY SCREEN: NEGATIVE

## 2017-04-26 SURGERY — ARTHROPLASTY, HIP, TOTAL, ANTERIOR APPROACH
Anesthesia: Spinal | Site: Hip | Laterality: Right

## 2017-04-26 MED ORDER — PHENOL 1.4 % MT LIQD: 1.0000 | OROMUCOSAL | Status: DC | PRN

## 2017-04-26 MED ORDER — ACETAMINOPHEN 650 MG RE SUPP: 650.0000 mg | Freq: Four times a day (QID) | RECTAL | Status: DC | PRN

## 2017-04-26 MED ORDER — ONDANSETRON HCL 4 MG/2ML IJ SOLN
INTRAMUSCULAR | Status: AC
  Filled 2017-04-26: qty 2

## 2017-04-26 MED ORDER — MIDAZOLAM HCL 2 MG/2ML IJ SOLN
INTRAMUSCULAR | Status: DC | PRN
  Administered 2017-04-26 (×2): 1 mg via INTRAVENOUS

## 2017-04-26 MED ORDER — MENTHOL 3 MG MT LOZG: 1.0000 | LOZENGE | OROMUCOSAL | Status: DC | PRN

## 2017-04-26 MED ORDER — ACETAMINOPHEN 325 MG PO TABS: 650.0000 mg | ORAL_TABLET | Freq: Four times a day (QID) | ORAL | Status: DC | PRN

## 2017-04-26 MED ORDER — ACETAMINOPHEN 10 MG/ML IV SOLN
INTRAVENOUS | Status: AC
  Filled 2017-04-26: qty 100

## 2017-04-26 MED ORDER — METOCLOPRAMIDE HCL 5 MG/ML IJ SOLN: 10.0000 mg | Freq: Once | INTRAMUSCULAR | Status: DC | PRN

## 2017-04-26 MED ORDER — METHOCARBAMOL 500 MG PO TABS: 500.0000 mg | ORAL_TABLET | Freq: Four times a day (QID) | ORAL | Status: DC | PRN

## 2017-04-26 MED ORDER — SODIUM CHLORIDE 0.9 % IR SOLN
Status: DC | PRN
  Administered 2017-04-26: 1000 mL

## 2017-04-26 MED ORDER — ONDANSETRON HCL 4 MG/2ML IJ SOLN
INTRAMUSCULAR | Status: DC | PRN
  Administered 2017-04-26: 4 mg via INTRAVENOUS

## 2017-04-26 MED ORDER — ACETAMINOPHEN 10 MG/ML IV SOLN
1000.0000 mg | Freq: Once | INTRAVENOUS | Status: AC
  Administered 2017-04-26: 1000 mg via INTRAVENOUS

## 2017-04-26 MED ORDER — ACETAMINOPHEN 500 MG PO TABS
1000.0000 mg | ORAL_TABLET | Freq: Four times a day (QID) | ORAL | Status: AC
  Administered 2017-04-26 – 2017-04-27 (×4): 1000 mg via ORAL
  Filled 2017-04-26 (×4): qty 2

## 2017-04-26 MED ORDER — FLEET ENEMA 7-19 GM/118ML RE ENEM: 1.0000 | ENEMA | Freq: Once | RECTAL | Status: DC | PRN

## 2017-04-26 MED ORDER — FENTANYL CITRATE (PF) 100 MCG/2ML IJ SOLN
INTRAMUSCULAR | Status: DC | PRN
  Administered 2017-04-26 (×2): 50 ug via INTRAVENOUS

## 2017-04-26 MED ORDER — DIPHENHYDRAMINE HCL 12.5 MG/5ML PO ELIX: 12.5000 mg | ORAL_SOLUTION | ORAL | Status: DC | PRN

## 2017-04-26 MED ORDER — BUPIVACAINE HCL (PF) 0.25 % IJ SOLN
INTRAMUSCULAR | Status: AC
  Filled 2017-04-26: qty 30

## 2017-04-26 MED ORDER — MEPERIDINE HCL 50 MG/ML IJ SOLN: 6.2500 mg | INTRAMUSCULAR | Status: DC | PRN

## 2017-04-26 MED ORDER — DEXAMETHASONE SODIUM PHOSPHATE 10 MG/ML IJ SOLN
INTRAMUSCULAR | Status: AC
  Filled 2017-04-26: qty 1

## 2017-04-26 MED ORDER — BUPIVACAINE IN DEXTROSE 0.75-8.25 % IT SOLN
INTRATHECAL | Status: DC | PRN
  Administered 2017-04-26: 1.5 mL via INTRATHECAL

## 2017-04-26 MED ORDER — EPHEDRINE SULFATE 50 MG/ML IJ SOLN
INTRAMUSCULAR | Status: DC | PRN
  Administered 2017-04-26: 5 mg via INTRAVENOUS
  Administered 2017-04-26: 10 mg via INTRAVENOUS
  Administered 2017-04-26 (×2): 5 mg via INTRAVENOUS

## 2017-04-26 MED ORDER — LACTATED RINGERS IV SOLN
INTRAVENOUS | Status: DC
  Administered 2017-04-26: 10:00:00 via INTRAVENOUS
  Administered 2017-04-26: 1000 mL via INTRAVENOUS

## 2017-04-26 MED ORDER — ONDANSETRON HCL 4 MG PO TABS: 4.0000 mg | ORAL_TABLET | Freq: Four times a day (QID) | ORAL | Status: DC | PRN

## 2017-04-26 MED ORDER — DARIFENACIN HYDROBROMIDE ER 7.5 MG PO TB24
7.5000 mg | ORAL_TABLET | Freq: Every day | ORAL | Status: DC
  Administered 2017-04-27: 11:00:00 7.5 mg via ORAL
  Filled 2017-04-26 (×2): qty 1

## 2017-04-26 MED ORDER — LACTATED RINGERS IV SOLN: INTRAVENOUS | Status: DC

## 2017-04-26 MED ORDER — CEFAZOLIN SODIUM-DEXTROSE 2-4 GM/100ML-% IV SOLN
INTRAVENOUS | Status: AC
  Filled 2017-04-26: qty 100

## 2017-04-26 MED ORDER — DEXTROSE 5 % IV SOLN
500.0000 mg | Freq: Four times a day (QID) | INTRAVENOUS | Status: DC | PRN
  Filled 2017-04-26: qty 5

## 2017-04-26 MED ORDER — CEFAZOLIN SODIUM-DEXTROSE 2-4 GM/100ML-% IV SOLN
2.0000 g | Freq: Four times a day (QID) | INTRAVENOUS | Status: AC
  Administered 2017-04-26 (×2): 2 g via INTRAVENOUS
  Filled 2017-04-26 (×2): qty 100

## 2017-04-26 MED ORDER — POLYETHYLENE GLYCOL 3350 17 G PO PACK: 17.0000 g | PACK | Freq: Every day | ORAL | Status: DC | PRN

## 2017-04-26 MED ORDER — OXYCODONE HCL 5 MG PO TABS
5.0000 mg | ORAL_TABLET | ORAL | Status: DC | PRN
  Filled 2017-04-26: qty 1

## 2017-04-26 MED ORDER — MIDAZOLAM HCL 2 MG/2ML IJ SOLN
INTRAMUSCULAR | Status: AC
  Filled 2017-04-26: qty 2

## 2017-04-26 MED ORDER — RIVAROXABAN 10 MG PO TABS
10.0000 mg | ORAL_TABLET | Freq: Every day | ORAL | Status: DC
  Administered 2017-04-27: 08:00:00 10 mg via ORAL
  Filled 2017-04-26: qty 1

## 2017-04-26 MED ORDER — METOCLOPRAMIDE HCL 5 MG PO TABS: 5.0000 mg | ORAL_TABLET | Freq: Three times a day (TID) | ORAL | Status: DC | PRN

## 2017-04-26 MED ORDER — SODIUM CHLORIDE 0.9 % IV SOLN
INTRAVENOUS | Status: DC
  Administered 2017-04-26 – 2017-04-27 (×2): via INTRAVENOUS

## 2017-04-26 MED ORDER — LIDOCAINE 2% (20 MG/ML) 5 ML SYRINGE
INTRAMUSCULAR | Status: DC | PRN
  Administered 2017-04-26: 50 mg via INTRAVENOUS

## 2017-04-26 MED ORDER — BUPIVACAINE HCL (PF) 0.25 % IJ SOLN
INTRAMUSCULAR | Status: DC | PRN
  Administered 2017-04-26: 30 mL

## 2017-04-26 MED ORDER — LIDOCAINE 2% (20 MG/ML) 5 ML SYRINGE
INTRAMUSCULAR | Status: AC
  Filled 2017-04-26: qty 5

## 2017-04-26 MED ORDER — CHLORHEXIDINE GLUCONATE 4 % EX LIQD: 60.0000 mL | Freq: Once | CUTANEOUS | Status: DC

## 2017-04-26 MED ORDER — FAMOTIDINE 20 MG PO TABS
40.0000 mg | ORAL_TABLET | Freq: Every day | ORAL | Status: DC
  Administered 2017-04-27: 11:00:00 40 mg via ORAL
  Filled 2017-04-26: qty 2

## 2017-04-26 MED ORDER — DEXAMETHASONE SODIUM PHOSPHATE 10 MG/ML IJ SOLN
10.0000 mg | Freq: Once | INTRAMUSCULAR | Status: AC
  Administered 2017-04-27: 11:00:00 10 mg via INTRAVENOUS
  Filled 2017-04-26: qty 1

## 2017-04-26 MED ORDER — ONDANSETRON HCL 4 MG/2ML IJ SOLN: 4.0000 mg | Freq: Four times a day (QID) | INTRAMUSCULAR | Status: DC | PRN

## 2017-04-26 MED ORDER — TRAMADOL HCL 50 MG PO TABS: 50.0000 mg | ORAL_TABLET | Freq: Four times a day (QID) | ORAL | Status: DC | PRN

## 2017-04-26 MED ORDER — DOCUSATE SODIUM 100 MG PO CAPS
100.0000 mg | ORAL_CAPSULE | Freq: Two times a day (BID) | ORAL | Status: DC
  Administered 2017-04-26: 100 mg via ORAL
  Filled 2017-04-26 (×2): qty 1

## 2017-04-26 MED ORDER — BISACODYL 10 MG RE SUPP: 10.0000 mg | Freq: Every day | RECTAL | Status: DC | PRN

## 2017-04-26 MED ORDER — CEFAZOLIN SODIUM-DEXTROSE 2-4 GM/100ML-% IV SOLN
2.0000 g | INTRAVENOUS | Status: AC
  Administered 2017-04-26: 2 g via INTRAVENOUS

## 2017-04-26 MED ORDER — TRANEXAMIC ACID 1000 MG/10ML IV SOLN
1000.0000 mg | Freq: Once | INTRAVENOUS | Status: AC
  Administered 2017-04-26: 15:00:00 1000 mg via INTRAVENOUS
  Filled 2017-04-26: qty 1100

## 2017-04-26 MED ORDER — MECLIZINE HCL 25 MG PO TABS: 25.0000 mg | ORAL_TABLET | Freq: Two times a day (BID) | ORAL | Status: DC | PRN

## 2017-04-26 MED ORDER — FENTANYL CITRATE (PF) 100 MCG/2ML IJ SOLN
INTRAMUSCULAR | Status: AC
  Filled 2017-04-26: qty 2

## 2017-04-26 MED ORDER — EPHEDRINE 5 MG/ML INJ
INTRAVENOUS | Status: AC
  Filled 2017-04-26: qty 10

## 2017-04-26 MED ORDER — TRANEXAMIC ACID 1000 MG/10ML IV SOLN
1000.0000 mg | INTRAVENOUS | Status: AC
  Administered 2017-04-26: 1000 mg via INTRAVENOUS
  Filled 2017-04-26: qty 1100

## 2017-04-26 MED ORDER — PROPOFOL 500 MG/50ML IV EMUL
INTRAVENOUS | Status: DC | PRN
  Administered 2017-04-26: 75 ug/kg/min via INTRAVENOUS

## 2017-04-26 MED ORDER — FENTANYL CITRATE (PF) 100 MCG/2ML IJ SOLN: 25.0000 ug | INTRAMUSCULAR | Status: DC | PRN

## 2017-04-26 MED ORDER — DEXAMETHASONE SODIUM PHOSPHATE 10 MG/ML IJ SOLN
10.0000 mg | Freq: Once | INTRAMUSCULAR | Status: AC
  Administered 2017-04-26: 10 mg via INTRAVENOUS

## 2017-04-26 MED ORDER — METOCLOPRAMIDE HCL 5 MG/ML IJ SOLN: 5.0000 mg | Freq: Three times a day (TID) | INTRAMUSCULAR | Status: DC | PRN

## 2017-04-26 SURGICAL SUPPLY — 36 items
BAG DECANTER FOR FLEXI CONT (MISCELLANEOUS) ×3 IMPLANT
BAG ZIPLOCK 12X15 (MISCELLANEOUS) IMPLANT
BLADE SAG 18X100X1.27 (BLADE) ×3 IMPLANT
CAPT HIP TOTAL 2 ×3 IMPLANT
CLOSURE WOUND 1/2 X4 (GAUZE/BANDAGES/DRESSINGS) ×1
CLOTH BEACON ORANGE TIMEOUT ST (SAFETY) ×3 IMPLANT
COVER PERINEAL POST (MISCELLANEOUS) ×3 IMPLANT
COVER SURGICAL LIGHT HANDLE (MISCELLANEOUS) ×3 IMPLANT
DECANTER SPIKE VIAL GLASS SM (MISCELLANEOUS) ×3 IMPLANT
DRAPE STERI IOBAN 125X83 (DRAPES) ×3 IMPLANT
DRAPE U-SHAPE 47X51 STRL (DRAPES) ×6 IMPLANT
DRSG ADAPTIC 3X8 NADH LF (GAUZE/BANDAGES/DRESSINGS) ×3 IMPLANT
DRSG MEPILEX BORDER 4X4 (GAUZE/BANDAGES/DRESSINGS) ×3 IMPLANT
DRSG MEPILEX BORDER 4X8 (GAUZE/BANDAGES/DRESSINGS) ×3 IMPLANT
DURAPREP 26ML APPLICATOR (WOUND CARE) ×3 IMPLANT
ELECT REM PT RETURN 15FT ADLT (MISCELLANEOUS) ×3 IMPLANT
EVACUATOR 1/8 PVC DRAIN (DRAIN) ×3 IMPLANT
GLOVE BIO SURGEON STRL SZ7.5 (GLOVE) ×3 IMPLANT
GLOVE BIO SURGEON STRL SZ8 (GLOVE) ×6 IMPLANT
GLOVE BIOGEL PI IND STRL 7.5 (GLOVE) ×4 IMPLANT
GLOVE BIOGEL PI IND STRL 8 (GLOVE) ×2 IMPLANT
GLOVE BIOGEL PI INDICATOR 7.5 (GLOVE) ×8
GLOVE BIOGEL PI INDICATOR 8 (GLOVE) ×4
GOWN STRL REUS W/TWL LRG LVL3 (GOWN DISPOSABLE) ×3 IMPLANT
GOWN STRL REUS W/TWL XL LVL3 (GOWN DISPOSABLE) ×9 IMPLANT
PACK ANTERIOR HIP CUSTOM (KITS) ×3 IMPLANT
STRIP CLOSURE SKIN 1/2X4 (GAUZE/BANDAGES/DRESSINGS) ×2 IMPLANT
SUT ETHIBOND NAB CT1 #1 30IN (SUTURE) ×3 IMPLANT
SUT MNCRL AB 4-0 PS2 18 (SUTURE) ×3 IMPLANT
SUT STRATAFIX 0 PDS 27 VIOLET (SUTURE) ×3
SUT VIC AB 2-0 CT1 27 (SUTURE) ×4
SUT VIC AB 2-0 CT1 TAPERPNT 27 (SUTURE) ×2 IMPLANT
SUTURE STRATFX 0 PDS 27 VIOLET (SUTURE) ×1 IMPLANT
SYR 50ML LL SCALE MARK (SYRINGE) IMPLANT
TRAY FOLEY W/METER SILVER 16FR (SET/KITS/TRAYS/PACK) ×3 IMPLANT
YANKAUER SUCT BULB TIP 10FT TU (MISCELLANEOUS) ×3 IMPLANT

## 2017-04-26 NOTE — Transfer of Care (Signed)
Immediate Anesthesia Transfer of Care Note  Patient: Melissa Villanueva  Procedure(s) Performed: Procedure(s): RIGHT TOTAL HIP ARTHROPLASTY ANTERIOR APPROACH (Right)  Patient Location: PACU  Anesthesia Type:MAC and Spinal  Level of Consciousness: awake, alert , oriented and patient cooperative  Airway & Oxygen Therapy: Patient Spontanous Breathing and Patient connected to face mask oxygen  Post-op Assessment: Report given to RN and Post -op Vital signs reviewed and stable  Post vital signs: Reviewed and stable  Last Vitals:  Vitals:   04/26/17 0609  BP: 139/75  Pulse: 74  Resp: 16  Temp: 36.9 C    Last Pain:  Vitals:   04/26/17 0609  TempSrc: Oral      Patients Stated Pain Goal: 4 (75/43/60 6770)  Complications: No apparent anesthesia complications

## 2017-04-26 NOTE — Anesthesia Procedure Notes (Signed)
Spinal  Patient location during procedure: OR Staffing Anesthesiologist: [name] Performed: anesthesiologist  Preanesthetic Checklist Completed: patient identified, site marked, surgical consent, pre-op evaluation, timeout performed, IV checked, risks and benefits discussed and monitors and equipment checked Spinal Block Patient position: sitting Prep: DuraPrep Patient monitoring: heart rate, continuous pulse ox and blood pressure Approach: right paramedian Location: L3-4 Injection technique: single-shot Needle Needle type: Whitacre  Needle gauge: 22 G Needle length: 9 cm Additional Notes Expiration date of kit checked and confirmed. Patient tolerated procedure well, without complications.

## 2017-04-26 NOTE — Progress Notes (Signed)
Pt tolerating regular diet, up with PT and did well.  Hemovac came out when moving patient from chair back to bed at 1800.00 No complaints of pain. Resting comfortably.

## 2017-04-26 NOTE — Progress Notes (Signed)
Lives in a 1 story home with 1 steps. No DME needs.Scheduled to start OP PT at [facility] on 05-01-17. [phone]

## 2017-04-26 NOTE — Op Note (Signed)
OPERATIVE REPORT- TOTAL HIP ARTHROPLASTY   PREOPERATIVE DIAGNOSIS: Osteoarthritis of the Right hip.   POSTOPERATIVE DIAGNOSIS: Osteoarthritis of the Right  hip.   PROCEDURE: Right total hip arthroplasty, anterior approach.   SURGEON: Gaynelle Arabian, MD   ASSISTANT: Arlee Muslim, PA-C  ANESTHESIA:  Spinal  ESTIMATED BLOOD LOSS:-550 ml   DRAINS: Hemovac x1.   COMPLICATIONS: None   CONDITION: PACU - hemodynamically stable.   BRIEF CLINICAL NOTE: Melissa Villanueva is a 71 y.o. female who has advanced end-  stage arthritis of their Right  hip with progressively worsening pain and  dysfunction.The patient has failed nonoperative management and presents for  total hip arthroplasty.   PROCEDURE IN DETAIL: After successful administration of spinal  anesthetic, the traction boots for the [facility] bed were placed on both  feet and the patient was placed onto the [facility] bed, boots placed into the leg  holders. The Right hip was then isolated from the perineum with plastic  drapes and prepped and draped in the usual sterile fashion. ASIS and  greater trochanter were marked and a oblique incision was made, starting  at about 1 cm lateral and 2 cm distal to the ASIS and coursing towards  the anterior cortex of the femur. The skin was cut with a 10 blade  through subcutaneous tissue to the level of the fascia overlying the  tensor fascia lata muscle. The fascia was then incised in line with the  incision at the junction of the anterior third and posterior 2/3rd. The  muscle was teased off the fascia and then the interval between the TFL  and the rectus was developed. The Hohmann retractor was then placed at  the top of the femoral neck over the capsule. The vessels overlying the  capsule were cauterized and the fat on top of the capsule was removed.  A Hohmann retractor was then placed anterior underneath the rectus  femoris to give exposure to the entire anterior capsule. A T-shaped   capsulotomy was performed. The edges were tagged and the femoral head  was identified.       Osteophytes are removed off the superior acetabulum.  The femoral neck was then cut in situ with an oscillating saw. Traction  was then applied to the left lower extremity utilizing the [facility]  traction. The femoral head was then removed. Retractors were placed  around the acetabulum and then circumferential removal of the labrum was  performed. Osteophytes were also removed. Reaming starts at 45 mm to  medialize and  Increased in 2 mm increments to 49 mm. We reamed in  approximately 40 degrees of abduction, 20 degrees anteversion. A 50 mm  pinnacle acetabular shell was then impacted in anatomic position under  fluoroscopic guidance with excellent purchase. We did not need to place  any additional dome screws. A 32 mm neutral + 4 marathon liner was then  placed into the acetabular shell.       The femoral lift was then placed along the lateral aspect of the femur  just distal to the vastus ridge. The leg was  externally rotated and capsule  was stripped off the inferior aspect of the femoral neck down to the  level of the lesser trochanter, this was done with electrocautery. The femur was lifted after this was performed. The  leg was then placed in an extended and adducted position essentially delivering the femur. We also removed the capsule superiorly and the piriformis from the piriformis fossa  to gain excellent exposure of the  proximal femur. Rongeur was used to remove some cancellous bone to get  into the lateral portion of the proximal femur for placement of the  initial starter reamer. The starter broaches was placed  the starter broach  and was shown to go down the center of the canal. Broaching  with the  Corail system was then performed starting at size 8, coursing  Up to size 12. A size 12 had excellent torsional and rotational  and axial stability. The trial high offset neck was then  placed  with a 32 + 1 trial head. The hip was then reduced. We confirmed that  the stem was in the canal both on AP and lateral x-rays. It also has excellent sizing. The hip was reduced with outstanding stability through full extension and full external rotation.. AP pelvis was taken and the leg lengths were measured and found to be equal. Hip was then dislocated again and the femoral head and neck removed. The  femoral broach was removed. Size 12 Corail stem with a high offset  neck was then impacted into the femur following native anteversion. Has  excellent purchase in the canal. Excellent torsional and rotational and  axial stability. It is confirmed to be in the canal on AP and lateral  fluoroscopic views. The 32 + 1 ceramic head was placed and the hip  reduced with outstanding stability. Again AP pelvis was taken and it  confirmed that the leg lengths were equal. The wound was then copiously  irrigated with saline solution and the capsule reattached and repaired  with Ethibond suture. 30 ml of .25% Bupivicaine was  injected into the capsule and into the edge of the tensor fascia lata as well as subcutaneous tissue. The fascia overlying the tensor fascia lata was then closed with a running #1 V-Loc. Subcu was closed with interrupted 2-0 Vicryl and subcuticular running 4-0 Monocryl. Incision was cleaned  and dried. Steri-Strips and a bulky sterile dressing applied. Hemovac  drain was hooked to suction and then the patient was awakened and transported to  recovery in stable condition.        Please note that a surgical assistant was a medical necessity for this procedure to perform it in a safe and expeditious manner. Assistant was necessary to provide appropriate retraction of vital neurovascular structures and to prevent femoral fracture and allow for anatomic placement of the prosthesis.  Gaynelle Arabian, M.D.

## 2017-04-26 NOTE — Interval H&P Note (Signed)
History and Physical Interval Note:  04/26/2017 7:19 AM  Melissa Villanueva  has presented today for surgery, with the diagnosis of Osteoarthritis Right Hip  The various methods of treatment have been discussed with the patient and family. After consideration of risks, benefits and other options for treatment, the patient has consented to  Procedure(s): RIGHT TOTAL HIP ARTHROPLASTY ANTERIOR APPROACH (Right) as a surgical intervention .  The patient's history has been reviewed, patient examined, no change in status, stable for surgery.  I have reviewed the patient's chart and labs.  Questions were answered to the patient's satisfaction.     Gearlean Alf

## 2017-04-26 NOTE — Anesthesia Postprocedure Evaluation (Signed)
Anesthesia Post Note  Patient: Melissa Villanueva  Procedure(s) Performed: Procedure(s) (LRB): RIGHT TOTAL HIP ARTHROPLASTY ANTERIOR APPROACH (Right)  Patient location during evaluation: PACU Anesthesia Type: Spinal Level of consciousness: awake and alert Pain management: pain level controlled Vital Signs Assessment: post-procedure vital signs reviewed and stable Respiratory status: spontaneous breathing, nonlabored ventilation, respiratory function stable and patient connected to nasal cannula oxygen Cardiovascular status: blood pressure returned to baseline and stable Postop Assessment: no signs of nausea or vomiting Anesthetic complications: no       Last Vitals:  Vitals:   04/26/17 1300 04/26/17 1400  BP: (!) 113/48 (!) 129/55  Pulse: 61 64  Resp: 17 16  Temp: 36.5 C 36.6 C    Last Pain:  Vitals:   04/26/17 1400  TempSrc: Oral  PainSc:                  [name]

## 2017-04-26 NOTE — Anesthesia Procedure Notes (Signed)
Procedure Name: MAC Date/Time: 04/26/2017 8:58 AM Performed by: Dione Booze Pre-anesthesia Checklist: Patient identified, Emergency Drugs available, Suction available and Patient being monitored Patient Re-evaluated:Patient Re-evaluated prior to inductionOxygen Delivery Method: Simple face mask Placement Confirmation: positive ETCO2

## 2017-04-26 NOTE — Evaluation (Signed)
Physical Therapy Evaluation Patient Details Name: Melissa Villanueva MRN: [id number] DOB: 12/16/1945 Today's Date: 04/26/2017   History of Present Illness  71 yo female s/p R THA-direct anterior 04/26/17  Clinical Impression  On eval POD 0, pt required Min assist for mobility. She walked ~115 feet with a RW. No increase in pain with activity. Will follow and progress activity as tolerated. Pt is planning to d/c 5/31.    Follow Up Recommendations Outpatient PT    Equipment Recommendations  None recommended by PT    Recommendations for Other Services       Precautions / Restrictions Precautions Precautions: Fall Restrictions Weight Bearing Restrictions: No RLE Weight Bearing: Weight bearing as tolerated      Mobility  Bed Mobility Overal bed mobility: Needs Assistance Bed Mobility: Supine to Sit     Supine to sit: Min assist;HOB elevated     General bed mobility comments: Assist for R LE. Increased time. Moves slowly due to hx of vertigo.   Transfers Overall transfer level: Needs assistance Equipment used: Rolling walker (2 wheeled) Transfers: Sit to/from Stand Sit to Stand: Min assist         General transfer comment: Assist to rise, stabilize, control descent. VCs safety, hand placement.   Ambulation/Gait Ambulation/Gait assistance: Min assist Ambulation Distance (Feet): 115 Feet Assistive device: Rolling walker (2 wheeled) Gait Pattern/deviations: Step-through pattern;Decreased stride length     General Gait Details: Assist to stabilize.   Stairs            Wheelchair Mobility    Modified Rankin (Stroke Patients Only)       Balance                                             Pertinent Vitals/Pain Pain Assessment: 0-10 Pain Score: 4  Pain Location: R hip Pain Descriptors / Indicators: Sore Pain Intervention(s): Monitored during session;Repositioned    Home Living Family/patient expects to be discharged to:: Private  residence Living Arrangements: Alone Available Help at Discharge: Family (son ~1 week) Type of Home: House Home Access: Stairs to enter Entrance Stairs-Rails: None Technical brewer of Steps: 1 Home Layout: One level Home Equipment: Environmental consultant - 2 wheels      Prior Function Level of Independence: Independent               Hand Dominance        Extremity/Trunk Assessment   Upper Extremity Assessment Upper Extremity Assessment: Defer to OT evaluation    Lower Extremity Assessment Lower Extremity Assessment: Generalized weakness (s/p R THA)    Cervical / Trunk Assessment Cervical / Trunk Assessment: Normal  Communication   Communication: No difficulties  Cognition Arousal/Alertness: Awake/alert Behavior During Therapy: WFL for tasks assessed/performed Overall Cognitive Status: Within Functional Limits for tasks assessed                                        General Comments      Exercises     Assessment/Plan    PT Assessment Patient needs continued PT services  PT Problem List Decreased strength;Decreased mobility;Decreased activity tolerance;Decreased balance;Decreased knowledge of use of DME;Pain       PT Treatment Interventions DME instruction;Therapeutic activities;Gait training;Therapeutic exercise;Patient/family education;Functional mobility training;Stair training;Balance training    PT Goals (  Current goals can be found in the Care Plan section)  Acute Rehab PT Goals Patient Stated Goal: home PT Goal Formulation: With patient/family Time For Goal Achievement: 05/10/17 Potential to Achieve Goals: Good    Frequency 7X/week   Barriers to discharge        Co-evaluation               AM-PAC PT "6 Clicks" Daily Activity  Outcome Measure Difficulty turning over in bed (including adjusting bedclothes, sheets and blankets)?: A Little Difficulty moving from lying on back to sitting on the side of the bed? : A  Little Difficulty sitting down on and standing up from a chair with arms (e.g., wheelchair, bedside commode, etc,.)?: A Little Help needed moving to and from a bed to chair (including a wheelchair)?: A Little Help needed walking in hospital room?: A Little Help needed climbing 3-5 steps with a railing? : A Little 6 Click Score: 18    End of Session Equipment Utilized During Treatment: Gait belt Activity Tolerance: Patient tolerated treatment well Patient left: in chair;with call bell/phone within reach;with family/visitor present   PT Visit Diagnosis: Muscle weakness (generalized) (M62.81);Difficulty in walking, not elsewhere classified (R26.2)    Time: 4718-5501 PT Time Calculation (min) (ACUTE ONLY): 15 min   Charges:   PT Evaluation $PT Eval Low Complexity: 1 Procedure     PT G Codes:          [name], MPT Pager: [phone]

## 2017-04-26 NOTE — Anesthesia Preprocedure Evaluation (Signed)
Anesthesia Evaluation  Patient identified by MRN, date of birth, ID band Patient awake    Reviewed: Allergy & Precautions, NPO status , Patient's Chart, lab work & pertinent test results  History of Anesthesia Complications (+) PONV  Airway Mallampati: II  TM Distance: >3 FB Neck ROM: Full    Dental no notable dental hx. (+) Upper Dentures   Pulmonary neg pulmonary ROS, former smoker,    Pulmonary exam normal breath sounds clear to auscultation       Cardiovascular negative cardio ROS Normal cardiovascular exam Rhythm:Regular Rate:Normal     Neuro/Psych negative neurological ROS  negative psych ROS   GI/Hepatic negative GI ROS, Neg liver ROS,   Endo/Other  negative endocrine ROS  Renal/GU negative Renal ROS  negative genitourinary   Musculoskeletal negative musculoskeletal ROS (+)   Abdominal   Peds negative pediatric ROS (+)  Hematology negative hematology ROS (+)   Anesthesia Other Findings   Reproductive/Obstetrics negative OB ROS                             Anesthesia Physical Anesthesia Plan  ASA: II  Anesthesia Plan: Spinal   Post-op Pain Management:    Induction:   Airway Management Planned: Simple Face Mask  Additional Equipment:   Intra-op Plan:   Post-operative Plan:   Informed Consent: I have reviewed the patients History and Physical, chart, labs and discussed the procedure including the risks, benefits and alternatives for the proposed anesthesia with the patient or authorized representative who has indicated his/her understanding and acceptance.   Dental advisory given  Plan Discussed with: CRNA  Anesthesia Plan Comments:         Anesthesia Quick Evaluation

## 2017-04-27 LAB — CBC
HEMATOCRIT: 26.1 % — AB (ref 36.0–46.0)
HEMOGLOBIN: 8.7 g/dL — AB (ref 12.0–15.0)
MCH: 30.2 pg (ref 26.0–34.0)
MCHC: 33.3 g/dL (ref 30.0–36.0)
MCV: 90.6 fL (ref 78.0–100.0)
Platelets: 132 10*3/uL — ABNORMAL LOW (ref 150–400)
RBC: 2.88 MIL/uL — ABNORMAL LOW (ref 3.87–5.11)
RDW: 17.2 % — ABNORMAL HIGH (ref 11.5–15.5)
WBC: 10.1 10*3/uL (ref 4.0–10.5)

## 2017-04-27 LAB — BASIC METABOLIC PANEL
Anion gap: 4 — ABNORMAL LOW (ref 5–15)
BUN: 10 mg/dL (ref 6–20)
CHLORIDE: 110 mmol/L (ref 101–111)
CO2: 27 mmol/L (ref 22–32)
CREATININE: 0.66 mg/dL (ref 0.44–1.00)
Calcium: 8.4 mg/dL — ABNORMAL LOW (ref 8.9–10.3)
GFR calc non Af Amer: >60 mL/min (ref >60)
GLUCOSE: 129 mg/dL — AB (ref 65–99)
Potassium: 3.8 mmol/L (ref 3.5–5.1)
Sodium: 141 mmol/L (ref 135–145)

## 2017-04-27 MED ORDER — POLYSACCHARIDE IRON COMPLEX 150 MG PO CAPS
150.0000 mg | ORAL_CAPSULE | Freq: Two times a day (BID) | ORAL | Status: DC
  Administered 2017-04-27: 11:00:00 150 mg via ORAL
  Filled 2017-04-27: qty 1

## 2017-04-27 MED ORDER — TRAMADOL HCL 50 MG PO TABS: 50.0000 mg | ORAL_TABLET | Freq: Four times a day (QID) | ORAL | 0 refills | Status: DC | PRN

## 2017-04-27 MED ORDER — SODIUM CHLORIDE 0.9 % IV BOLUS (SEPSIS)
250.0000 mL | Freq: Once | INTRAVENOUS | Status: DC
  Administered 2017-04-27: 250 mL via INTRAVENOUS

## 2017-04-27 MED ORDER — POLYSACCHARIDE IRON COMPLEX 150 MG PO CAPS: 150.0000 mg | ORAL_CAPSULE | Freq: Two times a day (BID) | ORAL | 0 refills | Status: DC

## 2017-04-27 MED ORDER — RIVAROXABAN 10 MG PO TABS: 10.0000 mg | ORAL_TABLET | Freq: Every day | ORAL | 0 refills | Status: DC

## 2017-04-27 NOTE — Discharge Summary (Signed)
Physician Discharge Summary   Patient ID: Melissa Villanueva MRN: [id number] DOB/AGE: June 11, 1946 71 y.o.  Admit date: 04/26/2017 Discharge date: 04/27/2017  Primary Diagnosis:  Osteoarthritis of the Right hip.  Admission Diagnoses:  Past Medical History:  Diagnosis Date  . Arthritis   . Dizziness   . GERD (gastroesophageal reflux disease)   . Headache   . Pancytopenia ([place]) 2016  . PONV (postoperative nausea and vomiting)   . Stress incontinence    Discharge Diagnoses:   Principal Problem:   OA (osteoarthritis) of hip  Estimated body mass index is 31.93 kg/m as calculated from the following:   Height as of this encounter: _0  (1.549 m).   Weight as of this encounter: 76.7 kg (169 lb).  Procedure(s) (LRB): RIGHT TOTAL HIP ARTHROPLASTY ANTERIOR APPROACH (Right)   Consults: None  HPI: Melissa Villanueva is a 71 y.o. female who has advanced end-  stage arthritis of their Right  hip with progressively worsening pain and  dysfunction.The patient has failed nonoperative management and presents for  total hip arthroplasty.  Laboratory Data: Admission on 04/26/2017  Component Date Value Ref Range Status  . WBC 04/27/2017 10.1  4.0 - 10.5 K/uL Final  . RBC 04/27/2017 2.88* 3.87 - 5.11 MIL/uL Final  . Hemoglobin 04/27/2017 8.7* 12.0 - 15.0 g/dL Final  . HCT 04/27/2017 26.1* 36.0 - 46.0 % Final  . MCV 04/27/2017 90.6  78.0 - 100.0 fL Final  . MCH 04/27/2017 30.2  26.0 - 34.0 pg Final  . MCHC 04/27/2017 33.3  30.0 - 36.0 g/dL Final  . RDW 04/27/2017 17.2* 11.5 - 15.5 % Final  . Platelets 04/27/2017 132* 150 - 400 K/uL Final  . Sodium 04/27/2017 141  135 - 145 mmol/L Final  . Potassium 04/27/2017 3.8  3.5 - 5.1 mmol/L Final  . Chloride 04/27/2017 110  101 - 111 mmol/L Final  . CO2 04/27/2017 27  22 - 32 mmol/L Final  . Glucose, Bld 04/27/2017 129* 65 - 99 mg/dL Final  . BUN 04/27/2017 10  6 - 20 mg/dL Final  . Creatinine, Ser 04/27/2017 0.66  0.44 - 1.00 mg/dL Final  .  Calcium 04/27/2017 8.4* 8.9 - 10.3 mg/dL Final  . GFR calc non Af Amer 04/27/2017 >60  >60 mL/min Final  . GFR calc Af Amer 04/27/2017 >60  >60 mL/min Final   Comment: (NOTE) The eGFR has been calculated using the CKD EPI equation. This calculation has not been validated in all clinical situations. eGFR's persistently <60 mL/min signify possible Chronic Kidney Disease.   Georgiann Hahn gap 04/27/2017 4* 5 - 15 Final  Hospital Outpatient Visit on 04/19/2017  Component Date Value Ref Range Status  . aPTT 04/19/2017 34  24 - 36 seconds Final  . WBC 04/19/2017 6.0  4.0 - 10.5 K/uL Final  . RBC 04/19/2017 4.03  3.87 - 5.11 MIL/uL Final  . Hemoglobin 04/19/2017 11.4* 12.0 - 15.0 g/dL Final  . HCT 04/19/2017 36.7  36.0 - 46.0 % Final  . MCV 04/19/2017 91.1  78.0 - 100.0 fL Final  . MCH 04/19/2017 28.3  26.0 - 34.0 pg Final  . MCHC 04/19/2017 31.1  30.0 - 36.0 g/dL Final  . RDW 04/19/2017 17.0* 11.5 - 15.5 % Final  . Platelets 04/19/2017 185  150 - 400 K/uL Final  . Sodium 04/19/2017 141  135 - 145 mmol/L Final  . Potassium 04/19/2017 4.4  3.5 - 5.1 mmol/L Final  . Chloride 04/19/2017 106  101 - 111  mmol/L Final  . CO2 04/19/2017 30  22 - 32 mmol/L Final  . Glucose, Bld 04/19/2017 94  65 - 99 mg/dL Final  . BUN 04/19/2017 13  6 - 20 mg/dL Final  . Creatinine, Ser 04/19/2017 0.77  0.44 - 1.00 mg/dL Final  . Calcium 04/19/2017 10.0  8.9 - 10.3 mg/dL Final  . Total Protein 04/19/2017 7.6  6.5 - 8.1 g/dL Final  . Albumin 04/19/2017 4.1  3.5 - 5.0 g/dL Final  . AST 04/19/2017 19  15 - 41 U/L Final  . ALT 04/19/2017 10* 14 - 54 U/L Final  . Alkaline Phosphatase 04/19/2017 54  38 - 126 U/L Final  . Total Bilirubin 04/19/2017 0.6  0.3 - 1.2 mg/dL Final  . GFR calc non Af Amer 04/19/2017 >60  >60 mL/min Final  . GFR calc Af Amer 04/19/2017 >60  >60 mL/min Final   Comment: (NOTE) The eGFR has been calculated using the CKD EPI equation. This calculation has not been validated in all clinical  situations. eGFR's persistently <60 mL/min signify possible Chronic Kidney Disease.   . Anion gap 04/19/2017 5  5 - 15 Final  . Prothrombin Time 04/19/2017 13.7  11.4 - 15.2 seconds Final  . INR 04/19/2017 1.05   Final  . ABO/RH(D) 04/19/2017 O POS   Final  . Antibody Screen 04/19/2017 NEG   Final  . Sample Expiration 04/19/2017 04/29/2017   Final  . Extend sample reason 04/19/2017 NO TRANSFUSIONS OR PREGNANCY IN THE PAST 3 MONTHS   Final  . MRSA, PCR 04/19/2017 NEGATIVE  NEGATIVE Final  . Staphylococcus aureus 04/19/2017 POSITIVE* NEGATIVE Final   Comment:        The Xpert SA Assay (FDA approved for NASAL specimens in patients over 36 years of age), is one component of a comprehensive surveillance program.  Test performance has been validated by [facility] for patients greater than or equal to 36 year old. It is not intended to diagnose infection nor to guide or monitor treatment.      X-Rays:Dg Pelvis Portable  Result Date: 04/26/2017 CLINICAL DATA:  Status post right hip replacement EXAM: PORTABLE PELVIS 1-2 VIEWS COMPARISON:  None. FINDINGS: New total right hip arthroplasty with drain on the right. No evidence of periprosthetic fracture. A small minority of the greater trochanter is excluded from view. Both hips are located. IMPRESSION: 1. No acute finding after total hip arthroplasty. 2. Minimal greater trochanter is excluded from view on the postoperative right side. Electronically Signed   By: Monte Fantasia M.D.   On: 04/26/2017 10:59   Dg C-arm 1-60 Min  Result Date: 04/26/2017 CLINICAL DATA:  Right total hip replacement. EXAM: DG C-ARM 61-120 MIN FLUOROSCOPY TIME:  0.1 minutes IMPRESSION: Fluoroscopy performed for right total hip replacement. Electronically Signed   By: Lorriane Shire M.D.   On: 04/26/2017 09:55    EKG:No orders found for this or any previous visit.   Hospital Course: Patient was admitted to [facility] and taken to the OR and underwent the  above state procedure without complications.  Patient tolerated the procedure well and was later transferred to the recovery room and then to the orthopaedic floor for postoperative care.  They were given PO and IV analgesics for pain control following their surgery.  They were given 24 hours of postoperative antibiotics of  Anti-infectives    Start     Dose/Rate Route Frequency Ordered Stop   04/26/17 1500  ceFAZolin (ANCEF) IVPB 2g/100 mL premix  2 g 200 mL/hr over 30 Minutes Intravenous Every 6 hours 04/26/17 1213 04/26/17 2035   04/26/17 0613  ceFAZolin (ANCEF) 2-4 GM/100ML-% IVPB    Comments:  [place] Session   : cabinet override      04/26/17 [phone] 04/26/17 1829   04/26/17 0611  ceFAZolin (ANCEF) IVPB 2g/100 mL premix     2 g 200 mL/hr over 30 Minutes Intravenous On call to O.R. 04/26/17 2694 04/26/17 0908     and started on DVT prophylaxis in the form of Xarelto.   PT and OT were ordered for total hip protocol.  The patient was allowed to be WBAT with therapy. Discharge planning was consulted to help with postop disposition and equipment needs.  Patient had a good night on the evening of surgery and walked 115 feet.  They started to get up OOB with therapy on day one.  Hemovac drain was pulled without difficulty. Patient was seen in rounds on POD 1 and was ready to go home that same day.   DC Meds - Tramadol and Xarelto Diet As Tolerated F/U in two weeks Disposition - Home COD - good  Discharge Instructions    Call MD / Call 911    Complete by:  As directed    If you experience chest pain or shortness of breath, CALL 911 and be transported to the hospital emergency room.  If you develope a fever above 101 F, pus (white drainage) or increased drainage or redness at the wound, or calf pain, call your surgeon's office.   Change dressing    Complete by:  As directed    You may change your dressing dressing daily with sterile 4 x 4 inch gauze dressing and paper tape.  Do not submerge  the incision under water.   Constipation Prevention    Complete by:  As directed    Drink plenty of fluids.  Prune juice may be helpful.  You may use a stool softener, such as Colace (over the counter) 100 mg twice a day.  Use MiraLax (over the counter) for constipation as needed.   Diet general    Complete by:  As directed    Discharge instructions    Complete by:  As directed    Take Xarelto for two and a half more weeks, then discontinue Xarelto. Once the patient has completed the Xarelto, they may resume the 81 mg Aspirin.   Pick up stool softner and laxative for home use following surgery while on pain medications. Do not submerge incision under water. Please use good hand washing techniques while changing dressing each day. May shower starting three days after surgery. Please use a clean towel to pat the incision dry following showers. Continue to use ice for pain and swelling after surgery. Do not use any lotions or creams on the incision until instructed by your surgeon.  Wear both TED hose on both legs during the day every day for three weeks, but may remove the TED hose at night at home.  Postoperative Constipation Protocol  Constipation - defined medically as fewer than three stools per week and severe constipation as less than one stool per week.  One of the most common issues patients have following surgery is constipation.  Even if you have a regular bowel pattern at home, your normal regimen is likely to be disrupted due to multiple reasons following surgery.  Combination of anesthesia, postoperative narcotics, change in appetite and fluid intake all can affect your bowels.  In  order to avoid complications following surgery, here are some recommendations in order to help you during your recovery period.  Colace (docusate) - Pick up an over-the-counter form of Colace or another stool softener and take twice a day as long as you are requiring postoperative pain medications.   Take with a full glass of water daily.  If you experience loose stools or diarrhea, hold the colace until you stool forms back up.  If your symptoms do not get better within 1 week or if they get worse, check with your doctor.  Dulcolax (bisacodyl) - Pick up over-the-counter and take as directed by the product packaging as needed to assist with the movement of your bowels.  Take with a full glass of water.  Use this product as needed if not relieved by Colace only.   MiraLax (polyethylene glycol) - Pick up over-the-counter to have on hand.  MiraLax is a solution that will increase the amount of water in your bowels to assist with bowel movements.  Take as directed and can mix with a glass of water, juice, soda, coffee, or tea.  Take if you go more than two days without a movement. Do not use MiraLax more than once per day. Call your doctor if you are still constipated or irregular after using this medication for 7 days in a row.  If you continue to have problems with postoperative constipation, please contact the office for further assistance and recommendations.  If you experience "the worst abdominal pain ever" or develop nausea or vomiting, please contact the office immediatly for further recommendations for treatment.   Do not sit on low chairs, stoools or toilet seats, as it may be difficult to get up from low surfaces    Complete by:  As directed    Driving restrictions    Complete by:  As directed    No driving until released by the physician.   Increase activity slowly as tolerated    Complete by:  As directed    Lifting restrictions    Complete by:  As directed    No lifting until released by the physician.   Patient may shower    Complete by:  As directed    You may shower without a dressing once there is no drainage.  Do not wash over the wound.  If drainage remains, do not shower until drainage stops.   TED hose    Complete by:  As directed    Use stockings (TED hose) for 3 weeks  on both leg(s).  You may remove them at night for sleeping.   Weight bearing as tolerated    Complete by:  As directed       Follow-up Information    Gaynelle Arabian, MD. Schedule an appointment as soon as possible for a visit on 05/09/2017.   Specialty:  Orthopedic Surgery Contact information: [address] [place] [address] [phone]           Signed: Arlee Muslim, PA-C Orthopaedic Surgery 04/27/2017, 8:08 AM

## 2017-04-27 NOTE — Progress Notes (Signed)
Physical Therapy Treatment Patient Details Name: Melissa Villanueva MRN: [id number] DOB: 1946-02-07 Today's Date: 04/27/2017    History of Present Illness 71 yo female s/p R THA-direct anterior 04/26/17    PT Comments    Progressing with mobility. Plan is for d/c later today. Will have a 2nd session to practice stairs.    Follow Up Recommendations  Outpatient PT     Equipment Recommendations  None recommended by PT    Recommendations for Other Services       Precautions / Restrictions Precautions Precautions: Fall Restrictions Weight Bearing Restrictions: No RLE Weight Bearing: Weight bearing as tolerated    Mobility  Bed Mobility               General bed mobility comments: oob in recliner  Transfers Overall transfer level: Needs assistance Equipment used: Rolling walker (2 wheeled) Transfers: Sit to/from Stand Sit to Stand: Min guard         General transfer comment: Close guard for safety.   Ambulation/Gait Ambulation/Gait assistance: Min guard Ambulation Distance (Feet): 125 Feet Assistive device: Rolling walker (2 wheeled) Gait Pattern/deviations: Step-through pattern;Decreased stride length     General Gait Details: Close guard for safety.    Stairs            Wheelchair Mobility    Modified Rankin (Stroke Patients Only)       Balance                                            Cognition Arousal/Alertness: Awake/alert Behavior During Therapy: WFL for tasks assessed/performed Overall Cognitive Status: Within Functional Limits for tasks assessed                                        Exercises      General Comments        Pertinent Vitals/Pain Pain Assessment: 0-10 Pain Score: 4  Pain Location: R hip Pain Descriptors / Indicators: Sore Pain Intervention(s): Monitored during session;Repositioned    Home Living                      Prior Function            PT Goals  (current goals can now be found in the care plan section) Progress towards PT goals: Progressing toward goals    Frequency           PT Plan Current plan remains appropriate    Co-evaluation              AM-PAC PT "6 Clicks" Daily Activity  Outcome Measure  Difficulty turning over in bed (including adjusting bedclothes, sheets and blankets)?: A Little Difficulty moving from lying on back to sitting on the side of the bed? : A Little Difficulty sitting down on and standing up from a chair with arms (e.g., wheelchair, bedside commode, etc,.)?: A Little Help needed moving to and from a bed to chair (including a wheelchair)?: A Little Help needed walking in hospital room?: A Little Help needed climbing 3-5 steps with a railing? : A Little 6 Click Score: 18    End of Session Equipment Utilized During Treatment: Gait belt Activity Tolerance: Patient tolerated treatment well Patient left: in chair;with call bell/phone within reach;with family/visitor present  PT Visit Diagnosis: Muscle weakness (generalized) (M62.81);Difficulty in walking, not elsewhere classified (R26.2)     Time: 0240-9735 PT Time Calculation (min) (ACUTE ONLY): 17 min  Charges:  $Gait Training: 8-22 mins                    G Codes:          [name], MPT Pager: [phone]

## 2017-04-27 NOTE — Progress Notes (Signed)
Physical Therapy Treatment Patient Details Name: Melissa Villanueva MRN: [id number] DOB: 1946-01-08 Today's Date: 04/27/2017    History of Present Illness 71 yo female s/p R THA-direct anterior 04/26/17    PT Comments    Progressing well with mobility. Practiced/reviewed exercises, gait training, and stair training. All educationo completed. Ready to d/c from PT standpoint.    Follow Up Recommendations  Outpatient PT     Equipment Recommendations  None recommended by PT    Recommendations for Other Services       Precautions / Restrictions Precautions Precautions: Fall Restrictions Weight Bearing Restrictions: No RLE Weight Bearing: Weight bearing as tolerated    Mobility  Bed Mobility               General bed mobility comments: oob in recliner  Transfers Overall transfer level: Needs assistance Equipment used: Rolling walker (2 wheeled) Transfers: Sit to/from Stand Sit to Stand: Supervision         General transfer comment: for safety.  Ambulation/Gait Ambulation/Gait assistance: Min guard Ambulation Distance (Feet): 100 Feet Assistive device: Rolling walker (2 wheeled) Gait Pattern/deviations: Step-through pattern;Decreased stride length     General Gait Details: Close guard for safety.    Stairs Stairs: Yes   Stair Management: Forwards;With walker Number of Stairs: 1 General stair comments: close guard for safety. VCs safety, sequence, technique.   Wheelchair Mobility    Modified Rankin (Stroke Patients Only)       Balance                                            Cognition Arousal/Alertness: Awake/alert Behavior During Therapy: WFL for tasks assessed/performed Overall Cognitive Status: Within Functional Limits for tasks assessed                                        Exercises Total Joint Exercises Hip ABduction/ADduction: AROM;Right;10 reps;Standing Knee Flexion: AROM;Right;10  reps;Standing Marching in Standing: AROM;Both;10 reps;Standing General Exercises - Lower Extremity Heel Raises: Both;10 reps;Standing    General Comments        Pertinent Vitals/Pain Pain Assessment: 0-10 Pain Score: 4  Pain Location: R hip Pain Descriptors / Indicators: Sore Pain Intervention(s): Monitored during session;Repositioned    Home Living                      Prior Function            PT Goals (current goals can now be found in the care plan section) Progress towards PT goals: Progressing toward goals    Frequency    7X/week      PT Plan Current plan remains appropriate    Co-evaluation              AM-PAC PT "6 Clicks" Daily Activity  Outcome Measure  Difficulty turning over in bed (including adjusting bedclothes, sheets and blankets)?: A Little Difficulty moving from lying on back to sitting on the side of the bed? : A Little Difficulty sitting down on and standing up from a chair with arms (e.g., wheelchair, bedside commode, etc,.)?: A Little Help needed moving to and from a bed to chair (including a wheelchair)?: A Little Help needed walking in hospital room?: A Little Help needed climbing 3-5 steps with a railing? :  A Little 6 Click Score: 18    End of Session Equipment Utilized During Treatment: Gait belt Activity Tolerance: Patient tolerated treatment well Patient left: in chair;with call bell/phone within reach;with family/visitor present   PT Visit Diagnosis: Muscle weakness (generalized) (M62.81);Difficulty in walking, not elsewhere classified (R26.2)     Time: 7673-4193 PT Time Calculation (min) (ACUTE ONLY): 11 min  Charges:  $Gait Training: 8-22 mins                    G Codes:          [name], MPT Pager: [phone]

## 2017-04-27 NOTE — Discharge Instructions (Addendum)
° °Dr. Frank Aluisio °Total Joint Specialist °[facility] °[address] °[place], [place] [address] °[phone] ° °ANTERIOR APPROACH TOTAL HIP REPLACEMENT POSTOPERATIVE DIRECTIONS ° ° °Hip Rehabilitation, Guidelines Following Surgery  °The results of a hip operation are greatly improved after range of motion and muscle strengthening exercises. Follow all safety measures which are given to protect your hip. If any of these exercises cause increased pain or swelling in your joint, decrease the amount until you are comfortable again. Then slowly increase the exercises. Call your caregiver if you have problems or questions.  ° °HOME CARE INSTRUCTIONS  °Remove items at home which could result in a fall. This includes throw rugs or furniture in walking pathways.  °· ICE to the affected hip every three hours for 30 minutes at a time and then as needed for pain and swelling.  Continue to use ice on the hip for pain and swelling from surgery. You may notice swelling that will progress down to the foot and ankle.  This is normal after surgery.  Elevate the leg when you are not up walking on it.   °· Continue to use the breathing machine which will help keep your temperature down.  It is common for your temperature to cycle up and down following surgery, especially at night when you are not up moving around and exerting yourself.  The breathing machine keeps your lungs expanded and your temperature down. ° ° °DIET °You may resume your previous home diet once your are discharged from the hospital. ° °DRESSING / WOUND CARE / SHOWERING °You may shower 3 days after surgery, but keep the wounds dry during showering.  You may use an occlusive plastic wrap (Press'n Seal for example), NO SOAKING/SUBMERGING IN THE BATHTUB.  If the bandage gets wet, change with a clean dry gauze.  If the incision gets wet, pat the wound dry with a clean towel. °You may start showering once you are discharged home but do not  submerge the incision under water. Just pat the incision dry and apply a dry gauze dressing on daily. °Change the surgical dressing daily and reapply a dry dressing each time. ° °ACTIVITY °Walk with your walker as instructed. °Use walker as long as suggested by your caregivers. °Avoid periods of inactivity such as sitting longer than an hour when not asleep. This helps prevent blood clots.  °You may resume a sexual relationship in one month or when given the OK by your doctor.  °You may return to work once you are cleared by your doctor.  °Do not drive a car for 6 weeks or until released by you surgeon.  °Do not drive while taking narcotics. ° °WEIGHT BEARING °Weight bearing as tolerated with assist device (walker, cane, etc) as directed, use it as long as suggested by your surgeon or therapist, typically at least 4-6 weeks. ° °POSTOPERATIVE CONSTIPATION PROTOCOL °Constipation - defined medically as fewer than three stools per week and severe constipation as less than one stool per week. ° °One of the most common issues patients have following surgery is constipation.  Even if you have a regular bowel pattern at home, your normal regimen is likely to be disrupted due to multiple reasons following surgery.  Combination of anesthesia, postoperative narcotics, change in appetite and fluid intake all can affect your bowels.  In order to avoid complications following surgery, here are some recommendations in order to help you during your recovery period. ° °Colace (docusate) - Pick up an over-the-counter   form of Colace or another stool softener and take twice a day as long as you are requiring postoperative pain medications.  Take with a full glass of water daily.  If you experience loose stools or diarrhea, hold the colace until you stool forms back up.  If your symptoms do not get better within 1 week or if they get worse, check with your doctor. ° °Dulcolax (bisacodyl) - Pick up over-the-counter and take as directed  by the product packaging as needed to assist with the movement of your bowels.  Take with a full glass of water.  Use this product as needed if not relieved by Colace only.  ° °MiraLax (polyethylene glycol) - Pick up over-the-counter to have on hand.  MiraLax is a solution that will increase the amount of water in your bowels to assist with bowel movements.  Take as directed and can mix with a glass of water, juice, soda, coffee, or tea.  Take if you go more than two days without a movement. °Do not use MiraLax more than once per day. Call your doctor if you are still constipated or irregular after using this medication for 7 days in a row. ° °If you continue to have problems with postoperative constipation, please contact the office for further assistance and recommendations.  If you experience "the worst abdominal pain ever" or develop nausea or vomiting, please contact the office immediatly for further recommendations for treatment. ° °ITCHING ° If you experience itching with your medications, try taking only a single pain pill, or even half a pain pill at a time.  You can also use Benadryl over the counter for itching or also to help with sleep.  ° °TED HOSE STOCKINGS °Wear the elastic stockings on both legs for three weeks following surgery during the day but you may remove then at night for sleeping. ° °MEDICATIONS °See your medication summary on the “After Visit Summary” that the nursing staff will review with you prior to discharge.  You may have some home medications which will be placed on hold until you complete the course of blood thinner medication.  It is important for you to complete the blood thinner medication as prescribed by your surgeon.  Continue your approved medications as instructed at time of discharge. ° °PRECAUTIONS °If you experience chest pain or shortness of breath - call 911 immediately for transfer to the hospital emergency department.  °If you develop a fever greater that 101 F,  purulent drainage from wound, increased redness or drainage from wound, foul odor from the wound/dressing, or calf pain - CONTACT YOUR SURGEON.   °                                                °FOLLOW-UP APPOINTMENTS °Make sure you keep all of your appointments after your operation with your surgeon and caregivers. You should call the office at the above phone number and make an appointment for approximately two weeks after the date of your surgery or on the date instructed by your surgeon outlined in the "After Visit Summary". ° °RANGE OF MOTION AND STRENGTHENING EXERCISES  °These exercises are designed to help you keep full movement of your hip joint. Follow your caregiver's or physical therapist's instructions. Perform all exercises about fifteen times, three times per day or as directed. Exercise both hips, even if you   have had only one joint replacement. These exercises can be done on a training (exercise) mat, on the floor, on a table or on a bed. Use whatever works the best and is most comfortable for you. Use music or television while you are exercising so that the exercises are a pleasant break in your day. This will make your life better with the exercises acting as a break in routine you can look forward to.  °Lying on your back, slowly slide your foot toward your buttocks, raising your knee up off the floor. Then slowly slide your foot back down until your leg is straight again.  °Lying on your back spread your legs as far apart as you can without causing discomfort.  °Lying on your side, raise your upper leg and foot straight up from the floor as far as is comfortable. Slowly lower the leg and repeat.  °Lying on your back, tighten up the muscle in the front of your thigh (quadriceps muscles). You can do this by keeping your leg straight and trying to raise your heel off the floor. This helps strengthen the largest muscle supporting your knee.  °Lying on your back, tighten up the muscles of your  buttocks both with the legs straight and with the knee bent at a comfortable angle while keeping your heel on the floor.  ° °IF YOU ARE TRANSFERRED TO A SKILLED REHAB FACILITY °If the patient is transferred to a skilled rehab facility following release from the hospital, a list of the current medications will be sent to the facility for the patient to continue.  When discharged from the skilled rehab facility, please have the facility set up the patient's Home Health Physical Therapy prior to being released. Also, the skilled facility will be responsible for providing the patient with their medications at time of release from the facility to include their pain medication, the muscle relaxants, and their blood thinner medication. If the patient is still at the rehab facility at time of the two week follow up appointment, the skilled rehab facility will also need to assist the patient in arranging follow up appointment in our office and any transportation needs. ° °MAKE SURE YOU:  °Understand these instructions.  °Get help right away if you are not doing well or get worse.  ° ° °Pick up stool softner and laxative for home use following surgery while on pain medications. °Do not submerge incision under water. °Please use good hand washing techniques while changing dressing each day. °May shower starting three days after surgery. °Please use a clean towel to pat the incision dry following showers. °Continue to use ice for pain and swelling after surgery. °Do not use any lotions or creams on the incision until instructed by your surgeon. ° °Take Xarelto for two and a half more weeks following discharge from the hospital, then discontinue Xarelto. °Once the patient has completed the Xarelto, they may resume the 81 mg Aspirin. ° ° ° °Information on my medicine - XARELTO® (Rivaroxaban) ° °This medication education was reviewed with me or my healthcare representative as part of my discharge preparation.  ° °Why was Xarelto®  prescribed for you? °Xarelto® was prescribed for you to reduce the risk of blood clots forming after orthopedic surgery. The medical term for these abnormal blood clots is venous thromboembolism (VTE). ° °What do you need to know about xarelto® ? °Take your Xarelto® ONCE DAILY at the same time every day. °You may take it either with or without food. ° °  If you have difficulty swallowing the tablet whole, you may crush it and mix in applesauce just prior to taking your dose. ° °Take Xarelto® exactly as prescribed by your doctor and DO NOT stop taking Xarelto® without talking to the doctor who prescribed the medication.  Stopping without other VTE prevention medication to take the place of Xarelto® may increase your risk of developing a clot. ° °After discharge, you should have regular check-up appointments with your healthcare provider that is prescribing your Xarelto®.   ° °What do you do if you miss a dose? °If you miss a dose, take it as soon as you remember on the same day then continue your regularly scheduled once daily regimen the next day. Do not take two doses of Xarelto® on the same day.  ° °Important Safety Information °A possible side effect of Xarelto® is bleeding. You should call your healthcare provider right away if you experience any of the following: °? Bleeding from an injury or your nose that does not stop. °? Unusual colored urine (red or dark brown) or unusual colored stools (red or black). °? Unusual bruising for unknown reasons. °? A serious fall or if you hit your head (even if there is no bleeding). ° °Some medicines may interact with Xarelto® and might increase your risk of bleeding while on Xarelto®. To help avoid this, consult your healthcare provider or pharmacist prior to using any new prescription or non-prescription medications, including herbals, vitamins, non-steroidal anti-inflammatory drugs (NSAIDs) and supplements. ° °This website has more information on Xarelto®:  www.xarelto.com. ° ° °

## 2017-04-27 NOTE — Progress Notes (Signed)
   Subjective: 1 Day Post-Op Procedure(s) (LRB): RIGHT TOTAL HIP ARTHROPLASTY ANTERIOR APPROACH (Right) Patient reports pain as mild.   Patient seen in rounds by Dr. Wynelle Link. Patient is well, and has had no acute complaints or problems We will resume therapy today. She walked 115 feet the day of surgery.  She had a good night. Plan is to go Home after hospital stay.  She did well on the day of surgery and was setup for discharge on the afternoon of POD 1.  Objective: Vital signs in last 24 hours: Temp:  [97.5 F (36.4 C)-98.8 F (37.1 C)] 98.4 F (36.9 C) (05/31 0534) Pulse Rate:  [53-65] 54 (05/31 0534) Resp:  [14-18] 16 (05/31 0534) BP: (94-129)/(41-80) 99/42 (05/31 0534) SpO2:  [94 %-100 %] 96 % (05/31 0534)  Intake/Output from previous day:  Intake/Output Summary (Last 24 hours) at 04/27/17 0716 Last data filed at 04/27/17 0240  Gross per 24 hour  Intake             3655 ml  Output             3100 ml  Net              555 ml    Intake/Output this shift: UOP 750 since around [place]  Labs:  Recent Labs  04/27/17 0421  HGB 8.7*    Recent Labs  04/27/17 0421  WBC 10.1  RBC 2.88*  HCT 26.1*  PLT 132*    Recent Labs  04/27/17 0421  NA 141  K 3.8  CL 110  CO2 27  BUN 10  CREATININE 0.66  GLUCOSE 129*  CALCIUM 8.4*   No results for input(s): LABPT, INR in the last 72 hours.  EXAM General - Patient is Alert, Appropriate and Oriented Extremity - Neurovascular intact Sensation intact distally Intact pulses distally Dorsiflexion/Plantar flexion intact Dressing - dressing C/D/I Motor Function - intact, moving foot and toes well on exam.  Hemovac came out with transferring.  Past Medical History:  Diagnosis Date  . Arthritis   . Dizziness   . GERD (gastroesophageal reflux disease)   . Headache   . Pancytopenia ([place]) 2016  . PONV (postoperative nausea and vomiting)   . Stress incontinence     Assessment/Plan: 1 Day Post-Op Procedure(s)  (LRB): RIGHT TOTAL HIP ARTHROPLASTY ANTERIOR APPROACH (Right) Principal Problem:   OA (osteoarthritis) of hip  Estimated body mass index is 31.93 kg/m as calculated from the following:   Height as of this encounter: 5\' 1"  (1.549 m).   Weight as of this encounter: 76.7 kg (169 lb). Advance diet Up with therapy Discharge home - straight to outpatient therapy at [facility] on June 4th.  DVT Prophylaxis - Xarelto Weight Bearing As Tolerated right Leg Hemovac out Begin Therapy  DC Meds - Tramadol and Xarelto Diet As Tolerated F/U in two weeks Disposition - Home COD - pending therapy.  Melissa Muslim, PA-C Orthopaedic Surgery 04/27/2017, 7:16 AM

## 2017-04-27 NOTE — Progress Notes (Signed)
RN reviewed discharge instructions with patient and family. All questions answered.   Paperwork and prescriptions given.   NT rolled patient down with all belongings to family car. 

## 2017-05-01 ENCOUNTER — Ambulatory Visit (HOSPITAL_COMMUNITY): Payer: Medicare Other | Attending: Orthopedic Surgery | Admitting: Physical Therapy

## 2017-05-01 DIAGNOSIS — M25651 Stiffness of right hip, not elsewhere classified: Secondary | ICD-10-CM

## 2017-05-01 DIAGNOSIS — R262 Difficulty in walking, not elsewhere classified: Secondary | ICD-10-CM | POA: Diagnosis not present

## 2017-05-01 DIAGNOSIS — M6281 Muscle weakness (generalized): Secondary | ICD-10-CM

## 2017-05-01 NOTE — Patient Instructions (Addendum)
Heel Raise: Bilateral (Standing)    Rise on balls of feet. Repeat __10__ times per set. Do __1__ sets per session. Do ___2_ sessions per day.  http://orth.exer.us/38   Copyright  VHI. All rights reserved.  Functional Quadriceps: Chair Squat    Keeping feet flat on floor, shoulder width apart, squat as low as is comfortable. Use support as necessary. Repeat __10__ times per set. Do _1___ sets per session. Do __2__ sessions per day.  http://orth.exer.us/736   Copyright  VHI. All rights reserved.  Bridging    Slowly raise buttocks from floor, keeping stomach tight. Repeat _10___ times per set. Do ___1_ sets per session. Do __2__ sessions per day.  http://orth.exer.us/1096   Copyright  VHI. All rights reserved.  Strengthening: Straight Leg Raise (Phase 1)   Have right knee bent then lift your foot off the ground advance to the point where your knee is straight.  Tighten muscles on front of right thigh, then lift leg _12___ inches from surface, keeping knee locked.  Repeat ___10_ times per set. Do __1__ sets per session. Do __2__ sessions per day.  http://orth.exer.us/614   Copyright  VHI. All rights reserved.  Strengthening: Hip Abduction (Side-Lying)    Tighten muscles on front of right  thigh, then lift leg __10__ inches from surface, keeping knee locked.  Repeat _10___ times per set. Do _1___ sets per session. Do ____2 sessions per day.  http://orth.exer.us/622   Copyright  VHI. All rights reserved.

## 2017-05-01 NOTE — Therapy (Signed)
[address] [place], [place], [address] Phone: [phone]   Fax:  [phone]  Physical Therapy Evaluation  Patient Details  Name: Melissa Villanueva MRN: [id number] Date of Birth: May 28, 1946 Referring Provider: Hector Shade  Encounter Date: 05/01/2017      PT End of Session - 05/01/17 1612    Visit Number 1  per md 2x a week x 2 week    Number of Visits 4   Date for PT Re-Evaluation 05/31/17   Authorization Type medicare   Authorization - Visit Number 1   Authorization - Number of Visits 4   PT Start Time 1440   PT Stop Time 1520   PT Time Calculation (min) 40 min   Activity Tolerance Patient tolerated treatment well   Behavior During Therapy [facility] for tasks assessed/performed      Past Medical History:  Diagnosis Date  . Arthritis   . Dizziness   . GERD (gastroesophageal reflux disease)   . Headache   . Pancytopenia ([place]) 2016  . PONV (postoperative nausea and vomiting)   . Stress incontinence     Past Surgical History:  Procedure Laterality Date  . CARPAL TUNNEL RELEASE     left   . CESAREAN SECTION    . CHOLECYSTECTOMY    . EYE SURGERY  Sept 2015   Bilateral Glaucoma with surgery  . TOTAL HIP ARTHROPLASTY Left   . TOTAL HIP ARTHROPLASTY Right 04/26/2017   Procedure: RIGHT TOTAL HIP ARTHROPLASTY ANTERIOR APPROACH;  Surgeon: Gaynelle Arabian, MD;  Location: [facility];  Service: Orthopedics;  Laterality: Right;  . TOTAL KNEE ARTHROPLASTY Right 10/12/2015   Procedure: RIGHT TOTAL KNEE ARTHROPLASTY;  Surgeon: Gaynelle Arabian, MD;  Location: [facility];  Service: Orthopedics;  Laterality: Right;    There were no vitals filed for this visit.       Subjective Assessment - 05/01/17 1439    Subjective Melissa Villanueva had a right THR on 04/26/2017.  She is at home with her son who is leaving to go home on Sunday.She  feels that she is doing well.  She is still currently using a walker and is very sore.  She is icing on a  constant basis.  She would like to be able to get into her bed on her own and be walking with a cane.      Pertinent History OA; Lt THR; Rt TKR    How long can you sit comfortably? able to sit for an hour    How long can you stand comfortably? Pt is able to stand for 30 minutes.    How long can you walk comfortably? Walking with her walker for about 15-20 minutes    Patient Stated Goals To walk with a cane, nothing if able    Currently in Pain? No/denies  Pain in the hip the greatest is 1/10; Rt knee =4/10   Pain Score 1    Pain Location Hip   Pain Orientation Right   Pain Descriptors / Indicators Aching   Pain Type Acute pain   Pain Onset 1 to 4 weeks ago   Pain Frequency Constant   Aggravating Factors  exercising    Pain Relieving Factors ice    Effect of Pain on Daily Activities increases             [facility] PT Assessment - 05/01/17 0001      Assessment   Medical Diagnosis Rt THR anterior approach   Referring Provider  Hector Shade   Onset Date/Surgical Date 04/26/17   Next MD Visit 04/26/2017   Prior Therapy acute     Precautions   Precautions Anterior Hip     Restrictions   Weight Bearing Restrictions No     Balance Screen   Has the patient fallen in the past 6 months No   Has the patient had a decrease in activity level because of a fear of falling?  No   Is the patient reluctant to leave their home because of a fear of falling?  No     Home Ecologist residence     Prior Function   Level of Independence Independent with community mobility with device     Cognition   Overall Cognitive Status Within Functional Limits for tasks assessed     Observation/Other Assessments   Focus on Therapeutic Outcomes (FOTO)  38     Functional Tests   Functional tests Single leg stance;Sit to Stand     Single Leg Stance   Comments Rt;  0 seconds        LT :10 seconds     Sit to Stand   Comments unable without using her hands      ROM /  Strength   AROM / PROM / Strength Strength     Strength   Strength Assessment Site Hip;Knee;Ankle   Right/Left Hip Right   Right Hip Flexion 2+/5   Right Hip Extension 2/5   Right Hip ABduction 3-/5   Right/Left Knee Right   Right Knee Flexion 4/5   Right Knee Extension 4/5   Right/Left Ankle Right     Bed Mobility   Bed Mobility Sit to Supine   Sit to Supine 4: Min assist            Objective measurements completed on examination: See above findings.          [facility] Adult PT Treatment/Exercise - 05/01/17 0001      Exercises   Exercises Knee/Hip     Knee/Hip Exercises: Standing   Heel Raises Both;5 reps   Functional Squat 5 reps     Knee/Hip Exercises: Supine   Bridges 5 reps   Straight Leg Raises Right;5 reps   Straight Leg Raises Limitations knee bent      Knee/Hip Exercises: Sidelying   Hip ABduction 5 reps                PT Education - 05/01/17 1612    Education provided Yes   Education Details HEP   Person(s) Educated Patient   Methods Explanation;Handout   Comprehension Verbalized understanding;Returned demonstration          PT Short Term Goals - 05/01/17 1618      PT SHORT TERM GOAL #1   Title Pt to be walking in her home with a cane    Time 2   Period Weeks   Status New     PT SHORT TERM GOAL #2   Title Pt to be able to single leg stance on her right leg for five seconds to decrease risk of falling,   Time 2   Period Weeks   Status New     PT SHORT TERM GOAL #3   Title Pt to be I in bed mobility (pt needs assist getting her right leg onto the bed )   Time 2   Period Weeks   Status New     PT SHORT TERM GOAL #  4   Title Pt to be able to don and doff her shoes and socks    Time 2   Period Weeks   Status New                   Plan - 05-14-17 1613    Clinical Impression Statement Melissa Villanueva is a 71 yo female who had an anterior Rt THR on 04/26/2017 and is now being referred to skilled physical thearapy.   Examination demonstrated decreased mobility, decreased strength, decreased balance, decrased ROM, decreased activity tolerance and an abnormal gait.  Melissa Villanueva will benefit from skilled physical therapy to address these issues and maximize her functional ability.  Her MD has ordered 2x week x 2 weeks only therefore a strong HEP will be needed.    History and Personal Factors relevant to plan of care: Rt TKR; Lt THP    Clinical Presentation Stable   Clinical Decision Making Low   Rehab Potential Good   PT Frequency 2x / week   PT Duration 2 weeks   PT Treatment/Interventions ADLs/Self Care Home Management;Functional mobility training;Therapeutic activities;Gait training;Stair training;Therapeutic exercise;Balance training;Neuromuscular re-education;Patient/family education   PT Next Visit Plan begin rockerboard, standing knee flexion, SLS, lunges and step ups give as HEP    PT Home Exercise Plan heel raises, minisquats, bridging, bent knee raise, hip abduction. Work on bed mobility and semi tandem stance for balance progress to ambulation with a cane.    Consulted and Agree with Plan of Care Patient      Patient will benefit from skilled therapeutic intervention in order to improve the following deficits and impairments:  Abnormal gait, Decreased activity tolerance, Decreased balance, Decreased range of motion, Decreased strength, Difficulty walking, Pain  Visit Diagnosis: Muscle weakness (generalized)  Stiffness of right hip, not elsewhere classified  Difficulty in walking, not elsewhere classified      G-Codes - 05-14-2017 1621    Functional Assessment Tool Used (Outpatient Only) foto   Functional Limitation Mobility: Walking and moving around   Mobility: Walking and Moving Around Current Status [id number]) At least 60 percent but less than 80 percent impaired, limited or restricted   Mobility: Walking and Moving Around Goal Status [phone]) At least 40 percent but less than 60 percent  impaired, limited or restricted       Problem List Patient Active Problem List   Diagnosis Date Noted  . OA (osteoarthritis) of hip 04/26/2017  . Acute onset of severe vertigo 09/20/2016  . Abnormality of gait 09/20/2016  . OA (osteoarthritis) of knee 10/12/2015  . Pancytopenia ([place]) 10/17/2014  . Rheumatoid arthritis [facility]) 10/17/2014    Rayetta Humphrey, PT CLT [phone] May 14, 2017, 4:25 PM  [facility] [address] [place], [place], [address] Phone: [phone]   Fax:  [phone]  Name: Melissa Villanueva MRN: [id number] Date of Birth: Apr 22, 1946

## 2017-05-04 ENCOUNTER — Ambulatory Visit (HOSPITAL_COMMUNITY): Payer: Medicare Other | Admitting: Physical Therapy

## 2017-05-04 DIAGNOSIS — M6281 Muscle weakness (generalized): Secondary | ICD-10-CM | POA: Diagnosis not present

## 2017-05-04 DIAGNOSIS — R262 Difficulty in walking, not elsewhere classified: Secondary | ICD-10-CM | POA: Diagnosis not present

## 2017-05-04 DIAGNOSIS — M25651 Stiffness of right hip, not elsewhere classified: Secondary | ICD-10-CM | POA: Diagnosis not present

## 2017-05-04 NOTE — Therapy (Signed)
[place], [place], [address] Phone: [phone]   Fax:  [phone]  Physical Therapy Treatment  Patient Details  Name: Melissa Villanueva MRN: [id number] Date of Birth: 1946/03/16 Referring Provider: Hector Shade  Encounter Date: 05/04/2017      PT End of Session - 05/04/17 1317    Visit Number 2  per md 2x a week x 2 week    Number of Visits 4   Date for PT Re-Evaluation 05/31/17   Authorization Type medicare   Authorization - Visit Number 2   Authorization - Number of Visits 4   PT Start Time 1779   PT Stop Time 1204   PT Time Calculation (min) 41 min   Activity Tolerance Patient tolerated treatment well   Behavior During Therapy [facility] for tasks assessed/performed      Past Medical History:  Diagnosis Date  . Arthritis   . Dizziness   . GERD (gastroesophageal reflux disease)   . Headache   . Pancytopenia ([place]) 2016  . PONV (postoperative nausea and vomiting)   . Stress incontinence     Past Surgical History:  Procedure Laterality Date  . CARPAL TUNNEL RELEASE     left   . CESAREAN SECTION    . CHOLECYSTECTOMY    . EYE SURGERY  Sept 2015   Bilateral Glaucoma with surgery  . TOTAL HIP ARTHROPLASTY Left   . TOTAL HIP ARTHROPLASTY Right 04/26/2017   Procedure: RIGHT TOTAL HIP ARTHROPLASTY ANTERIOR APPROACH;  Surgeon: Gaynelle Arabian, MD;  [facility];  Service: Orthopedics;  Laterality: Right;  . TOTAL KNEE ARTHROPLASTY Right 10/12/2015   Procedure: RIGHT TOTAL KNEE ARTHROPLASTY;  Surgeon: Gaynelle Arabian, MD;  [facility];  Service: Orthopedics;  Laterality: Right;    There were no vitals filed for this visit.                       [facility] Adult PT Treatment/Exercise - 05/04/17 0001      Knee/Hip Exercises: Standing   Heel Raises Both;10 reps   Knee Flexion Right;10 reps   Hip Flexion Both;10 reps   Hip Flexion Limitations marching   Forward Lunges Right;10 reps    Forward Lunges Limitations onto 4" box   Lateral Step Up Right;10 reps;Step Height: 4"   Lateral Step Up Limitations 1 HHA   Functional Squat 10 reps   Gait Training with SPC 100 feet X 2     Knee/Hip Exercises: Supine   Heel Slides Right;10 reps   Bridges 10 reps   Straight Leg Raises Limitations unable     Knee/Hip Exercises: Sidelying   Hip ABduction Limitations held at this point   Clams 10 reps Rt only                PT Education - 05/04/17 1327    Education provided Yes   Education Details reviewed HEP and goals per intial evaluation   Person(s) Educated Patient   Methods Explanation;Demonstration;Tactile cues;Verbal cues;Handout   Comprehension Verbalized understanding;Returned demonstration;Verbal cues required;Tactile cues required          PT Short Term Goals - 05/01/17 1618      PT SHORT TERM GOAL #1   Title Pt to be walking in her home with a cane    Time 2   Period Weeks   Status New     PT SHORT TERM GOAL #2   Title Pt to be able to  single leg stance on her right leg for five seconds to decrease risk of falling,   Time 2   Period Weeks   Status New     PT SHORT TERM GOAL #3   Title Pt to be I in bed mobility (pt needs assist getting her right leg onto the bed )   Time 2   Period Weeks   Status New     PT SHORT TERM GOAL #4   Title Pt to be able to don and doff her shoes and socks    Time 2   Period Weeks   Status New                  Plan - 05/04/17 1319    Clinical Impression Statement Reivewed HEP and goals per evaluation.  Pt with increased soreness following evaluation and is still with increased pain/discomfort this session.  Reports compliance with HEP and states she really wants to get rid of her walker.  Began gait with SPC this session with overall good stabiliy.  pt with exaggerated advancement of Rt LE due to "that leg is now longer than my left".  Worked on normalizing this.  Advanced exericses and HEP as instructed  in PT plan.  Pt with minimal cues needed with therex.  No increase in pain at end of session.    Rehab Potential Good   PT Frequency 2x / week   PT Duration 2 weeks   PT Treatment/Interventions ADLs/Self Care Home Management;Functional mobility training;Therapeutic activities;Gait training;Stair training;Therapeutic exercise;Balance training;Neuromuscular re-education;Patient/family education   PT Next Visit Plan begin rockerboard, forward step ups and dynamic balance exercises (side stepping, tandem, etc).  Continue to work on gait with SPC.  Update HEP as only has 2 visits remaining (4 total).   PT Home Exercise Plan heel raises, minisquats, bridging, bent knee raise, hip abduction.    Consulted and Agree with Plan of Care Patient      Patient will benefit from skilled therapeutic intervention in order to improve the following deficits and impairments:  Abnormal gait, Decreased activity tolerance, Decreased balance, Decreased range of motion, Decreased strength, Difficulty walking, Pain  Visit Diagnosis: Muscle weakness (generalized)  Stiffness of right hip, not elsewhere classified  Difficulty in walking, not elsewhere classified     Problem List Patient Active Problem List   Diagnosis Date Noted  . OA (osteoarthritis) of hip 04/26/2017  . Acute onset of severe vertigo 09/20/2016  . Abnormality of gait 09/20/2016  . OA (osteoarthritis) of knee 10/12/2015  . Pancytopenia ([place]) 10/17/2014  . Rheumatoid arthritis ([place]) 10/17/2014    Teena Irani, PTA/CLT [phone]  05/04/2017, 1:32 PM  [facility] [address] [place], [place], [address] Phone: [phone]   Fax:  [phone]  Name: Melissa Villanueva MRN: [id number] Date of Birth: [birth year]

## 2017-05-04 NOTE — Patient Instructions (Signed)
Single Leg - Eyes Open    Holding support, lift right leg while maintaining balance over other leg. Progress to removing hands from support surface for longer periods of time. Hold__10__ seconds. Repeat __5__ times per session. Do __2__ sessions per day.  Anterior Lateral Step-Up    Stand with _4__ inch step placed in AL direction. Step onto step with right foot, facing forward, and without pushing off with the ground foot. Finish with ground foot in touch balance on step and return _10__ times _2__ times per day.  Bracing With Forward Lunge (Standing)    Stand with hands on hips. Find neutral spine. Tighten pelvic floor and abdominals and hold. Alternating legs, step forward and bend knee to lower trunk.Repeat 10 times. Do 2 times a day. May use a 4" step/riser to decrease weight/load on lead legAbduction: Clam (Eccentric) - Side-Lying    Lie on side with knees bent. Lift top knee, keeping feet together. Keep trunk steady. Slowly lower for 3-5 seconds. _10_ reps per set, _2__ sets per day

## 2017-05-08 ENCOUNTER — Ambulatory Visit (HOSPITAL_COMMUNITY): Payer: Medicare Other | Admitting: Physical Therapy

## 2017-05-08 DIAGNOSIS — M25651 Stiffness of right hip, not elsewhere classified: Secondary | ICD-10-CM

## 2017-05-08 DIAGNOSIS — R262 Difficulty in walking, not elsewhere classified: Secondary | ICD-10-CM

## 2017-05-08 DIAGNOSIS — M6281 Muscle weakness (generalized): Secondary | ICD-10-CM | POA: Diagnosis not present

## 2017-05-09 DIAGNOSIS — Z471 Aftercare following joint replacement surgery: Secondary | ICD-10-CM | POA: Diagnosis not present

## 2017-05-09 DIAGNOSIS — Z96641 Presence of right artificial hip joint: Secondary | ICD-10-CM | POA: Diagnosis not present

## 2017-05-09 NOTE — Therapy (Signed)
[place], [place], [address] Phone: [phone]   Fax:  [phone]  Physical Therapy Treatment  Patient Details  Name: Melissa Villanueva MRN: [id number] Date of Birth: 19-Apr-1946 Referring Provider: Hector Shade  Encounter Date: 05/08/2017      PT End of Session - 05/09/17 1220    Visit Number 3  per md 2x a week x 2 week    Number of Visits 4   Date for PT Re-Evaluation 05/31/17   Authorization Type medicare   Authorization - Visit Number 3   Authorization - Number of Visits 4   PT Start Time 0370   PT Stop Time 4888   PT Time Calculation (min) 44 min   Activity Tolerance Patient tolerated treatment well;No increased pain   Behavior During Therapy [facility] for tasks assessed/performed      Past Medical History:  Diagnosis Date  . Arthritis   . Dizziness   . GERD (gastroesophageal reflux disease)   . Headache   . Pancytopenia ([place]) 2016  . PONV (postoperative nausea and vomiting)   . Stress incontinence     Past Surgical History:  Procedure Laterality Date  . CARPAL TUNNEL RELEASE     left   . CESAREAN SECTION    . CHOLECYSTECTOMY    . EYE SURGERY  Sept 2015   Bilateral Glaucoma with surgery  . TOTAL HIP ARTHROPLASTY Left   . TOTAL HIP ARTHROPLASTY Right 04/26/2017   Procedure: RIGHT TOTAL HIP ARTHROPLASTY ANTERIOR APPROACH;  Surgeon: Gaynelle Arabian, MD;  [facility];  Service: Orthopedics;  Laterality: Right;  . TOTAL KNEE ARTHROPLASTY Right 10/12/2015   Procedure: RIGHT TOTAL KNEE ARTHROPLASTY;  Surgeon: Gaynelle Arabian, MD;  [facility];  Service: Orthopedics;  Laterality: Right;    There were no vitals filed for this visit.      Subjective Assessment - 05/08/17 1731    Subjective Pt reports that she continues to have pain with her Rt hip during certain movements. She wishes she had a different approach because her other one didn't bother her.    Pertinent History OA; Lt THR; Rt  TKR    How long can you sit comfortably? able to sit for an hour    How long can you stand comfortably? Pt is able to stand for 30 minutes.    How long can you walk comfortably? Walking with her walker for about 15-20 minutes    Patient Stated Goals To walk with a cane, nothing if able    Currently in Pain? Yes   Pain Score 1    Pain Location Groin   Pain Orientation Right   Pain Descriptors / Indicators Aching   Pain Type Acute pain   Pain Onset 1 to 4 weeks ago   Pain Frequency Constant   Aggravating Factors  certain movements (not listed)   Pain Relieving Factors ice, resting                          [facility] Adult PT Treatment/Exercise - 05/09/17 0001      Knee/Hip Exercises: Standing   Rocker Board 2 minutes   Rocker Board Limitations Lt/Rt   Other Standing Knee Exercises wall marching with ipsilateral UE/LE flexion x10 reps each. Forward step ups onto 4 steps with contralateral knee drive 9V69 reps.   Other Standing Knee Exercises side stepping in // bars x3 RT     Manual Therapy  Manual Therapy Myofascial release   Manual therapy comments separate rest of session   Myofascial Release Trigger point release Rt iliacus, glute med/proximal glute max      supine hip abduction x5 reps pre-manual techniques. x15 reps post manual techniques.            PT Education - 05/09/17 1219    Education provided Yes   Education Details technique with therex; benefits of PT in addressing muscle imbalances and functional strength and mobility; implications of manual techniques with HEP additions    Person(s) Educated Patient   Methods Explanation;Demonstration;Verbal cues;Handout   Comprehension Returned demonstration;Verbalized understanding          PT Short Term Goals - 05/01/17 1618      PT SHORT TERM GOAL #1   Title Pt to be walking in her home with a cane    Time 2   Period Weeks   Status New     PT SHORT TERM GOAL #2   Title Pt to be able to single  leg stance on her right leg for five seconds to decrease risk of falling,   Time 2   Period Weeks   Status New     PT SHORT TERM GOAL #3   Title Pt to be I in bed mobility (pt needs assist getting her right leg onto the bed )   Time 2   Period Weeks   Status New     PT SHORT TERM GOAL #4   Title Pt to be able to don and doff her shoes and socks    Time 2   Period Weeks   Status New                  Plan - 05/09/17 1220    Clinical Impression Statement Pt continues to report pain with several exercises she is performing at home and with simple tasks such as getting in/out of her bed. Therapist noted palpable trigger points along pt's hip flexor and along the hip abductors which reproduced her familiar pain. Manual techniques were performed to address this, with pt reporting decrease in pain with return to hip abduction exercise during the session. She continues to demonstrate hip weakness and soft tissue limitations which are impacting her daily mobility. Will continue with current POC.   Rehab Potential Good   PT Frequency 2x / week   PT Duration 2 weeks   PT Treatment/Interventions ADLs/Self Care Home Management;Functional mobility training;Therapeutic activities;Gait training;Stair training;Therapeutic exercise;Balance training;Neuromuscular re-education;Patient/family education   PT Next Visit Plan Soft tissue techniques to address muscle spasm in hip abductors/hip flexors; hip abductor/extensor strength; balance activity   PT Home Exercise Plan heel raises, minisquats, bridging, bent knee raise, hip abduction, self trigger point massage to glute med   Consulted and Agree with Plan of Care Patient      Patient will benefit from skilled therapeutic intervention in order to improve the following deficits and impairments:  Abnormal gait, Decreased activity tolerance, Decreased balance, Decreased range of motion, Decreased strength, Difficulty walking, Pain  Visit  Diagnosis: Muscle weakness (generalized)  Stiffness of right hip, not elsewhere classified  Difficulty in walking, not elsewhere classified     Problem List Patient Active Problem List   Diagnosis Date Noted  . OA (osteoarthritis) of hip 04/26/2017  . Acute onset of severe vertigo 09/20/2016  . Abnormality of gait 09/20/2016  . OA (osteoarthritis) of knee 10/12/2015  . Pancytopenia ([place]) 10/17/2014  . Rheumatoid arthritis ([place]) 10/17/2014  12:30 PM,05/09/17 Elly Modena PT, DPT [facility] [address] [place], [place], [address] Phone: [phone]   Fax:  [phone]  Name: CHANEL MCADAMS MRN: [id number] Date of Birth: 1946-11-26

## 2017-05-12 ENCOUNTER — Ambulatory Visit (HOSPITAL_COMMUNITY): Payer: Medicare Other | Admitting: Physical Therapy

## 2017-05-12 DIAGNOSIS — M6281 Muscle weakness (generalized): Secondary | ICD-10-CM | POA: Diagnosis not present

## 2017-05-12 DIAGNOSIS — R262 Difficulty in walking, not elsewhere classified: Secondary | ICD-10-CM

## 2017-05-12 DIAGNOSIS — M25651 Stiffness of right hip, not elsewhere classified: Secondary | ICD-10-CM | POA: Diagnosis not present

## 2017-05-14 NOTE — Therapy (Signed)
[place], [place], [address] Phone: [phone]   Fax:  [phone]  Physical Therapy Treatment  Patient Details  Name: Melissa Villanueva MRN: [id number] Date of Birth: December 08, 1945 Referring Provider: Hector Shade  Encounter Date: 05/12/2017      PT End of Session - 05/14/17 1912    Visit Number 4  per md 2x a week x 2 week    Number of Visits 5   Date for PT Re-Evaluation 05/31/17   Authorization Type medicare   Authorization - Visit Number 4   Authorization - Number of Visits 5   PT Start Time 0254   PT Stop Time 2706   PT Time Calculation (min) 42 min   Activity Tolerance Patient tolerated treatment well;No increased pain   Behavior During Therapy WFL for tasks assessed/performed      Past Medical History:  Diagnosis Date  . Arthritis   . Dizziness   . GERD (gastroesophageal reflux disease)   . Headache   . Pancytopenia ([place]) 2016  . PONV (postoperative nausea and vomiting)   . Stress incontinence     Past Surgical History:  Procedure Laterality Date  . CARPAL TUNNEL RELEASE     left   . CESAREAN SECTION    . CHOLECYSTECTOMY    . EYE SURGERY  Sept 2015   Bilateral Glaucoma with surgery  . TOTAL HIP ARTHROPLASTY Left   . TOTAL HIP ARTHROPLASTY Right 04/26/2017   Procedure: RIGHT TOTAL HIP ARTHROPLASTY ANTERIOR APPROACH;  Surgeon: Gaynelle Arabian, MD;  Location: [facility];  Service: Orthopedics;  Laterality: Right;  . TOTAL KNEE ARTHROPLASTY Right 10/12/2015   Procedure: RIGHT TOTAL KNEE ARTHROPLASTY;  Surgeon: Gaynelle Arabian, MD;  Location: [facility];  Service: Orthopedics;  Laterality: Right;    There were no vitals filed for this visit.      Subjective Assessment - 05/14/17 1912    Subjective Pt reports that she is doing pretty good today. Her hip is not bothering her too much right now. She has been completing her HEP.    Pertinent History OA; Lt THR; Rt TKR    How long can you sit  comfortably? able to sit for an hour    How long can you stand comfortably? Pt is able to stand for 30 minutes.    How long can you walk comfortably? Walking with her walker for about 15-20 minutes    Patient Stated Goals To walk with a cane, nothing if able    Currently in Pain? No/denies                [facility] Adult PT Treatment/Exercise - 05/14/17 0001      Knee/Hip Exercises: Standing   Knee Flexion Right;20 reps;1 set;Left   Knee Flexion Limitations 2# weight    Forward Step Up 3 sets;Right;Step Height: 6";Hand Hold: 1   Forward Step Up Limitations contralateral knee drive    Other Standing Knee Exercises side stepping in // bars x4 RT    Other Standing Knee Exercises single leg hip flexion on foam pad, 1 finger support 2x10 reps each      Knee/Hip Exercises: Seated   Sit to Sand 10 reps;with UE support;Other (comment)  green TB around knees.     Tandem hold 2x30 sec each  NBOS on foam with trunk rotation x10 reps Lt/Rt           PT Education - 05/14/17 1912    Education provided Yes  Education Details updated HEP; upcoming re-evaluation to determine if more visits are needed   Person(s) Educated Patient   Methods Explanation;Verbal cues;Handout   Comprehension Verbalized understanding;Returned demonstration          PT Short Term Goals - 05/01/17 1618      PT SHORT TERM GOAL #1   Title Pt to be walking in her home with a cane    Time 2   Period Weeks   Status New     PT SHORT TERM GOAL #2   Title Pt to be able to single leg stance on her right leg for five seconds to decrease risk of falling,   Time 2   Period Weeks   Status New     PT SHORT TERM GOAL #3   Title Pt to be I in bed mobility (pt needs assist getting her right leg onto the bed )   Time 2   Period Weeks   Status New     PT SHORT TERM GOAL #4   Title Pt to be able to don and doff her shoes and socks    Time 2   Period Weeks   Status New                  Plan -  05/14/17 1913    Clinical Impression Statement Pt arrived today with report of improved hip pain overall. She continues to report consistent HEP adherence as well. Session focused on progressing LE strength and balance with pt demonstrating compensations secondary to remaining limitations in hip strength. Therapist updated pt's HEP to progress strength and proprioception and pt was able to demonstrate good technique and understanding. Ended session with muscle fatigue but no increase in pain. Will plan to re-evaluate pt at next visit to determine if there is a further need for skilled PT.    Rehab Potential Good   PT Frequency 2x / week   PT Duration 2 weeks   PT Treatment/Interventions ADLs/Self Care Home Management;Functional mobility training;Therapeutic activities;Gait training;Stair training;Therapeutic exercise;Balance training;Neuromuscular re-education;Patient/family education   PT Next Visit Plan re-evaluation.    PT Home Exercise Plan heel raises, minisquats, bridging, bent knee raise, hip abduction, self trigger point massage to glute med; SLS and sit to stand with band    Consulted and Agree with Plan of Care Patient      Patient will benefit from skilled therapeutic intervention in order to improve the following deficits and impairments:  Abnormal gait, Decreased activity tolerance, Decreased balance, Decreased range of motion, Decreased strength, Difficulty walking, Pain  Visit Diagnosis: Muscle weakness (generalized)  Stiffness of right hip, not elsewhere classified  Difficulty in walking, not elsewhere classified     Problem List Patient Active Problem List   Diagnosis Date Noted  . OA (osteoarthritis) of hip 04/26/2017  . Acute onset of severe vertigo 09/20/2016  . Abnormality of gait 09/20/2016  . OA (osteoarthritis) of knee 10/12/2015  . Pancytopenia ([place]) 10/17/2014  . Rheumatoid arthritis ([place]) 10/17/2014    7:14 PM,05/14/17 Elly Modena PT, DPT [facility] [address] [place], [place], [address] Phone: [phone]   Fax:  [phone]  Name: Melissa Villanueva MRN: [id number] Date of Birth: 12-Apr-1946

## 2017-05-16 DIAGNOSIS — M0589 Other rheumatoid arthritis with rheumatoid factor of multiple sites: Secondary | ICD-10-CM | POA: Diagnosis not present

## 2017-05-18 ENCOUNTER — Ambulatory Visit (HOSPITAL_COMMUNITY): Payer: Medicare Other | Admitting: Physical Therapy

## 2017-05-18 DIAGNOSIS — R262 Difficulty in walking, not elsewhere classified: Secondary | ICD-10-CM | POA: Diagnosis not present

## 2017-05-18 DIAGNOSIS — M25651 Stiffness of right hip, not elsewhere classified: Secondary | ICD-10-CM

## 2017-05-18 DIAGNOSIS — M6281 Muscle weakness (generalized): Secondary | ICD-10-CM

## 2017-05-18 NOTE — Therapy (Signed)
[place], [place], [address] Phone: [phone]   Fax:  [phone]  Physical Therapy Treatment/discharge.  Patient Details  Name: Melissa Villanueva MRN: [id number] Date of Birth: [birth year] Referring Provider: Hector Shade   Encounter Date: 05/18/2017      PT End of Session - 05/18/17 1420    Visit Number 6  per md 2x a week x 2 week    Number of Visits 6   Date for PT Re-Evaluation 05/31/17   Authorization Type medicare   Authorization - Visit Number 6   Authorization - Number of Visits 6   PT Start Time 1350   PT Stop Time 1420   PT Time Calculation (min) 30 min   Activity Tolerance Patient tolerated treatment well;No increased pain   Behavior During Therapy WFL for tasks assessed/performed      Past Medical History:  Diagnosis Date  . Arthritis   . Dizziness   . GERD (gastroesophageal reflux disease)   . Headache   . Pancytopenia ([place]) 2016  . PONV (postoperative nausea and vomiting)   . Stress incontinence     Past Surgical History:  Procedure Laterality Date  . CARPAL TUNNEL RELEASE     left   . CESAREAN SECTION    . CHOLECYSTECTOMY    . EYE SURGERY  Sept 2015   Bilateral Glaucoma with surgery  . TOTAL HIP ARTHROPLASTY Left   . TOTAL HIP ARTHROPLASTY Right 04/26/2017   Procedure: RIGHT TOTAL HIP ARTHROPLASTY ANTERIOR APPROACH;  Surgeon: Gaynelle Arabian, MD;  Location: [facility];  Service: Orthopedics;  Laterality: Right;  . TOTAL KNEE ARTHROPLASTY Right 10/12/2015   Procedure: RIGHT TOTAL KNEE ARTHROPLASTY;  Surgeon: Gaynelle Arabian, MD;  Location: [facility];  Service: Orthopedics;  Laterality: Right;    There were no vitals filed for this visit.      Subjective Assessment - 05/18/17 1351    Subjective Pt states that she is doing well.  She is not having any difficulty doing anything.     Pertinent History OA; Lt THR; Rt TKR    How long can you sit comfortably? able to sit for an hour    How long can you stand comfortably? Pt is able to stand for 30 minutes but tends to lean on her right leg    How long can you walk comfortably? Walking with her cane now  for 20 minutes was a  walker for about 15-20 minutes    Patient Stated Goals To walk with a cane, nothing if able    Currently in Pain? Yes   Pain Score 2    Pain Location Hip   Pain Orientation Right;Lateral   Pain Descriptors / Indicators Sore   Pain Type Chronic pain   Pain Onset 1 to 4 weeks ago   Aggravating Factors  exercises    Pain Relieving Factors ice   Effect of Pain on Daily Activities increase             [facility] PT Assessment - 05/18/17 0001      Assessment   Medical Diagnosis Rt THR anterior approach   Referring Provider Hector Shade    Onset Date/Surgical Date 04/26/17   Next MD Visit 04/26/2017   Prior Therapy acute     Precautions   Precautions Anterior Hip     Restrictions   Weight Bearing Restrictions No     Balance Screen   Has the patient fallen in the past 6  months No   Has the patient had a decrease in activity level because of a fear of falling?  No   Is the patient reluctant to leave their home because of a fear of falling?  No     Home Ecologist residence     Prior Function   Level of Independence Independent with community mobility with device     Cognition   Overall Cognitive Status Within Functional Limits for tasks assessed     Observation/Other Assessments   Focus on Therapeutic Outcomes (FOTO)  38     Functional Tests   Functional tests Single leg stance;Sit to Stand     Single Leg Stance   Comments Rt;  11 seconds  was 0      LT :11 seconds was 10     Sit to Stand   Comments able without using her hands was unable      Strength   Right Hip Flexion 4/5  was 5/5   Right Hip Extension 4/5   Right Hip ABduction 5/5  was 3-/5   Right Knee Flexion 5/5   Right Knee Extension 5/5  was 4/5      Bed Mobility   Bed Mobility Sit  to Supine   Sit to Supine 6: Modified independent (Device/Increase time)                             PT Education - 05/18/17 1419    Education provided Yes   Education Details Stairclimbint with cane; the need to continue bridges and working on her balance, the benefits of joining the silver sneaker program    Person(s) Educated Patient   Methods Explanation   Comprehension Verbalized understanding          PT Short Term Goals - 05/18/17 1402      PT SHORT TERM GOAL #1   Title Pt to be walking in her home with a cane    Time 2   Period Weeks   Status Achieved     PT SHORT TERM GOAL #2   Title Pt to be able to single leg stance on her right leg for five seconds to decrease risk of falling,   Time 2   Period Weeks   Status Achieved     PT SHORT TERM GOAL #3   Title Pt to be I in bed mobility (pt needs assist getting her right leg onto the bed )   Time 2   Period Weeks   Status Achieved     PT SHORT TERM GOAL #4   Title Pt to be able to don and doff her shoes and socks    Time 2   Period Weeks   Status Partially Met  partially met for tieing shoes.                  Plan - 05/18/17 1421    Clinical Impression Statement Pt reassessed with significant improvement with all areas of deficits.  Pt and therapist reviewed stair climbing, exercises that she should be continuing to do and joining silver sneakers.    Rehab Potential Good   PT Frequency 2x / week   PT Duration 2 weeks   PT Treatment/Interventions ADLs/Self Care Home Management;Functional mobility training;Therapeutic activities;Gait training;Stair training;Therapeutic exercise;Balance training;Neuromuscular re-education;Patient/family education   PT Next Visit Plan discharge    PT Home Exercise Plan heel raises, minisquats, bridging, bent  knee raise, hip abduction, self trigger point massage to glute med; SLS and sit to stand with band    Consulted and Agree with Plan of Care  Patient      Patient will benefit from skilled therapeutic intervention in order to improve the following deficits and impairments:  Abnormal gait, Decreased activity tolerance, Decreased balance, Decreased range of motion, Decreased strength, Difficulty walking, Pain  Visit Diagnosis: Muscle weakness (generalized)  Stiffness of right hip, not elsewhere classified  Difficulty in walking, not elsewhere classified       G-Codes - May 26, 2017 1403    Functional Limitation Mobility: Walking and moving around   Mobility: Walking and Moving Around Goal Status [phone]) At least 40 percent but less than 60 percent impaired, limited or restricted   Mobility: Walking and Moving Around Discharge Status [phone]) At least 20 percent but less than 40 percent impaired, limited or restricted      Problem List Patient Active Problem List   Diagnosis Date Noted  . OA (osteoarthritis) of hip 04/26/2017  . Acute onset of severe vertigo 09/20/2016  . Abnormality of gait 09/20/2016  . OA (osteoarthritis) of knee 10/12/2015  . Pancytopenia ([place]) 10/17/2014  . Rheumatoid arthritis [facility]) 10/17/2014  Rayetta Humphrey, PT CLT [phone] 05/26/2017, 2:23 PM  [facility] [address] [place], [place], [address] Phone: [phone]   Fax:  [phone]  Name: Melissa Villanueva MRN: [id number] Date of Birth: Jun 30, 1946  PHYSICAL THERAPY DISCHARGE SUMMARY  Visits from Start of Care: 5 plus evaluation Current functional level related to goals / functional outcomes: See above   Remaining deficits: See above   Education / Equipment: See above Plan: Patient agrees to discharge.  Patient goals were met. Patient is being discharged due to meeting the stated rehab goals.  ?????       Rayetta Humphrey, [place] CLT [phone]

## 2017-05-30 DIAGNOSIS — M1611 Unilateral primary osteoarthritis, right hip: Secondary | ICD-10-CM | POA: Diagnosis not present

## 2017-06-13 DIAGNOSIS — Z79899 Other long term (current) drug therapy: Secondary | ICD-10-CM | POA: Diagnosis not present

## 2017-06-13 DIAGNOSIS — M0589 Other rheumatoid arthritis with rheumatoid factor of multiple sites: Secondary | ICD-10-CM | POA: Diagnosis not present

## 2017-06-26 DIAGNOSIS — M8580 Other specified disorders of bone density and structure, unspecified site: Secondary | ICD-10-CM | POA: Diagnosis not present

## 2017-06-26 DIAGNOSIS — Z6832 Body mass index (BMI) 32.0-32.9, adult: Secondary | ICD-10-CM | POA: Diagnosis not present

## 2017-06-26 DIAGNOSIS — R5383 Other fatigue: Secondary | ICD-10-CM | POA: Diagnosis not present

## 2017-06-26 DIAGNOSIS — M150 Primary generalized (osteo)arthritis: Secondary | ICD-10-CM | POA: Diagnosis not present

## 2017-06-26 DIAGNOSIS — Z79899 Other long term (current) drug therapy: Secondary | ICD-10-CM | POA: Diagnosis not present

## 2017-06-26 DIAGNOSIS — M0589 Other rheumatoid arthritis with rheumatoid factor of multiple sites: Secondary | ICD-10-CM | POA: Diagnosis not present

## 2017-06-26 DIAGNOSIS — E669 Obesity, unspecified: Secondary | ICD-10-CM | POA: Diagnosis not present

## 2017-06-26 DIAGNOSIS — D61818 Other pancytopenia: Secondary | ICD-10-CM | POA: Diagnosis not present

## 2017-06-30 DIAGNOSIS — R739 Hyperglycemia, unspecified: Secondary | ICD-10-CM | POA: Diagnosis not present

## 2017-06-30 DIAGNOSIS — M069 Rheumatoid arthritis, unspecified: Secondary | ICD-10-CM | POA: Diagnosis not present

## 2017-07-04 DIAGNOSIS — E079 Disorder of thyroid, unspecified: Secondary | ICD-10-CM | POA: Diagnosis not present

## 2017-07-04 DIAGNOSIS — M069 Rheumatoid arthritis, unspecified: Secondary | ICD-10-CM | POA: Diagnosis not present

## 2017-07-04 DIAGNOSIS — R32 Unspecified urinary incontinence: Secondary | ICD-10-CM | POA: Diagnosis not present

## 2017-08-08 DIAGNOSIS — M0589 Other rheumatoid arthritis with rheumatoid factor of multiple sites: Secondary | ICD-10-CM | POA: Diagnosis not present

## 2017-08-15 DIAGNOSIS — Z961 Presence of intraocular lens: Secondary | ICD-10-CM | POA: Diagnosis not present

## 2017-08-15 DIAGNOSIS — H26492 Other secondary cataract, left eye: Secondary | ICD-10-CM | POA: Diagnosis not present

## 2017-08-22 ENCOUNTER — Encounter (HOSPITAL_COMMUNITY)
Admission: RE | Disposition: A | Discharge status: Home / Self Care | Payer: Self-pay | Source: Ambulatory Visit | Attending: Ophthalmology

## 2017-08-22 ENCOUNTER — Ambulatory Visit (HOSPITAL_COMMUNITY)
Admission: RE | Admit: 2017-08-22 | Discharge: 2017-08-22 | Disposition: A | Discharge status: Home / Self Care | Payer: Medicare Other | Source: Ambulatory Visit | Attending: Ophthalmology | Admitting: Ophthalmology

## 2017-08-22 DIAGNOSIS — M1990 Unspecified osteoarthritis, unspecified site: Secondary | ICD-10-CM | POA: Insufficient documentation

## 2017-08-22 DIAGNOSIS — N329 Bladder disorder, unspecified: Secondary | ICD-10-CM | POA: Insufficient documentation

## 2017-08-22 DIAGNOSIS — H26492 Other secondary cataract, left eye: Secondary | ICD-10-CM | POA: Diagnosis not present

## 2017-08-22 DIAGNOSIS — Z79899 Other long term (current) drug therapy: Secondary | ICD-10-CM | POA: Insufficient documentation

## 2017-08-22 HISTORY — PX: YAG LASER APPLICATION: SHX6189

## 2017-08-22 SURGERY — TREATMENT, USING YAG LASER
Anesthesia: LOCAL | Laterality: Left

## 2017-08-22 MED ORDER — CYCLOPENTOLATE-PHENYLEPHRINE 0.2-1 % OP SOLN
1.0000 [drp] | OPHTHALMIC | Status: AC
  Administered 2017-08-22 (×3): 1 [drp] via OPHTHALMIC

## 2017-08-22 MED ORDER — CYCLOPENTOLATE-PHENYLEPHRINE 0.2-1 % OP SOLN
OPHTHALMIC | Status: AC
  Filled 2017-08-22: qty 2

## 2017-08-22 NOTE — Op Note (Signed)
Delonta Yohannes T. Gershon Crane, MD  Procedure: Yag Capsulotomy  Yag Laser Self Test Completedyes. Procedure: Posterior Capsulotomy, Eye Protection Worn by Staff yes. Laser In Use Sign on Door yes.  Laser: Nd:YAG Spot Size: Fixed Burst Mode: III Power Setting: 3.4 mJ/burst Number of shots: 25 Total energy delivered: 80.63 mJ   The patient tolerated the procedure without difficulty. No complications were encountered.   The patient was discharged home with the instructions to continue all her current glaucoma medications, if any.   Patient instructed to go to office at 0100 for intraocular pressure check.  Patient verbalizes understanding of discharge instructions Yes.  .    Pre-Operative Diagnosis: After-Cataract, obscuring vision, 366.53 OS Post-Operative Diagnosis: After-Cataract, obscuring vision, 366.53 OS Date of Cataract Surgery: 07/30/2014

## 2017-08-22 NOTE — H&P (Signed)
The patient was re examined and there is no change in the patients condition since the original H and P. 

## 2017-08-22 NOTE — Discharge Instructions (Signed)
Melissa Villanueva  08/22/2017     Instructions    Activity: No Restrictions.   Diet: Resume Diet you were on at home.   Pain Medication: Tylenol if Needed.   CONTACT YOUR DOCTOR IF YOU HAVE PAIN, REDNESS IN YOUR EYE, OR DECREASED VISION.   Follow-up:today with Rutherford Guys, MD.   Dr. Gershon Crane: [phone]  Dr. Iona Hansen: [phone]  Dr. Geoffry Paradise: [phone]   If you find that you cannot contact your physician, but feel that your signs and   Symptoms warrant a physician's attention, call the Emergency Room at   [phone] ext.[phone].   Othern/a.   FOLLOW UP WITH DR Gershon Crane TODAY BETWEEN 1-1:30 PM

## 2017-08-23 ENCOUNTER — Encounter (HOSPITAL_COMMUNITY): Payer: Self-pay | Admitting: Ophthalmology

## 2017-08-30 DIAGNOSIS — Z23 Encounter for immunization: Secondary | ICD-10-CM | POA: Diagnosis not present

## 2017-10-03 DIAGNOSIS — M0589 Other rheumatoid arthritis with rheumatoid factor of multiple sites: Secondary | ICD-10-CM | POA: Diagnosis not present

## 2017-10-27 DIAGNOSIS — E669 Obesity, unspecified: Secondary | ICD-10-CM | POA: Diagnosis not present

## 2017-10-27 DIAGNOSIS — Z6834 Body mass index (BMI) 34.0-34.9, adult: Secondary | ICD-10-CM | POA: Diagnosis not present

## 2017-10-27 DIAGNOSIS — M150 Primary generalized (osteo)arthritis: Secondary | ICD-10-CM | POA: Diagnosis not present

## 2017-10-27 DIAGNOSIS — D61818 Other pancytopenia: Secondary | ICD-10-CM | POA: Diagnosis not present

## 2017-10-27 DIAGNOSIS — R5383 Other fatigue: Secondary | ICD-10-CM | POA: Diagnosis not present

## 2017-10-27 DIAGNOSIS — M8580 Other specified disorders of bone density and structure, unspecified site: Secondary | ICD-10-CM | POA: Diagnosis not present

## 2017-10-27 DIAGNOSIS — Z79899 Other long term (current) drug therapy: Secondary | ICD-10-CM | POA: Diagnosis not present

## 2017-10-27 DIAGNOSIS — M0589 Other rheumatoid arthritis with rheumatoid factor of multiple sites: Secondary | ICD-10-CM | POA: Diagnosis not present

## 2017-11-01 ENCOUNTER — Emergency Department (HOSPITAL_COMMUNITY): Payer: Medicare Other

## 2017-11-01 ENCOUNTER — Encounter (HOSPITAL_COMMUNITY): Payer: Self-pay | Admitting: Emergency Medicine

## 2017-11-01 ENCOUNTER — Other Ambulatory Visit: Payer: Self-pay

## 2017-11-01 ENCOUNTER — Emergency Department (HOSPITAL_COMMUNITY)
Admission: EM | Admit: 2017-11-01 | Discharge: 2017-11-01 | Disposition: A | Discharge status: Home / Self Care | Payer: Medicare Other | Attending: Emergency Medicine | Admitting: Emergency Medicine

## 2017-11-01 DIAGNOSIS — R079 Chest pain, unspecified: Secondary | ICD-10-CM | POA: Diagnosis not present

## 2017-11-01 DIAGNOSIS — Z87891 Personal history of nicotine dependence: Secondary | ICD-10-CM | POA: Diagnosis not present

## 2017-11-01 DIAGNOSIS — Z79899 Other long term (current) drug therapy: Secondary | ICD-10-CM | POA: Diagnosis not present

## 2017-11-01 DIAGNOSIS — Z7982 Long term (current) use of aspirin: Secondary | ICD-10-CM | POA: Insufficient documentation

## 2017-11-01 DIAGNOSIS — R42 Dizziness and giddiness: Secondary | ICD-10-CM | POA: Insufficient documentation

## 2017-11-01 DIAGNOSIS — R002 Palpitations: Secondary | ICD-10-CM | POA: Insufficient documentation

## 2017-11-01 DIAGNOSIS — Z96643 Presence of artificial hip joint, bilateral: Secondary | ICD-10-CM | POA: Insufficient documentation

## 2017-11-01 LAB — URINALYSIS, ROUTINE W REFLEX MICROSCOPIC
Bilirubin Urine: NEGATIVE
GLUCOSE, UA: NEGATIVE mg/dL
HGB URINE DIPSTICK: NEGATIVE
Ketones, ur: NEGATIVE mg/dL
NITRITE: POSITIVE — AB
Protein, ur: NEGATIVE mg/dL
SPECIFIC GRAVITY, URINE: 1.01 (ref 1.005–1.030)
pH: 7 (ref 5.0–8.0)

## 2017-11-01 LAB — BASIC METABOLIC PANEL
ANION GAP: 8 (ref 5–15)
BUN: 19 mg/dL (ref 6–20)
CHLORIDE: 104 mmol/L (ref 101–111)
CO2: 28 mmol/L (ref 22–32)
CREATININE: 0.85 mg/dL (ref 0.44–1.00)
Calcium: 9.6 mg/dL (ref 8.9–10.3)
GFR calc non Af Amer: >60 mL/min (ref >60)
Glucose, Bld: 95 mg/dL (ref 65–99)
Potassium: 3.4 mmol/L — ABNORMAL LOW (ref 3.5–5.1)
SODIUM: 140 mmol/L (ref 135–145)

## 2017-11-01 LAB — CBC WITH DIFFERENTIAL/PLATELET
BASOS ABS: 0 10*3/uL (ref 0.0–0.1)
BASOS PCT: 1 %
EOS PCT: 2 %
Eosinophils Absolute: 0.1 10*3/uL (ref 0.0–0.7)
HEMATOCRIT: 39.1 % (ref 36.0–46.0)
Hemoglobin: 12.4 g/dL (ref 12.0–15.0)
Lymphocytes Relative: 22 %
Lymphs Abs: 0.8 10*3/uL (ref 0.7–4.0)
MCH: 32.2 pg (ref 26.0–34.0)
MCHC: 31.7 g/dL (ref 30.0–36.0)
MCV: 101.6 fL — ABNORMAL HIGH (ref 78.0–100.0)
MONO ABS: 0.3 10*3/uL (ref 0.1–1.0)
Monocytes Relative: 9 %
NEUTROS ABS: 2.5 10*3/uL (ref 1.7–7.7)
Neutrophils Relative %: 66 %
PLATELETS: 166 10*3/uL (ref 150–400)
RBC: 3.85 MIL/uL — ABNORMAL LOW (ref 3.87–5.11)
RDW: 15.7 % — AB (ref 11.5–15.5)
WBC: 3.7 10*3/uL — ABNORMAL LOW (ref 4.0–10.5)

## 2017-11-01 LAB — TSH: TSH: 0.432 u[IU]/mL (ref 0.350–4.500)

## 2017-11-01 LAB — I-STAT TROPONIN, ED: TROPONIN I, POC: 0.01 ng/mL (ref 0.00–0.08)

## 2017-11-01 MED ORDER — FLUCONAZOLE 100 MG PO TABS
ORAL_TABLET | ORAL | Status: AC
  Filled 2017-11-01: qty 2

## 2017-11-01 MED ORDER — SODIUM CHLORIDE 0.9 % IV BOLUS (SEPSIS)
1000.0000 mL | Freq: Once | INTRAVENOUS | Status: AC
  Administered 2017-11-01: 1000 mL via INTRAVENOUS

## 2017-11-01 MED ORDER — FLUCONAZOLE 150 MG PO TABS: 150.0000 mg | ORAL_TABLET | Freq: Once | ORAL | Status: DC

## 2017-11-01 NOTE — ED Notes (Signed)
Error, blood not obtained with iv. Awaiting phlebotomy.

## 2017-11-01 NOTE — ED Notes (Signed)
Pt returned from xray

## 2017-11-01 NOTE — ED Triage Notes (Signed)
Pt c/o 2 episodes this am of feeling flushed/dizzy and heart beating fast. Episodes lasting approx 3 min per pt. Pt arrived a/o. Nad. Non diaphoretic. Denies any pain in chest at any time, just the heart beat feeling fast. Pt only c/o now is "just a little shaky". Ambulated to bed without difficulty.

## 2017-11-01 NOTE — ED Provider Notes (Signed)
[facility] EMERGENCY DEPARTMENT Provider Note   CSN: [id number] Arrival date & time: 11/01/17  5852     History   Chief Complaint Chief Complaint  Patient presents with  . Chest Pain  . Dizziness    HPI MIALEE WEYMAN is a 71 y.o. female.  Pt presents to the ED today with dizziness and heart racing.  Pt said she was eating breakfast when sx started.  She felt very shaky and felt like the room was spinning and was afraid she was having a stroke.  She said she did not talk or move, so did not know if she had deficits there.  Sx lasted about 3 minutes.  She denies cp.  She is feeling better now, just shaky.      Past Medical History:  Diagnosis Date  . Arthritis   . Dizziness   . GERD (gastroesophageal reflux disease)   . Headache   . Pancytopenia ([place]) 2016  . PONV (postoperative nausea and vomiting)   . Stress incontinence     Patient Active Problem List   Diagnosis Date Noted  . OA (osteoarthritis) of hip 04/26/2017  . Acute onset of severe vertigo 09/20/2016  . Abnormality of gait 09/20/2016  . OA (osteoarthritis) of knee 10/12/2015  . Pancytopenia ([place]) 10/17/2014  . Rheumatoid arthritis ([place]) 10/17/2014    Past Surgical History:  Procedure Laterality Date  . CARPAL TUNNEL RELEASE     left   . CESAREAN SECTION    . CHOLECYSTECTOMY    . EYE SURGERY  Sept 2015   Bilateral Glaucoma with surgery  . TOTAL HIP ARTHROPLASTY Left   . TOTAL HIP ARTHROPLASTY Right 04/26/2017   Procedure: RIGHT TOTAL HIP ARTHROPLASTY ANTERIOR APPROACH;  Surgeon: Gaynelle Arabian, MD;  Location: [facility];  Service: Orthopedics;  Laterality: Right;  . TOTAL KNEE ARTHROPLASTY Right 10/12/2015   Procedure: RIGHT TOTAL KNEE ARTHROPLASTY;  Surgeon: Gaynelle Arabian, MD;  Location: [facility];  Service: Orthopedics;  Laterality: Right;  . YAG LASER APPLICATION Left 7/78/2423   Procedure: YAG LASER APPLICATION;  Surgeon: Rutherford Guys, MD;  Location: [facility];  Service: Ophthalmology;  Laterality:  Left;    OB History    No data available       Home Medications    Prior to Admission medications   Medication Sig Start Date End Date Taking? Authorizing Provider  alendronate (FOSAMAX) 70 MG tablet Take 1 tablet by mouth once a week. 08/30/17  Yes [provider]  aspirin EC 81 MG tablet Take 81 mg by mouth daily.   Yes [provider]  Calcium Carbonate-Vitamin D3 (CALCIUM 600-D) 600-400 MG-UNIT TABS Take 1 tablet by mouth daily.   Yes [provider]  famotidine (PEPCID) 40 MG tablet Take 40 mg by mouth daily.   Yes [provider]  meclizine (ANTIVERT) 25 MG tablet Take 25 mg by mouth 2 (two) times daily as needed for dizziness.  08/23/16  Yes [provider]  solifenacin (VESICARE) 5 MG tablet Take 5 mg by mouth every evening.    Yes [provider]  iron polysaccharides (NIFEREX) 150 MG capsule Take 1 capsule (150 mg total) by mouth 2 (two) times daily. Patient not taking: Reported on 11/01/2017 04/27/17   Joelene Millin, PA-C    Family History Family History  Problem Relation Age of Onset  . Heart disease Mother   . Cirrhosis Father   . Heart attack Brother   . Cancer Maternal Aunt  Lung  . Cancer Paternal Aunt        Gastric  . Cancer Brother     Social History Social History   Tobacco Use  . Smoking status: Former Smoker    Last attempt to quit: 07/11/1986    Years since quitting: 31.3  . Smokeless tobacco: Never Used  Substance Use Topics  . Alcohol use: No  . Drug use: No     Allergies   Leflunomide   Review of Systems Review of Systems  Cardiovascular: Positive for palpitations.  Neurological: Positive for dizziness and weakness.  All other systems reviewed and are negative.    Physical Exam Updated Vital Signs BP 122/70   Pulse 72   Temp 98 F (36.7 C) (Oral)   Resp 15   Ht 5\' 2"  (1.575 m)   Wt 83 kg (183 lb)   SpO2 97%   BMI 33.47 kg/m   Physical Exam    Constitutional: She is oriented to person, place, and time. She appears well-developed and well-nourished.  HENT:  Head: Normocephalic and atraumatic.  Eyes: EOM are normal. Pupils are equal, round, and reactive to light.  Neck: Normal range of motion. Neck supple.  Cardiovascular: Normal rate, regular rhythm, intact distal pulses and normal pulses.  Pulmonary/Chest: Effort normal and breath sounds normal.  Abdominal: Soft. Bowel sounds are normal.  Musculoskeletal: Normal range of motion.  Neurological: She is alert and oriented to person, place, and time.  Skin: Skin is warm and dry. Capillary refill takes less than 2 seconds.  Psychiatric: She has a normal mood and affect. Her behavior is normal.  Nursing note and vitals reviewed.    ED Treatments / Results  Labs (all labs ordered are listed, but only abnormal results are displayed) Labs Reviewed  BASIC METABOLIC PANEL - Abnormal; Notable for the following components:      Result Value   Potassium 3.4 (*)    All other components within normal limits  CBC WITH DIFFERENTIAL/PLATELET - Abnormal; Notable for the following components:   WBC 3.7 (*)    RBC 3.85 (*)    MCV 101.6 (*)    RDW 15.7 (*)    All other components within normal limits  URINALYSIS, ROUTINE W REFLEX MICROSCOPIC - Abnormal; Notable for the following components:   APPearance HAZY (*)    Nitrite POSITIVE (*)    Leukocytes, UA MODERATE (*)    Bacteria, UA RARE (*)    Squamous Epithelial / LPF 0-5 (*)    All other components within normal limits  TSH  I-STAT TROPONIN, ED    EKG  EKG Interpretation  Date/Time:  Wednesday November 01 2017 08:30:43 EST Ventricular Rate:  73 PR Interval:    QRS Duration: 106 QT Interval:  414 QTC Calculation: 457 R Axis:   -24 Text Interpretation:  Sinus rhythm Abnormal R-wave progression, late transition LVH with secondary repolarization abnormality No old tracing to compare Confirmed by Isla Pence [phone]) on  11/01/2017 8:40:37 AM       Radiology Dg Chest 2 View  Result Date: 11/01/2017 CLINICAL DATA:  Chest pain and dizziness EXAM: CHEST  2 VIEW COMPARISON:  None. FINDINGS: Cardiac shadow is at the upper limits of normal in size. The lungs are well aerated bilaterally. No focal infiltrate or sizable effusion is seen. Degenerative change of the thoracic spine is noted. No other focal abnormality is seen. IMPRESSION: No acute abnormality noted. Electronically Signed   By: Inez Catalina M.D.   On: 11/01/2017  09:28   Ct Head Wo Contrast  Result Date: 11/01/2017 CLINICAL DATA:  71 year old female with tachycardia and dizziness this morning. Vertigo. EXAM: CT HEAD WITHOUT CONTRAST TECHNIQUE: Contiguous axial images were obtained from the base of the skull through the vertex without intravenous contrast. COMPARISON:  Brain MRI 09/26/2016. Head CT without contrast 09/02/2015. FINDINGS: Brain: Cerebral volume is stable since 2016. Mild for age patchy white matter hypodensity is stable. No midline shift, ventriculomegaly, mass effect, evidence of mass lesion, intracranial hemorrhage or evidence of cortically based acute infarction. No cortical encephalomalacia identified. Vascular: Calcified atherosclerosis at the skull base. No suspicious intracranial vascular hyperdensity. Skull: No acute osseous abnormality identified. Sinuses/Orbits: Visualized paranasal sinuses and mastoids are stable and well pneumatized. Other: No acute orbit or scalp soft tissue findings. IMPRESSION: Stable since 2016 and largely unremarkable for age noncontrast CT appearance of the brain. No acute intracranial abnormality. Electronically Signed   By: Genevie Ann M.D.   On: 11/01/2017 11:04    Procedures Procedures (including critical care time)  Medications Ordered in ED Medications  fluconazole (DIFLUCAN) tablet 150 mg (not administered)  sodium chloride 0.9 % bolus 1,000 mL (0 mLs Intravenous Stopped 11/01/17 1000)     Initial  Impression / Assessment and Plan / ED Course  I have reviewed the triage vital signs and the nursing notes.  Pertinent labs & imaging results that were available during my care of the patient were reviewed by me and considered in my medical decision making (see chart for details).    Pt is feeling much better after 1L NS.  Etiology of sx are unclear.  The pt does have some yeast in her urine, so she will be given diflucan prior to d/c.  She knows to return if worse and to f/u with pcp.  Final Clinical Impressions(s) / ED Diagnoses   Final diagnoses:  Dizziness  Palpitations    ED Discharge Orders    None       Isla Pence, MD 11/01/17 1114

## 2017-11-01 NOTE — ED Notes (Signed)
Pt taken to ct 

## 2017-11-01 NOTE — ED Notes (Signed)
Pt taken to xray 

## 2017-11-16 ENCOUNTER — Encounter (HOSPITAL_COMMUNITY): Payer: Medicare Other | Attending: Oncology | Admitting: Oncology

## 2017-11-16 ENCOUNTER — Encounter (HOSPITAL_COMMUNITY): Payer: Self-pay | Admitting: Oncology

## 2017-11-16 ENCOUNTER — Other Ambulatory Visit: Payer: Self-pay

## 2017-11-16 ENCOUNTER — Encounter (HOSPITAL_COMMUNITY): Payer: Medicare Other

## 2017-11-16 VITALS — BP 146/84 | HR 67 | Temp 97.9°F | Resp 18 | Wt 184.8 lb

## 2017-11-16 DIAGNOSIS — D61818 Other pancytopenia: Secondary | ICD-10-CM

## 2017-11-16 DIAGNOSIS — M069 Rheumatoid arthritis, unspecified: Secondary | ICD-10-CM | POA: Diagnosis not present

## 2017-11-16 DIAGNOSIS — Z96643 Presence of artificial hip joint, bilateral: Secondary | ICD-10-CM | POA: Insufficient documentation

## 2017-11-16 DIAGNOSIS — M7989 Other specified soft tissue disorders: Secondary | ICD-10-CM | POA: Insufficient documentation

## 2017-11-16 DIAGNOSIS — Z8379 Family history of other diseases of the digestive system: Secondary | ICD-10-CM | POA: Insufficient documentation

## 2017-11-16 DIAGNOSIS — Z9049 Acquired absence of other specified parts of digestive tract: Secondary | ICD-10-CM | POA: Diagnosis not present

## 2017-11-16 DIAGNOSIS — Z79899 Other long term (current) drug therapy: Secondary | ICD-10-CM | POA: Diagnosis not present

## 2017-11-16 DIAGNOSIS — K219 Gastro-esophageal reflux disease without esophagitis: Secondary | ICD-10-CM | POA: Diagnosis not present

## 2017-11-16 DIAGNOSIS — Z96651 Presence of right artificial knee joint: Secondary | ICD-10-CM | POA: Diagnosis not present

## 2017-11-16 DIAGNOSIS — Z87891 Personal history of nicotine dependence: Secondary | ICD-10-CM | POA: Insufficient documentation

## 2017-11-16 DIAGNOSIS — Z8249 Family history of ischemic heart disease and other diseases of the circulatory system: Secondary | ICD-10-CM | POA: Diagnosis not present

## 2017-11-16 LAB — COMPREHENSIVE METABOLIC PANEL
ALT: 13 U/L — ABNORMAL LOW (ref 14–54)
AST: 22 U/L (ref 15–41)
Albumin: 4.3 g/dL (ref 3.5–5.0)
Alkaline Phosphatase: 58 U/L (ref 38–126)
Anion gap: 9 (ref 5–15)
BILIRUBIN TOTAL: 0.7 mg/dL (ref 0.3–1.2)
BUN: 15 mg/dL (ref 6–20)
CHLORIDE: 99 mmol/L — AB (ref 101–111)
CO2: 30 mmol/L (ref 22–32)
Calcium: 10 mg/dL (ref 8.9–10.3)
Creatinine, Ser: 0.84 mg/dL (ref 0.44–1.00)
Glucose, Bld: 105 mg/dL — ABNORMAL HIGH (ref 65–99)
POTASSIUM: 3.5 mmol/L (ref 3.5–5.1)
Sodium: 138 mmol/L (ref 135–145)
TOTAL PROTEIN: 7.5 g/dL (ref 6.5–8.1)

## 2017-11-16 LAB — CBC WITH DIFFERENTIAL/PLATELET
Basophils Absolute: 0 10*3/uL (ref 0.0–0.1)
Basophils Relative: 1 %
Eosinophils Absolute: 0 10*3/uL (ref 0.0–0.7)
Eosinophils Relative: 1 %
HCT: 40.6 % (ref 36.0–46.0)
Hemoglobin: 13 g/dL (ref 12.0–15.0)
LYMPHS ABS: 1.3 10*3/uL (ref 0.7–4.0)
LYMPHS PCT: 26 %
MCH: 32.3 pg (ref 26.0–34.0)
MCHC: 32 g/dL (ref 30.0–36.0)
MCV: 100.7 fL — AB (ref 78.0–100.0)
MONO ABS: 0.2 10*3/uL (ref 0.1–1.0)
MONOS PCT: 4 %
Neutro Abs: 3.4 10*3/uL (ref 1.7–7.7)
Neutrophils Relative %: 68 %
Platelets: 181 10*3/uL (ref 150–400)
RBC: 4.03 MIL/uL (ref 3.87–5.11)
RDW: 14.9 % (ref 11.5–15.5)
WBC: 5 10*3/uL (ref 4.0–10.5)

## 2017-11-16 LAB — IRON AND TIBC
Iron: 103 ug/dL (ref 28–170)
SATURATION RATIOS: 26 % (ref 10.4–31.8)
TIBC: 395 ug/dL (ref 250–450)
UIBC: 292 ug/dL

## 2017-11-16 LAB — FOLATE: FOLATE: 34 ng/mL (ref >5.9)

## 2017-11-16 LAB — VITAMIN B12: VITAMIN B 12: 441 pg/mL (ref 180–914)

## 2017-11-16 LAB — FERRITIN: Ferritin: 66 ng/mL (ref 11–307)

## 2017-11-16 NOTE — Patient Instructions (Signed)
[facility] at [facility] Discharge Instructions  RECOMMENDATIONS MADE BY THE CONSULTANT AND ANY TEST RESULTS WILL BE SENT TO YOUR REFERRING PHYSICIAN.  You were seen today by Dr. Louise Zhou    Thank you for choosing [facility] at [facility] to provide your oncology and hematology care.  To afford each patient quality time with our provider, please arrive at least 15 minutes before your scheduled appointment time.    If you have a lab appointment with the Cancer Center please come in thru the  Main Entrance and check in at the main information desk  You need to re-schedule your appointment should you arrive 10 or more minutes late.  We strive to give you quality time with our providers, and arriving late affects you and other patients whose appointments are after yours.  Also, if you no show three or more times for appointments you may be dismissed from the clinic at the providers discretion.     Again, thank you for choosing [facility].  Our hope is that these requests will decrease the amount of time that you wait before being seen by our physicians.       _____________________________________________________________  Should you have questions after your visit to [facility], please contact our office at [phone] between the hours of 8:30 a.m. and 4:30 p.m.  Voicemails left after 4:30 p.m. will not be returned until the following business day.  For prescription refill requests, have your pharmacy contact our office.       Resources For Cancer Patients and their Caregivers ? American Cancer Society: Can assist with transportation, wigs, general needs, runs Look Good Feel Better.        1-888-227-6333 ? Cancer Care: Provides financial assistance, online support groups, medication/co-pay assistance.  1-800-813-HOPE (4673) ? [facility] Assists [place] cancer patients and their  families through emotional , educational and financial support.  336-427-4357 ? [place] DSS Where to apply for food stamps, Medicaid and utility assistance. 336-342-1394 ? [facility]: Transportation to medical appointments. 336-347-2287 ? Social Security Administration: May apply for disability if have a Stage IV cancer. 336-342-7796 1-800-772-1213 ? [place] Aging, Disability and Transit Services: Assists with nutrition, care and transit needs. 336-349-2343  Cancer Center Support Programs: [email or web address]@ > Cancer Support Group  2nd Tuesday of the month 1pm-2pm, Journey Room  > Creative Journey  3rd Tuesday of the month 1130am-1pm, Journey Room  > Look Good Feel Better  1st Wednesday of the month 10am-12 noon, Journey Room (Call American Cancer Society to register 1-800-395-5775)    

## 2017-11-16 NOTE — Progress Notes (Signed)
Melissa Villanueva, [address] [address]  Pancytopenia [facility]) - Plan: CBC with Differential, Comprehensive metabolic panel  CURRENT THERAPY: Observation  INTERVAL HISTORY: Melissa Villanueva 71 y.o. female returns for followup of h/o pancytopenia in the setting of RA previously treated with Plaquenil and MTX.   Patient states she is doing well. She is on 2 new RA infusion meds which are controlling her RA symptoms. She states she has some intermittent leg swelling. She denies any pain. She states her appetite and energy levels are good.   Review of Systems  Constitutional: Negative.  Negative for chills, fever and weight loss.  HENT: Negative.   Eyes: Negative.   Respiratory: Negative.  Negative for cough.   Cardiovascular: Negative.  Negative for chest pain.  Gastrointestinal: Negative.  Negative for blood in stool, constipation, diarrhea, melena, nausea and vomiting.  Genitourinary: Negative.   Musculoskeletal: Negative.   Skin: Negative.   Neurological: Negative.  Negative for weakness.  Endo/Heme/Allergies: Negative.   Psychiatric/Behavioral: Negative.     Past Medical History:  Diagnosis Date  . Arthritis   . Dizziness   . GERD (gastroesophageal reflux disease)   . Headache   . Pancytopenia ([place]) 2016  . PONV (postoperative nausea and vomiting)   . Stress incontinence     Past Surgical History:  Procedure Laterality Date  . CARPAL TUNNEL RELEASE     left   . CESAREAN SECTION    . CHOLECYSTECTOMY    . EYE SURGERY  Sept 2015   Bilateral Glaucoma with surgery  . TOTAL HIP ARTHROPLASTY Left   . TOTAL HIP ARTHROPLASTY Right 04/26/2017   Procedure: RIGHT TOTAL HIP ARTHROPLASTY ANTERIOR APPROACH;  Surgeon: Gaynelle Arabian, MD;  Location: [facility];  Service: Orthopedics;  Laterality: Right;  . TOTAL KNEE ARTHROPLASTY Right 10/12/2015   Procedure: RIGHT TOTAL KNEE ARTHROPLASTY;  Surgeon: Gaynelle Arabian, MD;  Location: [facility];  Service:  Orthopedics;  Laterality: Right;  . YAG LASER APPLICATION Left 8/41/3244   Procedure: YAG LASER APPLICATION;  Surgeon: Rutherford Guys, MD;  Location: [facility];  Service: Ophthalmology;  Laterality: Left;    Family History  Problem Relation Age of Onset  . Heart disease Mother   . Cirrhosis Father   . Heart attack Brother   . Cancer Maternal Aunt        Lung  . Cancer Paternal Aunt        Gastric  . Cancer Brother     Social History   Socioeconomic History  . Marital status: Widowed    Spouse name: None  . Number of children: 5  . Years of education: HS  . Highest education level: None  Social Needs  . Financial resource strain: None  . Food insecurity - worry: None  . Food insecurity - inability: None  . Transportation needs - medical: None  . Transportation needs - non-medical: None  Occupational History  . Occupation: Retired  Tobacco Use  . Smoking status: Former Smoker    Last attempt to quit: 07/11/1986    Years since quitting: 31.3  . Smokeless tobacco: Never Used  Substance and Sexual Activity  . Alcohol use: No  . Drug use: No  . Sexual activity: None  Other Topics Concern  . None  Social History Narrative   Lives at home alone.   Right-handed.   1 cup caffeine daily.     PHYSICAL EXAMINATION  ECOG PERFORMANCE STATUS: 1 - Symptomatic  but completely ambulatory  Vitals:   11/16/17 1416  BP: (!) 146/84  Pulse: 67  Resp: 18  Temp: 97.9 F (36.6 C)  SpO2: 100%   Constitutional: Well-developed, well-nourished, and in no distress.   HENT:  Head: Normocephalic and atraumatic.  Mouth/Throat: No oropharyngeal exudate. Mucosa moist. Eyes: Pupils are equal, round, and reactive to light. Conjunctivae are normal. No scleral icterus.  Neck: Normal range of motion. Neck supple. No JVD present.  Cardiovascular: Normal rate, regular rhythm and normal heart sounds.  Exam reveals no gallop and no friction rub.   No murmur heard. Pulmonary/Chest: Effort normal  and breath sounds normal. No respiratory distress. No wheezes.No rales.  Abdominal: Soft. Bowel sounds are normal. No distension. There is no tenderness. There is no guarding.  Musculoskeletal: No edema or tenderness.  Lymphadenopathy:    No cervical or supraclavicular adenopathy.  Neurological: Alert and oriented to person, place, and time. No cranial nerve deficit.  Skin: Skin is warm and dry. No rash noted. No erythema. No pallor.  Psychiatric: Affect and judgment normal.     LABORATORY DATA: CBC    Component Value Date/Time   WBC 5.0 11/16/2017 1329   RBC 4.03 11/16/2017 1329   HGB 13.0 11/16/2017 1329   HCT 40.6 11/16/2017 1329   PLT 181 11/16/2017 1329   MCV 100.7 (H) 11/16/2017 1329   MCH 32.3 11/16/2017 1329   MCHC 32.0 11/16/2017 1329   RDW 14.9 11/16/2017 1329   LYMPHSABS 1.3 11/16/2017 1329   MONOABS 0.2 11/16/2017 1329   EOSABS 0.0 11/16/2017 1329   BASOSABS 0.0 11/16/2017 1329      Chemistry      Component Value Date/Time   NA 138 11/16/2017 1329   K 3.5 11/16/2017 1329   CL 99 (L) 11/16/2017 1329   CO2 30 11/16/2017 1329   BUN 15 11/16/2017 1329   CREATININE 0.84 11/16/2017 1329      Component Value Date/Time   CALCIUM 10.0 11/16/2017 1329   ALKPHOS 58 11/16/2017 1329   AST 22 11/16/2017 1329   ALT 13 (L) 11/16/2017 1329   BILITOT 0.7 11/16/2017 1329        PENDING LABS:   RADIOGRAPHIC STUDIES:  Dg Chest 2 View  Result Date: 11/01/2017 CLINICAL DATA:  Chest pain and dizziness EXAM: CHEST  2 VIEW COMPARISON:  None. FINDINGS: Cardiac shadow is at the upper limits of normal in size. The lungs are well aerated bilaterally. No focal infiltrate or sizable effusion is seen. Degenerative change of the thoracic spine is noted. No other focal abnormality is seen. IMPRESSION: No acute abnormality noted. Electronically Signed   By: Inez Catalina M.D.   On: 11/01/2017 09:28   Ct Head Wo Contrast  Result Date: 11/01/2017 CLINICAL DATA:  71 year old female  with tachycardia and dizziness this morning. Vertigo. EXAM: CT HEAD WITHOUT CONTRAST TECHNIQUE: Contiguous axial images were obtained from the base of the skull through the vertex without intravenous contrast. COMPARISON:  Brain MRI 09/26/2016. Head CT without contrast 09/02/2015. FINDINGS: Brain: Cerebral volume is stable since 2016. Mild for age patchy white matter hypodensity is stable. No midline shift, ventriculomegaly, mass effect, evidence of mass lesion, intracranial hemorrhage or evidence of cortically based acute infarction. No cortical encephalomalacia identified. Vascular: Calcified atherosclerosis at the skull base. No suspicious intracranial vascular hyperdensity. Skull: No acute osseous abnormality identified. Sinuses/Orbits: Visualized paranasal sinuses and mastoids are stable and well pneumatized. Other: No acute orbit or scalp soft tissue findings. IMPRESSION: Stable since 2016 and  largely unremarkable for age noncontrast CT appearance of the brain. No acute intracranial abnormality. Electronically Signed   By: Genevie Ann M.D.   On: 11/01/2017 11:04     PATHOLOGY:    ASSESSMENT AND PLAN:  Pancytopenia related RA medications, patient previously on plaquenil and methotrexate. Pancytopenia now resolved.  PLAN: -Reviewed her CBC with her in detail. CBC has normalized.  -RTC in 1 year with labs for follow up. She knows to come back sooner if her blood counts drop dramatically. She states her rheumatologist monitors her blood work on a regular basis.   ORDERS PLACED FOR THIS ENCOUNTER: Orders Placed This Encounter  Procedures  . CBC with Differential  . Comprehensive metabolic panel     THERAPY PLAN:  We will continue to monitor counts.   All questions were answered. The patient knows to call the clinic with any problems, questions or concerns. We can certainly see the patient much sooner if necessary.  This note is electronically signed by: Twana First, MD 11/16/2017 2:31  PM

## 2017-11-30 DIAGNOSIS — M0589 Other rheumatoid arthritis with rheumatoid factor of multiple sites: Secondary | ICD-10-CM | POA: Diagnosis not present

## 2018-01-18 DIAGNOSIS — Z6832 Body mass index (BMI) 32.0-32.9, adult: Secondary | ICD-10-CM | POA: Diagnosis not present

## 2018-01-18 DIAGNOSIS — R32 Unspecified urinary incontinence: Secondary | ICD-10-CM | POA: Diagnosis not present

## 2018-01-18 DIAGNOSIS — R300 Dysuria: Secondary | ICD-10-CM | POA: Diagnosis not present

## 2018-01-18 DIAGNOSIS — N390 Urinary tract infection, site not specified: Secondary | ICD-10-CM | POA: Diagnosis not present

## 2018-01-18 DIAGNOSIS — E6609 Other obesity due to excess calories: Secondary | ICD-10-CM | POA: Diagnosis not present

## 2018-01-25 DIAGNOSIS — M0589 Other rheumatoid arthritis with rheumatoid factor of multiple sites: Secondary | ICD-10-CM | POA: Diagnosis not present

## 2018-02-26 DIAGNOSIS — M150 Primary generalized (osteo)arthritis: Secondary | ICD-10-CM | POA: Diagnosis not present

## 2018-02-26 DIAGNOSIS — E669 Obesity, unspecified: Secondary | ICD-10-CM | POA: Diagnosis not present

## 2018-02-26 DIAGNOSIS — Z6836 Body mass index (BMI) 36.0-36.9, adult: Secondary | ICD-10-CM | POA: Diagnosis not present

## 2018-02-26 DIAGNOSIS — R5383 Other fatigue: Secondary | ICD-10-CM | POA: Diagnosis not present

## 2018-02-26 DIAGNOSIS — M0589 Other rheumatoid arthritis with rheumatoid factor of multiple sites: Secondary | ICD-10-CM | POA: Diagnosis not present

## 2018-02-26 DIAGNOSIS — I7300 Raynaud's syndrome without gangrene: Secondary | ICD-10-CM | POA: Diagnosis not present

## 2018-02-26 DIAGNOSIS — D61818 Other pancytopenia: Secondary | ICD-10-CM | POA: Diagnosis not present

## 2018-02-26 DIAGNOSIS — Z79899 Other long term (current) drug therapy: Secondary | ICD-10-CM | POA: Diagnosis not present

## 2018-02-26 DIAGNOSIS — M8580 Other specified disorders of bone density and structure, unspecified site: Secondary | ICD-10-CM | POA: Diagnosis not present

## 2018-03-08 ENCOUNTER — Other Ambulatory Visit (HOSPITAL_COMMUNITY): Payer: Self-pay | Admitting: Internal Medicine

## 2018-03-08 DIAGNOSIS — Z1231 Encounter for screening mammogram for malignant neoplasm of breast: Secondary | ICD-10-CM

## 2018-03-12 DIAGNOSIS — N390 Urinary tract infection, site not specified: Secondary | ICD-10-CM | POA: Diagnosis not present

## 2018-03-15 ENCOUNTER — Encounter (HOSPITAL_COMMUNITY): Payer: Self-pay

## 2018-03-15 ENCOUNTER — Ambulatory Visit (HOSPITAL_COMMUNITY)
Admission: RE | Admit: 2018-03-15 | Discharge: 2018-03-15 | Disposition: A | Discharge status: Home / Self Care | Payer: Medicare Other | Source: Ambulatory Visit | Attending: Internal Medicine | Admitting: Internal Medicine

## 2018-03-15 DIAGNOSIS — Z1231 Encounter for screening mammogram for malignant neoplasm of breast: Secondary | ICD-10-CM

## 2018-03-27 DIAGNOSIS — R350 Frequency of micturition: Secondary | ICD-10-CM | POA: Diagnosis not present

## 2018-04-04 DIAGNOSIS — M0589 Other rheumatoid arthritis with rheumatoid factor of multiple sites: Secondary | ICD-10-CM | POA: Diagnosis not present

## 2018-04-12 DIAGNOSIS — Z96641 Presence of right artificial hip joint: Secondary | ICD-10-CM | POA: Diagnosis not present

## 2018-04-12 DIAGNOSIS — Z96651 Presence of right artificial knee joint: Secondary | ICD-10-CM | POA: Diagnosis not present

## 2018-04-12 DIAGNOSIS — Z96642 Presence of left artificial hip joint: Secondary | ICD-10-CM | POA: Diagnosis not present

## 2018-05-30 DIAGNOSIS — M0589 Other rheumatoid arthritis with rheumatoid factor of multiple sites: Secondary | ICD-10-CM | POA: Diagnosis not present

## 2018-05-30 DIAGNOSIS — Z79899 Other long term (current) drug therapy: Secondary | ICD-10-CM | POA: Diagnosis not present

## 2018-07-10 DIAGNOSIS — E669 Obesity, unspecified: Secondary | ICD-10-CM | POA: Diagnosis not present

## 2018-07-10 DIAGNOSIS — I7300 Raynaud's syndrome without gangrene: Secondary | ICD-10-CM | POA: Diagnosis not present

## 2018-07-10 DIAGNOSIS — M0589 Other rheumatoid arthritis with rheumatoid factor of multiple sites: Secondary | ICD-10-CM | POA: Diagnosis not present

## 2018-07-10 DIAGNOSIS — D61818 Other pancytopenia: Secondary | ICD-10-CM | POA: Diagnosis not present

## 2018-07-10 DIAGNOSIS — Z6834 Body mass index (BMI) 34.0-34.9, adult: Secondary | ICD-10-CM | POA: Diagnosis not present

## 2018-07-10 DIAGNOSIS — M8580 Other specified disorders of bone density and structure, unspecified site: Secondary | ICD-10-CM | POA: Diagnosis not present

## 2018-07-10 DIAGNOSIS — M150 Primary generalized (osteo)arthritis: Secondary | ICD-10-CM | POA: Diagnosis not present

## 2018-07-10 DIAGNOSIS — Z79899 Other long term (current) drug therapy: Secondary | ICD-10-CM | POA: Diagnosis not present

## 2018-07-10 DIAGNOSIS — R5383 Other fatigue: Secondary | ICD-10-CM | POA: Diagnosis not present

## 2018-07-10 DIAGNOSIS — M810 Age-related osteoporosis without current pathological fracture: Secondary | ICD-10-CM | POA: Diagnosis not present

## 2018-07-26 DIAGNOSIS — M0589 Other rheumatoid arthritis with rheumatoid factor of multiple sites: Secondary | ICD-10-CM | POA: Diagnosis not present

## 2018-08-01 DIAGNOSIS — H26491 Other secondary cataract, right eye: Secondary | ICD-10-CM | POA: Diagnosis not present

## 2018-08-10 DIAGNOSIS — H26491 Other secondary cataract, right eye: Secondary | ICD-10-CM | POA: Diagnosis not present

## 2018-08-10 DIAGNOSIS — H26492 Other secondary cataract, left eye: Secondary | ICD-10-CM | POA: Diagnosis not present

## 2018-08-11 DIAGNOSIS — M810 Age-related osteoporosis without current pathological fracture: Secondary | ICD-10-CM | POA: Diagnosis not present

## 2018-08-11 DIAGNOSIS — R42 Dizziness and giddiness: Secondary | ICD-10-CM | POA: Diagnosis not present

## 2018-08-11 DIAGNOSIS — Z0001 Encounter for general adult medical examination with abnormal findings: Secondary | ICD-10-CM | POA: Diagnosis not present

## 2018-08-11 DIAGNOSIS — Z79899 Other long term (current) drug therapy: Secondary | ICD-10-CM | POA: Diagnosis not present

## 2018-08-11 DIAGNOSIS — R350 Frequency of micturition: Secondary | ICD-10-CM | POA: Diagnosis not present

## 2018-08-11 DIAGNOSIS — R739 Hyperglycemia, unspecified: Secondary | ICD-10-CM | POA: Diagnosis not present

## 2018-08-14 DIAGNOSIS — Z0001 Encounter for general adult medical examination with abnormal findings: Secondary | ICD-10-CM | POA: Diagnosis not present

## 2018-08-14 DIAGNOSIS — Z79899 Other long term (current) drug therapy: Secondary | ICD-10-CM | POA: Diagnosis not present

## 2018-08-14 DIAGNOSIS — R42 Dizziness and giddiness: Secondary | ICD-10-CM | POA: Diagnosis not present

## 2018-08-14 DIAGNOSIS — K219 Gastro-esophageal reflux disease without esophagitis: Secondary | ICD-10-CM | POA: Diagnosis not present

## 2018-08-14 DIAGNOSIS — N3281 Overactive bladder: Secondary | ICD-10-CM | POA: Diagnosis not present

## 2018-08-14 DIAGNOSIS — M25512 Pain in left shoulder: Secondary | ICD-10-CM | POA: Diagnosis not present

## 2018-08-14 DIAGNOSIS — Z6833 Body mass index (BMI) 33.0-33.9, adult: Secondary | ICD-10-CM | POA: Diagnosis not present

## 2018-08-14 DIAGNOSIS — E668 Other obesity: Secondary | ICD-10-CM | POA: Diagnosis not present

## 2018-08-27 DIAGNOSIS — Z23 Encounter for immunization: Secondary | ICD-10-CM | POA: Diagnosis not present

## 2018-09-11 DIAGNOSIS — J22 Unspecified acute lower respiratory infection: Secondary | ICD-10-CM | POA: Diagnosis not present

## 2018-09-19 DIAGNOSIS — M2011 Hallux valgus (acquired), right foot: Secondary | ICD-10-CM | POA: Diagnosis not present

## 2018-09-19 DIAGNOSIS — M25571 Pain in right ankle and joints of right foot: Secondary | ICD-10-CM | POA: Diagnosis not present

## 2018-09-19 DIAGNOSIS — M25572 Pain in left ankle and joints of left foot: Secondary | ICD-10-CM | POA: Diagnosis not present

## 2018-09-19 DIAGNOSIS — M2012 Hallux valgus (acquired), left foot: Secondary | ICD-10-CM | POA: Diagnosis not present

## 2018-10-18 DIAGNOSIS — M79674 Pain in right toe(s): Secondary | ICD-10-CM | POA: Diagnosis not present

## 2018-10-18 DIAGNOSIS — M2012 Hallux valgus (acquired), left foot: Secondary | ICD-10-CM | POA: Diagnosis not present

## 2018-10-18 DIAGNOSIS — M25571 Pain in right ankle and joints of right foot: Secondary | ICD-10-CM | POA: Diagnosis not present

## 2018-10-18 DIAGNOSIS — M25572 Pain in left ankle and joints of left foot: Secondary | ICD-10-CM | POA: Diagnosis not present

## 2018-10-18 DIAGNOSIS — M2011 Hallux valgus (acquired), right foot: Secondary | ICD-10-CM | POA: Diagnosis not present

## 2018-10-18 DIAGNOSIS — M2041 Other hammer toe(s) (acquired), right foot: Secondary | ICD-10-CM | POA: Diagnosis not present

## 2018-11-08 ENCOUNTER — Other Ambulatory Visit (HOSPITAL_COMMUNITY): Payer: Self-pay | Admitting: *Deleted

## 2018-11-08 DIAGNOSIS — D61818 Other pancytopenia: Secondary | ICD-10-CM

## 2018-11-09 ENCOUNTER — Other Ambulatory Visit (HOSPITAL_COMMUNITY): Payer: Self-pay

## 2018-11-16 ENCOUNTER — Ambulatory Visit (HOSPITAL_COMMUNITY): Payer: Self-pay | Admitting: Hematology

## 2018-11-16 ENCOUNTER — Ambulatory Visit (HOSPITAL_COMMUNITY): Payer: Self-pay | Admitting: Adult Health

## 2019-05-02 ENCOUNTER — Other Ambulatory Visit (HOSPITAL_COMMUNITY): Payer: Self-pay | Admitting: Internal Medicine

## 2019-05-02 DIAGNOSIS — Z1231 Encounter for screening mammogram for malignant neoplasm of breast: Secondary | ICD-10-CM
